# Patient Record
Sex: Female | Born: 1985 | Race: Black or African American | Hispanic: No | Marital: Married | Smoking: Never smoker
Health system: Southern US, Community
[De-identification: ages and names within clinical notes are randomized; demographics above are authoritative.]

## PROBLEM LIST (undated history)

## (undated) ENCOUNTER — Inpatient Hospital Stay (HOSPITAL_COMMUNITY): Payer: Self-pay

## (undated) ENCOUNTER — Inpatient Hospital Stay (HOSPITAL_COMMUNITY): Admission: RE | Payer: 59 | Source: Ambulatory Visit

## (undated) DIAGNOSIS — R569 Unspecified convulsions: Secondary | ICD-10-CM

## (undated) DIAGNOSIS — R87629 Unspecified abnormal cytological findings in specimens from vagina: Secondary | ICD-10-CM

## (undated) DIAGNOSIS — T7840XA Allergy, unspecified, initial encounter: Secondary | ICD-10-CM

## (undated) DIAGNOSIS — G43909 Migraine, unspecified, not intractable, without status migrainosus: Secondary | ICD-10-CM

## (undated) DIAGNOSIS — Q282 Arteriovenous malformation of cerebral vessels: Secondary | ICD-10-CM

## (undated) HISTORY — DX: Allergy, unspecified, initial encounter: T78.40XA

## (undated) HISTORY — DX: Migraine, unspecified, not intractable, without status migrainosus: G43.909

## (undated) HISTORY — DX: Unspecified convulsions: R56.9

## (undated) HISTORY — DX: Unspecified abnormal cytological findings in specimens from vagina: R87.629

---
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
## 2007-11-06 ENCOUNTER — Ambulatory Visit (HOSPITAL_COMMUNITY): Admission: RE | Admit: 2007-11-06 | Discharge: 2007-11-06 | Payer: Self-pay | Admitting: Obstetrics

## 2008-03-14 ENCOUNTER — Inpatient Hospital Stay (HOSPITAL_COMMUNITY): Admission: AD | Admit: 2008-03-14 | Discharge: 2008-03-14 | Payer: Self-pay | Admitting: Obstetrics & Gynecology

## 2008-03-19 ENCOUNTER — Inpatient Hospital Stay (HOSPITAL_COMMUNITY): Admission: AD | Admit: 2008-03-19 | Discharge: 2008-03-19 | Payer: Self-pay | Admitting: Obstetrics

## 2008-04-06 ENCOUNTER — Inpatient Hospital Stay (HOSPITAL_COMMUNITY): Admission: AD | Admit: 2008-04-06 | Discharge: 2008-04-06 | Payer: Self-pay | Admitting: Obstetrics & Gynecology

## 2008-04-06 ENCOUNTER — Inpatient Hospital Stay (HOSPITAL_COMMUNITY): Admission: AD | Admit: 2008-04-06 | Discharge: 2008-04-10 | Payer: Self-pay | Admitting: Obstetrics

## 2008-04-06 ENCOUNTER — Inpatient Hospital Stay (HOSPITAL_COMMUNITY): Admission: AD | Admit: 2008-04-06 | Discharge: 2008-04-06 | Payer: Self-pay | Admitting: Obstetrics

## 2008-04-07 ENCOUNTER — Encounter: Payer: Self-pay | Admitting: Obstetrics

## 2009-05-20 ENCOUNTER — Emergency Department (HOSPITAL_COMMUNITY): Admission: EM | Admit: 2009-05-20 | Discharge: 2009-05-20 | Payer: Self-pay | Admitting: Family Medicine

## 2009-07-10 LAB — HM DIABETES EYE EXAM

## 2010-07-10 HISTORY — PX: TOOTH EXTRACTION: SUR596

## 2010-11-22 NOTE — Discharge Summary (Signed)
NAMEBRIGITTA, Sherry Sheppard            ACCOUNT NO.:  [id number]   MEDICAL RECORD NO.:  [id number]          PATIENT TYPE:  INP   LOCATION:  9126                          FACILITY:  [facility]   PHYSICIAN:  Charles A. Clearance Coots, M.D.DATE OF BIRTH:  [birth year]   DATE OF ADMISSION:  04/06/2008  DATE OF DISCHARGE:  04/10/2008                               DISCHARGE SUMMARY   ADMITTING DIAGNOSIS:  Thirty-nine weeks' gestation, active labor.   DISCHARGE DIAGNOSES:  1. Thirty-nine weeks' gestation, active labor.  2. Status post primary low transverse cesarean section on April 07, 2008, for arrest of descent.  Viable female infant was delivered      at 1401, Apgars of 2 at 1 minute and 9 at 5 minutes, weight of 3210      g, and length of 49.53 cm.  Mother and infant discharged to home in      good condition.   REASON FOR ADMISSION:  A 25 year old G1 black female, estimated date of  confinement of April 08, 2008, presents with uterine contractions.  Prenatal care was uncomplicated.   PAST MEDICAL HISTORY:  Surgery:  None.  Illnesses:  None.   MEDICATIONS:  Prenatal vitamins.   ALLERGIES:  No known drug allergies.   SOCIAL HISTORY:  Single.  Negative tobacco, alcohol, or recreational  drug use.   FAMILY HISTORY:  Positive for diabetes and hypertension.   PHYSICAL EXAMINATION:  GENERAL:  Well-nourished, well-developed female  in no acute distress.  LUNGS:  Clear to auscultation bilaterally.  HEART:  Regular rate and rhythm.  ABDOMEN:  Gravid, nontender.  Cervix 3 cm dilated, 100% effaced, and  vertex at -2 station.   ADMITTING LABORATORY DATA:  Hemoglobin 12.3, hematocrit 37.9, white  blood cell count 14,200, platelets 281,000.  RPR is nonreactive.   HOSPITAL COURSE:  The patient was admitted and progressed to 7-cm  dilatation with an increasingly swollen anterior lip of the cervix.  There was no further descent of the vertex for greater than 3 hours  after being in a zero station.   Decision was made to proceed with  cesarean section delivery for arrest of descent.  Primary low transverse  cesarean section was performed on April 08, 1999.  There were no  intraoperative complications.  Postoperative and postpartum course was  uncomplicated.  The patient was discharged to home on postop day #3 in  good condition.   DISCHARGE LABORATORY DATA:  Hemoglobin 9.3, hematocrit 28.7, white blood  cell count 20,000, platelets 221,000.   DISCHARGE DISPOSITION:  Medications; continue prenatal vitamins.  Ibuprofen and Percocet was prescribed for pain.  Routine written  instructions were given for discharge after cesarean section.  The  patient is to call office for followup appointment in 2 weeks.      Charles A. Clearance Coots, M.D.  Electronically Signed     CAH/MEDQ  D:  04/10/2008  T:  04/10/2008  Job:  [id number]

## 2010-11-22 NOTE — Op Note (Signed)
NAMEANNALIZA, ZIA            ACCOUNT NO.:  [id number]   MEDICAL RECORD NO.:  [id number]          PATIENT TYPE:  INP   LOCATION:                                FACILITY:  [facility]   PHYSICIAN:  Charles A. Clearance Coots, M.D.DATE OF BIRTH:  04-09-1986   DATE OF PROCEDURE:  04/07/2008  DATE OF DISCHARGE:                               OPERATIVE REPORT   PREOPERATIVE DIAGNOSIS:  Arrest of descent.   POSTOPERATIVE DIAGNOSIS:  Arrest of descent.   PROCEDURE:  Primary low transverse cesarean section.   SURGEON:  Charles A. Clearance Coots, MD   ASSISTANT:  Corey Skains, surgical technician.   ANESTHESIA:  Epidural.   ESTIMATED BLOOD LOSS:  800 mL.   IV FLUIDS:  2000 mL.   URINE OUTPUT:  400 mL clear.   COMPLICATIONS:  None.   Foley to gravity.   FINDINGS:  Viable female at 1401, Apgars of 2 at 1 minute and 9 at 5  minutes, weight of 7 pounds and 1 ounce.  Normal uterus, ovaries, and  fallopian tube.  Cord pH of 7.21.   SPECIMEN:  Placenta to pathology.   OPERATION:  The patient was brought to the operating room, and after  satisfactory re-dosing of the epidural, the abdomen was prepped and  draped in the usual sterile fashion.  Pfannenstiel skin incision was  made with a scalpel that was deepened down to the fascia with a scalpel.  The fascia was nicked in the midline, and the fascial incision was  extended to the left and to the right with curved Mayo scissors.  The  superior and inferior fascial edges were taken off the rectus muscle  with blunt and sharp dissection.  The rectus muscle was bluntly divided  in the midline.  Peritoneum was entered digitally and was digitally  extended to the left and to the right.  The bladder blade was positioned  in the incision.  The vesicouterine fold of peritoneum above the  reflection of the urinary bladder was grasped with forceps and was  incised and undermined with Metzenbaum scissors.  The incision was  extended to the left and to the right with  the Metzenbaum scissors.  The  bladder flap was bluntly developed, and the bladder blade was  repositioned in front of the urinary bladder placing it well out of the  operative field.  The uterus was then entered transversely in the lower  uterine segment with the scalpel.  The uterine incision was extended to  the left and to the right with the digital traction.  The vertex was  noted to be left occiput posterior, and with assistance from the nurse  pushing from below, the occiput was rotated to an anterior position, and  the vertex was then delivered with the aid of fundal pressure from the  assistant.  The infant's mouth and nose were suctioned with a suction  bulb, and the delivery was completed with the aid of fundal pressure  from the assistant.  The umbilical cord was doubly clamped and cut, and  the infant was handed off to the nursery staff.  Cord pH was  obtained.  Placenta was then manually removed from the uterus intact.  The  endometrial surface was thoroughly debrided with a dry lap sponge.  The  edges of the uterine incision were grasped with ring forceps, and the  uterus was closed with a continuous interlocking suture of 0 Monocryl  from each corner to the center.  Hemostasis was excellent.  The pelvic  cavity was then thoroughly irrigated with warm saline solution and all  clots were removed.  The abdomen was then closed as follows:  The  parietal peritoneum was closed with continuous suture of 2-0 Monocryl.  The fascia was closed with a continuous suture of 0 Vicryl.  Subcutaneous tissue was thoroughly irrigated with warm saline solution.  All areas of subcutaneous bleeding were coagulated with the Bovie.  The  skin was then approximated with surgical stainless steel staples.  Sterile bandage was applied to the incision closure.  Surgical  technician indicated that all needle, sponge, and instrument counts were  correct x2.  The patient tolerated the procedure well, was  transported  to recovery in satisfactory condition.      Charles A. Clearance Coots, M.D.  Electronically Signed     CAH/MEDQ  D:  04/07/2008  T:  04/08/2008  Job:  [id number]

## 2011-04-10 LAB — CBC
Hemoglobin: 9.3 — ABNORMAL LOW
Platelets: 281
RBC: 3.33 — ABNORMAL LOW
RDW: 13.5
WBC: 14.2 — ABNORMAL HIGH
WBC: 20.1 — ABNORMAL HIGH

## 2011-04-10 LAB — RPR: RPR Ser Ql: NONREACTIVE

## 2011-06-08 LAB — HM PAP SMEAR: HM Pap smear: NORMAL

## 2011-06-13 ENCOUNTER — Ambulatory Visit: Payer: Self-pay | Admitting: Internal Medicine

## 2011-06-29 ENCOUNTER — Other Ambulatory Visit (INDEPENDENT_AMBULATORY_CARE_PROVIDER_SITE_OTHER): Payer: 59

## 2011-06-29 ENCOUNTER — Ambulatory Visit (INDEPENDENT_AMBULATORY_CARE_PROVIDER_SITE_OTHER): Payer: 59 | Admitting: Internal Medicine

## 2011-06-29 ENCOUNTER — Encounter: Payer: Self-pay | Admitting: Internal Medicine

## 2011-06-29 VITALS — BP 98/72 | HR 79 | Temp 98.7°F | Ht 65.0 in | Wt 121.4 lb

## 2011-06-29 DIAGNOSIS — Z0000 Encounter for general adult medical examination without abnormal findings: Secondary | ICD-10-CM

## 2011-06-29 DIAGNOSIS — Z23 Encounter for immunization: Secondary | ICD-10-CM

## 2011-06-29 LAB — HEPATIC FUNCTION PANEL
ALT: 25 U/L (ref 0–35)
AST: 28 U/L (ref 0–37)
Albumin: 4.1 g/dL (ref 3.5–5.2)
Alkaline Phosphatase: 51 U/L (ref 39–117)
Total Bilirubin: 1.1 mg/dL (ref 0.3–1.2)

## 2011-06-29 LAB — URINALYSIS
Bilirubin Urine: NEGATIVE
Hgb urine dipstick: NEGATIVE
Ketones, ur: NEGATIVE
Total Protein, Urine: NEGATIVE
Urine Glucose: NEGATIVE

## 2011-06-29 LAB — CBC WITH DIFFERENTIAL/PLATELET
Basophils Relative: 0.4 % (ref 0.0–3.0)
Eosinophils Relative: 3.1 % (ref 0.0–5.0)
HCT: 38.2 % (ref 36.0–46.0)
Lymphs Abs: 3 10*3/uL (ref 0.7–4.0)
MCV: 85.4 fl (ref 78.0–100.0)
Monocytes Absolute: 0.3 10*3/uL (ref 0.1–1.0)
Platelets: 300 10*3/uL (ref 150.0–400.0)
RBC: 4.47 Mil/uL (ref 3.87–5.11)
WBC: 7.3 10*3/uL (ref 4.5–10.5)

## 2011-06-29 LAB — TSH: TSH: 0.65 u[IU]/mL (ref 0.35–5.50)

## 2011-06-29 LAB — BASIC METABOLIC PANEL
CO2: 28 mEq/L (ref 19–32)
Glucose, Bld: 77 mg/dL (ref 70–99)
Sodium: 140 mEq/L (ref 135–145)

## 2011-06-29 LAB — LIPID PANEL
HDL: 78.3 mg/dL (ref >39.00)
Triglycerides: 54 mg/dL (ref 0.0–149.0)

## 2011-06-29 NOTE — Progress Notes (Signed)
  Subjective:    Patient ID: Sherry Sheppard, female    DOB: 1985/09/16, 25 y.o.   MRN: [id number]  HPI New pt to me, here to establish care; Also patient is here today for annual physical. Patient feels well and has no complaints.  Past Medical History  Diagnosis Date  . Allergy   . UTI (urinary tract infection)    Family History  Problem Relation Age of Onset  . Hyperlipidemia Mother   . Lung cancer Maternal Grandmother   . Hypertension Other   . Diabetes Other    History  Substance Use Topics  . Smoking status: Never Smoker   . Smokeless tobacco: Not on file  . Alcohol Use: No    Review of Systems Constitutional: Negative for fever or weight change.  Respiratory: Negative for cough and shortness of breath.   Cardiovascular: Negative for chest pain or palpitations.  Gastrointestinal: Negative for abdominal pain, no bowel changes.  Musculoskeletal: Negative for gait problem or joint swelling.  Skin: Negative for rash.  Neurological: Negative for dizziness or headache.  No other specific complaints in a complete review of systems (except as listed in HPI above).    Objective:   Physical Exam BP 98/72  Pulse 79  Temp(Src) 98.7 F (37.1 C) (Oral)  Ht 5\' 5"  (1.651 m)  Wt 121 lb 6.4 oz (55.067 kg)  BMI 20.20 kg/m2  SpO2 98% Wt Readings from Last 3 Encounters:  06/29/11 121 lb 6.4 oz (55.067 kg)   Constitutional: She appears well-developed and well-nourished. No distress.  HENT: Head: Normocephalic and atraumatic. Ears: B TMs ok, no erythema or effusion; Nose: Nose normal.  Mouth/Throat: Oropharynx is clear and moist. No oropharyngeal exudate.  Eyes: Conjunctivae and EOM are normal. Pupils are equal, round, and reactive to light. No scleral icterus.  Neck: Normal range of motion. Neck supple. No JVD present. No thyromegaly present.  Cardiovascular: Normal rate, regular rhythm and normal heart sounds.  No murmur heard. No BLE edema. Pulmonary/Chest: Effort normal and  breath sounds normal. No respiratory distress. She has no wheezes.  Abdominal: Soft. Bowel sounds are normal. She exhibits no distension. There is no tenderness. no masses Musculoskeletal: Normal range of motion, no joint effusions. No gross deformities Neurological: She is alert and oriented to person, place, and time. No cranial nerve deficit. Coordination normal.  Skin: Skin is warm and dry. No rash noted. No erythema.  Psychiatric: She has a normal mood and affect. Her behavior is normal. Judgment and thought content normal.   Lab Results  Component Value Date   WBC 20.1* 04/08/2008   HGB 9.3 DELTA CHECK NOTED* 04/08/2008   HCT 28.7* 04/08/2008   PLT 221 04/08/2008       Assessment & Plan:  CPX - v70.0 - Patient has been counseled on age-appropriate routine health concerns for screening and prevention. These are reviewed and up-to-date. Immunizations are up-to-date or declined. Labs ordered and will be reviewed.

## 2011-06-29 NOTE — Patient Instructions (Addendum)
It was good to see you today. We have reviewed your prior medical history today Health Maintenance reviewed - up to date after Gardisil.  Test(s) ordered today. Your results will be called to you after review (48-72hours after test completion). If any changes need to be made, you will be notified at that time. Start Gardisil series today - 1st of 3 shots done Please schedule followup in 1-2 years, call sooner if problems.

## 2011-08-28 ENCOUNTER — Ambulatory Visit: Payer: 59

## 2011-09-13 ENCOUNTER — Other Ambulatory Visit: Payer: Self-pay | Admitting: Obstetrics

## 2011-09-13 ENCOUNTER — Encounter (HOSPITAL_COMMUNITY): Payer: Self-pay

## 2011-09-13 ENCOUNTER — Ambulatory Visit (HOSPITAL_COMMUNITY)
Admission: RE | Admit: 2011-09-13 | Discharge: 2011-09-13 | Disposition: A | Discharge status: Home / Self Care | Payer: 59 | Source: Ambulatory Visit | Attending: Obstetrics | Admitting: Obstetrics

## 2011-09-13 DIAGNOSIS — O3680X0 Pregnancy with inconclusive fetal viability, not applicable or unspecified: Secondary | ICD-10-CM

## 2011-09-13 DIAGNOSIS — O368390 Maternal care for abnormalities of the fetal heart rate or rhythm, unspecified trimester, not applicable or unspecified: Secondary | ICD-10-CM | POA: Insufficient documentation

## 2011-09-14 ENCOUNTER — Ambulatory Visit (HOSPITAL_COMMUNITY): Payer: 59

## 2011-09-19 ENCOUNTER — Encounter (HOSPITAL_COMMUNITY): Payer: Self-pay | Admitting: *Deleted

## 2011-09-19 ENCOUNTER — Other Ambulatory Visit: Payer: Self-pay | Admitting: Obstetrics

## 2011-09-19 MED ORDER — DOXYCYCLINE HYCLATE 100 MG IV SOLR: 200.0000 mg | Freq: Once | INTRAVENOUS | Status: DC

## 2011-09-20 ENCOUNTER — Encounter (HOSPITAL_COMMUNITY)
Admission: AD | Disposition: A | Discharge status: Home / Self Care | Payer: Self-pay | Source: Ambulatory Visit | Attending: Obstetrics

## 2011-09-20 ENCOUNTER — Encounter (HOSPITAL_COMMUNITY): Payer: Self-pay | Admitting: Anesthesiology

## 2011-09-20 ENCOUNTER — Inpatient Hospital Stay (HOSPITAL_COMMUNITY): Payer: 59 | Admitting: Anesthesiology

## 2011-09-20 ENCOUNTER — Encounter (HOSPITAL_COMMUNITY): Payer: Self-pay | Admitting: *Deleted

## 2011-09-20 ENCOUNTER — Ambulatory Visit (HOSPITAL_COMMUNITY)
Admission: AD | Admit: 2011-09-20 | Discharge: 2011-09-20 | DRG: 770 | Disposition: A | Discharge status: Home / Self Care | Payer: 59 | Source: Ambulatory Visit | Attending: Obstetrics | Admitting: Obstetrics

## 2011-09-20 ENCOUNTER — Ambulatory Visit (HOSPITAL_COMMUNITY): Admission: RE | Admit: 2011-09-20 | Payer: 59 | Source: Ambulatory Visit | Admitting: Obstetrics & Gynecology

## 2011-09-20 DIAGNOSIS — O021 Missed abortion: Secondary | ICD-10-CM | POA: Diagnosis present

## 2011-09-20 HISTORY — PX: DILATION AND EVACUATION: SHX1459

## 2011-09-20 LAB — ABO/RH: ABO/RH(D): A POS

## 2011-09-20 LAB — CBC
Hemoglobin: 12 g/dL (ref 12.0–15.0)
MCH: 27.6 pg (ref 26.0–34.0)
RBC: 4.35 MIL/uL (ref 3.87–5.11)
WBC: 14.1 10*3/uL — ABNORMAL HIGH (ref 4.0–10.5)

## 2011-09-20 SURGERY — DILATION AND EVACUATION, UTERUS
Anesthesia: General | Site: Uterus | Wound class: Clean Contaminated

## 2011-09-20 MED ORDER — MIDAZOLAM HCL 2 MG/2ML IJ SOLN
INTRAMUSCULAR | Status: AC
  Filled 2011-09-20: qty 2

## 2011-09-20 MED ORDER — LIDOCAINE HCL 1 % IJ SOLN
INTRAMUSCULAR | Status: DC | PRN
  Administered 2011-09-20: 10 mL

## 2011-09-20 MED ORDER — LACTATED RINGERS IV SOLN
INTRAVENOUS | Status: DC
  Administered 2011-09-20: 125 mL/h via INTRAVENOUS
  Administered 2011-09-20: 12:00:00 via INTRAVENOUS

## 2011-09-20 MED ORDER — SUCCINYLCHOLINE CHLORIDE 20 MG/ML IJ SOLN
INTRAMUSCULAR | Status: DC | PRN
  Administered 2011-09-20: 120 mg via INTRAVENOUS

## 2011-09-20 MED ORDER — DEXAMETHASONE SODIUM PHOSPHATE 10 MG/ML IJ SOLN
INTRAMUSCULAR | Status: AC
  Filled 2011-09-20: qty 1

## 2011-09-20 MED ORDER — DEXAMETHASONE SODIUM PHOSPHATE 10 MG/ML IJ SOLN
INTRAMUSCULAR | Status: DC | PRN
  Administered 2011-09-20: 10 mg via INTRAVENOUS

## 2011-09-20 MED ORDER — FENTANYL CITRATE 0.05 MG/ML IJ SOLN
INTRAMUSCULAR | Status: AC
  Filled 2011-09-20: qty 2

## 2011-09-20 MED ORDER — HYDROMORPHONE HCL PF 1 MG/ML IJ SOLN
2.0000 mg | Freq: Once | INTRAMUSCULAR | Status: AC
  Administered 2011-09-20: 2 mg via INTRAVENOUS
  Filled 2011-09-20: qty 2

## 2011-09-20 MED ORDER — METHYLERGONOVINE MALEATE 0.2 MG PO TABS: 0.2000 mg | ORAL_TABLET | Freq: Four times a day (QID) | ORAL | Status: DC

## 2011-09-20 MED ORDER — MEPERIDINE HCL 25 MG/ML IJ SOLN: 6.2500 mg | INTRAMUSCULAR | Status: DC | PRN

## 2011-09-20 MED ORDER — CEFAZOLIN SODIUM 1-5 GM-% IV SOLN
INTRAVENOUS | Status: DC | PRN
  Administered 2011-09-20: 2 g via INTRAVENOUS

## 2011-09-20 MED ORDER — MIDAZOLAM HCL 5 MG/5ML IJ SOLN
INTRAMUSCULAR | Status: DC | PRN
  Administered 2011-09-20: 2 mg via INTRAVENOUS

## 2011-09-20 MED ORDER — FENTANYL CITRATE 0.05 MG/ML IJ SOLN
INTRAMUSCULAR | Status: AC
Start: 2011-09-20 — End: 2011-09-20
  Filled 2011-09-20: qty 10

## 2011-09-20 MED ORDER — SODIUM BICARBONATE 8.4 % IV SOLN
INTRAVENOUS | Status: DC | PRN
  Administered 2011-09-20: 2 mL via INTRAVENOUS

## 2011-09-20 MED ORDER — PROPOFOL 10 MG/ML IV EMUL
INTRAVENOUS | Status: AC
  Filled 2011-09-20: qty 40

## 2011-09-20 MED ORDER — KETOROLAC TROMETHAMINE 30 MG/ML IJ SOLN: 15.0000 mg | Freq: Once | INTRAMUSCULAR | Status: DC | PRN

## 2011-09-20 MED ORDER — ONDANSETRON HCL 4 MG/2ML IJ SOLN
INTRAMUSCULAR | Status: AC
  Filled 2011-09-20: qty 2

## 2011-09-20 MED ORDER — CITRIC ACID-SODIUM CITRATE 334-500 MG/5ML PO SOLN
30.0000 mL | Freq: Once | ORAL | Status: AC
  Administered 2011-09-20: 30 mL via ORAL
  Filled 2011-09-20: qty 15

## 2011-09-20 MED ORDER — LIDOCAINE HCL (CARDIAC) 20 MG/ML IV SOLN
INTRAVENOUS | Status: DC | PRN
  Administered 2011-09-20: 40 mg via INTRAVENOUS

## 2011-09-20 MED ORDER — HYDROCODONE-ACETAMINOPHEN 7.5-750 MG PO TABS: 1.0000 | ORAL_TABLET | Freq: Four times a day (QID) | ORAL | Status: DC | PRN

## 2011-09-20 MED ORDER — OXYTOCIN 20 UNITS IN LACTATED RINGERS INFUSION - SIMPLE
INTRAVENOUS | Status: DC | PRN
  Administered 2011-09-20: 40 [IU] via INTRAVENOUS

## 2011-09-20 MED ORDER — IBUPROFEN 800 MG PO TABS: 800.0000 mg | ORAL_TABLET | Freq: Three times a day (TID) | ORAL | Status: AC | PRN

## 2011-09-20 MED ORDER — HYDROMORPHONE HCL PF 1 MG/ML IJ SOLN
1.0000 mg | Freq: Once | INTRAMUSCULAR | Status: AC
  Administered 2011-09-20: 1 mg via INTRAVENOUS
  Filled 2011-09-20: qty 1

## 2011-09-20 MED ORDER — KETOROLAC TROMETHAMINE 60 MG/2ML IM SOLN
INTRAMUSCULAR | Status: AC
  Filled 2011-09-20: qty 2

## 2011-09-20 MED ORDER — FAMOTIDINE IN NACL 20-0.9 MG/50ML-% IV SOLN
20.0000 mg | Freq: Once | INTRAVENOUS | Status: AC
  Administered 2011-09-20: 20 mg via INTRAVENOUS
  Filled 2011-09-20: qty 50

## 2011-09-20 MED ORDER — PROMETHAZINE HCL 25 MG/ML IJ SOLN: 6.2500 mg | INTRAMUSCULAR | Status: DC | PRN

## 2011-09-20 MED ORDER — METHYLERGONOVINE MALEATE 0.2 MG/ML IJ SOLN
INTRAMUSCULAR | Status: DC | PRN
  Administered 2011-09-20: 0.2 mg via INTRAMUSCULAR

## 2011-09-20 MED ORDER — FENTANYL CITRATE 0.05 MG/ML IJ SOLN: 25.0000 ug | INTRAMUSCULAR | Status: DC | PRN

## 2011-09-20 MED ORDER — FENTANYL CITRATE 0.05 MG/ML IJ SOLN
INTRAMUSCULAR | Status: DC | PRN
  Administered 2011-09-20: 50 ug via INTRAVENOUS

## 2011-09-20 MED ORDER — PROPOFOL 10 MG/ML IV EMUL
INTRAVENOUS | Status: DC | PRN
  Administered 2011-09-20: 200 mg via INTRAVENOUS

## 2011-09-20 MED ORDER — LIDOCAINE HCL (CARDIAC) 20 MG/ML IV SOLN
INTRAVENOUS | Status: AC
  Filled 2011-09-20: qty 5

## 2011-09-20 MED ORDER — ONDANSETRON HCL 4 MG/2ML IJ SOLN
INTRAMUSCULAR | Status: DC | PRN
  Administered 2011-09-20: 4 mg via INTRAVENOUS

## 2011-09-20 SURGICAL SUPPLY — 22 items
CATH ROBINSON RED A/P 16FR (CATHETERS) ×2 IMPLANT
CLOTH BEACON ORANGE TIMEOUT ST (SAFETY) ×2 IMPLANT
DECANTER SPIKE VIAL GLASS SM (MISCELLANEOUS) ×1 IMPLANT
GLOVE BIO SURGEON STRL SZ8 (GLOVE) ×4 IMPLANT
GOWN PREVENTION PLUS LG XLONG (DISPOSABLE) ×4 IMPLANT
KIT BERKELEY 1ST TRIMESTER 3/8 (MISCELLANEOUS) ×2 IMPLANT
NDL HYPO 25X1 1.5 SAFETY (NEEDLE) ×1 IMPLANT
NDL SPNL 22GX3.5 QUINCKE BK (NEEDLE) ×1 IMPLANT
NEEDLE HYPO 25X1 1.5 SAFETY (NEEDLE) ×4 IMPLANT
NEEDLE SPNL 22GX3.5 QUINCKE BK (NEEDLE) ×2 IMPLANT
NS IRRIG 1000ML POUR BTL (IV SOLUTION) ×2 IMPLANT
PACK VAGINAL MINOR WOMEN LF (CUSTOM PROCEDURE TRAY) ×2 IMPLANT
PAD PREP 24X48 CUFFED NSTRL (MISCELLANEOUS) ×2 IMPLANT
SET BERKELEY SUCTION TUBING (SUCTIONS) ×2 IMPLANT
SYR 5ML LL (SYRINGE) ×1 IMPLANT
SYR CONTROL 10ML LL (SYRINGE) ×2 IMPLANT
TOWEL OR 17X24 6PK STRL BLUE (TOWEL DISPOSABLE) ×4 IMPLANT
VACURETTE 10 RIGID CVD (CANNULA) IMPLANT
VACURETTE 12 RIGID CVD (CANNULA) ×1 IMPLANT
VACURETTE 7MM CVD STRL WRAP (CANNULA) IMPLANT
VACURETTE 8 RIGID CVD (CANNULA) ×1 IMPLANT
VACURETTE 9 RIGID CVD (CANNULA) IMPLANT

## 2011-09-20 NOTE — Anesthesia Preprocedure Evaluation (Signed)
Anesthesia Evaluation  Patient identified by MRN, date of birth, ID band Patient awake    Reviewed: Allergy & Precautions, H&P , NPO status , Patient's Chart, lab work & pertinent test results  Airway Mallampati: I TM Distance: >3 FB Neck ROM: full    Dental No notable dental hx. (+) Teeth Intact   Pulmonary    Pulmonary exam normal       Cardiovascular negative cardio ROS      Neuro/Psych negative neurological ROS  negative psych ROS   GI/Hepatic negative GI ROS, Neg liver ROS,   Endo/Other  negative endocrine ROS  Renal/GU negative Renal ROS  negative genitourinary   Musculoskeletal negative musculoskeletal ROS (+)   Abdominal Normal abdominal exam  (+)   Peds negative pediatric ROS (+)  Hematology negative hematology ROS (+)   Anesthesia Other Findings   Reproductive/Obstetrics (+) Pregnancy                           Anesthesia Physical Anesthesia Plan  ASA: II  Anesthesia Plan: General   Post-op Pain Management:    Induction: Intravenous, Rapid sequence and Cricoid pressure planned  Airway Management Planned: Oral ETT  Additional Equipment:   Intra-op Plan:   Post-operative Plan: Extubation in OR  Informed Consent: I have reviewed the patients History and Physical, chart, labs and discussed the procedure including the risks, benefits and alternatives for the proposed anesthesia with the patient or authorized representative who has indicated his/her understanding and acceptance.   Dental advisory given  Plan Discussed with: CRNA and Surgeon  Anesthesia Plan Comments:         Anesthesia Quick Evaluation

## 2011-09-20 NOTE — Op Note (Signed)
Preoperative diagnosis:Missed Abortion, 1st Trimester  Postoperative diagnosis: same  Procedure: Suction dilatation and curretage Surgeon: Renard Caperton A  Anesthesia:Mac with paracervical block of 1% Xylocaine  Estimated blood loss:  Urine output: 50ml  IV Fluids:  Complications: None  Specimen: POC to Pathology.  Operative Findings: POC in dilated cervical os.  Description of procedure:   The patient was taken to the operating room and placed on the operating table in the semi-lithotomy position in Milroy stirrups.  Examination under anesthesia was performed.  The patient was prepped and draped in the usual manner.  After a time-out had been completed, a speculum was placed in the vagina.  The anterior lip of the cervix was grasped with a single-toothed tenaculum.  A paracervical block was performed using 20 ml of 1% lidocaine.  The block was performed at 4 and 8 o'clock at the cervical vaginal junction. The cervix was dilated with Shawnie Pons dilators.  A 8 -mm suction curet was inserted in the uterine cavity.  The device was activated and the curet rotated to evacuate the products of conception.   All the instruments were removed from the vagina.  Final instrument counts were correct.  The patient was taken to the PACU in stable condition.

## 2011-09-20 NOTE — H&P (Signed)
Sherry Sheppard is an 26 y.o. female. Presents for D&E for missed abortion.  Pertinent Gynecological History: Menses: regular every month without intermenstrual spotting Bleeding: none Contraception: none DES exposure: denies Blood transfusions: none Sexually transmitted diseases: no past history Previous GYN Procedures: none  Last mammogram: n/a Date: n/a Last pap: normal Date: 2012 OB History: G2, P1   Menstrual History: Menarche age: 31 No LMP recorded. Patient is pregnant.    Past Medical History  Diagnosis Date  . Allergic rhinitis   . UTI (urinary tract infection) 04/2011  . Recurrent upper respiratory infection (URI)     getting old cold sx - started 09/14/10  . Anemia     history     Past Surgical History  Procedure Date  . Cesarean section 2009  . Tooth extraction 2012    Family History  Problem Relation Age of Onset  . Hyperlipidemia Mother   . Lung cancer Maternal Grandmother 31  . Hypertension Other   . Diabetes Cousin     Social History:  reports that she has never smoked. She has never used smokeless tobacco. She reports that she does not drink alcohol or use illicit drugs.  Allergies: No Known Allergies  Prescriptions prior to admission  Medication Sig Dispense Refill  . Prenatal Vit-Fe Fumarate-FA (PRENATAL MULTIVITAMIN) TABS Take 1 tablet by mouth daily.        Review of Systems  Gastrointestinal: Positive for abdominal pain.  All other systems reviewed and are negative.    Blood pressure 99/84, pulse 85, temperature 97.1 F (36.2 C), temperature source Oral, resp. rate 18, SpO2 100.00%. Physical Exam  Nursing note and vitals reviewed. Constitutional: She is oriented to person, place, and time. She appears well-developed and well-nourished.  HENT:  Head: Normocephalic and atraumatic.  Eyes: Pupils are equal, round, and reactive to light.  Neck: Normal range of motion. Neck supple.  Cardiovascular: Normal rate and regular rhythm.     Respiratory: Effort normal and breath sounds normal.  GI: Soft.  Genitourinary: Vagina normal and uterus normal.  Musculoskeletal: Normal range of motion.  Neurological: She is alert and oriented to person, place, and time. She has normal reflexes.  Skin: Skin is warm and dry.  Psychiatric: She has a normal mood and affect. Her behavior is normal. Judgment and thought content normal.  Patient does have moderate vaginal bleeding and could not examine because of extreme abdominal pain.  Results for orders placed during the hospital encounter of 09/20/11 (from the past 24 hour(s))  CBC     Status: Abnormal   Collection Time   09/20/11 11:25 AM      Component Value Range   WBC 14.1 (*) 4.0 - 10.5 (K/uL)   RBC 4.35  3.87 - 5.11 (MIL/uL)   Hemoglobin 12.0  12.0 - 15.0 (g/dL)   HCT 40.9  81.1 - 91.4 (%)   MCV 85.3  78.0 - 100.0 (fL)   MCH 27.6  26.0 - 34.0 (pg)   MCHC 32.3  30.0 - 36.0 (g/dL)   RDW 78.2  95.6 - 21.3 (%)   Platelets 276  150 - 400 (K/uL)  ABO/RH     Status: Normal   Collection Time   09/20/11 11:25 AM      Component Value Range   ABO/RH(D) A POS      No results found.  Assessment/Plan: 8 week anembryonic intrauterine gestational sac on ultrasound.  Suction D&E for Missed Abortion. Muhammadali Ries A 09/20/2011, 1:03 PM

## 2011-09-20 NOTE — Discharge Instructions (Signed)
As per routine instructions for D&E.     DISCHARGE INSTRUCTIONS: D&C / D&E The following instructions have been prepared to help you care for yourself upon your return home.   Personal hygiene: Marland Kitchen Use sanitary pads for vaginal drainage, not tampons. . Shower the day after your procedure. . NO tub baths, pools or Jacuzzis for 2-3 weeks. . Wipe front to back after using the bathroom.  Activity and limitations: . Do NOT drive or operate any equipment for 24 hours. The effects of anesthesia are still present and drowsiness may result. . Do NOT rest in bed all day. . Walking is encouraged. . Walk up and down stairs slowly. . You may resume your normal activity in one to two days or as indicated by your physician.  Sexual activity: NO intercourse for at least 2 weeks after the procedure, or as indicated by your physician.  Diet: Eat a light meal as desired this evening. You may resume your usual diet tomorrow.  Return to work: You may resume your work activities in one to two days or as indicated by your doctor.  What to expect after your surgery: Expect to have vaginal bleeding/discharge for 2-3 days and spotting for up to 10 days. It is not unusual to have soreness for up to 1-2 weeks. You may have a slight burning sensation when you urinate for the first day. Mild cramps may continue for a couple of days. You may have a regular period in 2-6 weeks.  Call your doctor for any of the following: . Excessive vaginal bleeding, saturating and changing one pad every hour. . Inability to urinate 6 hours after discharge from hospital. . Pain not relieved by pain medication. . Fever of 100.4 F or greater. . Unusual vaginal discharge or odor.  Return to office ________________ Call for an appointment ___________________  Patient's signature: ______________________  Nurse's signature ________________________  Post Anesthesia Care Unit [phone]

## 2011-09-20 NOTE — Transfer of Care (Signed)
Immediate Anesthesia Transfer of Care Note  Patient: Sherry Sheppard  Procedure(s) Performed: Procedure(s) (LRB): DILATATION AND EVACUATION (N/A)  Patient Location: PACU  Anesthesia Type: General  Level of Consciousness: awake, alert  and oriented  Airway & Oxygen Therapy: Patient Spontanous Breathing and Patient connected to nasal cannula oxygen  Post-op Assessment: Report given to PACU RN and Post -op Vital signs reviewed and stable  Post vital signs: stable  Complications: No apparent anesthesia complications

## 2011-09-20 NOTE — Anesthesia Postprocedure Evaluation (Signed)
Anesthesia Post Note  Patient: Sherry Sheppard  Procedure(s) Performed: Procedure(s) (LRB): DILATATION AND EVACUATION (N/A)  Anesthesia type: GA  Patient location: PACU  Post pain: Pain level controlled  Post assessment: Post-op Vital signs reviewed  Last Vitals:  Filed Vitals:   09/20/11 1500  BP:   Pulse: 65  Temp: 36.6 C  Resp: 13    Post vital signs: Reviewed  Level of consciousness: sedated  Complications: No apparent anesthesia complications

## 2011-09-20 NOTE — MAU Note (Signed)
Pt sent from MD office for D&E for missed AB.

## 2011-09-21 ENCOUNTER — Encounter (HOSPITAL_COMMUNITY): Payer: Self-pay | Admitting: Obstetrics

## 2011-10-30 ENCOUNTER — Other Ambulatory Visit: Payer: Self-pay | Admitting: Obstetrics

## 2011-12-26 ENCOUNTER — Ambulatory Visit: Payer: 59

## 2012-01-08 ENCOUNTER — Inpatient Hospital Stay (HOSPITAL_COMMUNITY)
Admission: AD | Admit: 2012-01-08 | Discharge: 2012-01-08 | Disposition: A | Discharge status: Home / Self Care | Payer: 59 | Source: Ambulatory Visit | Attending: Obstetrics & Gynecology | Admitting: Obstetrics & Gynecology

## 2012-01-08 ENCOUNTER — Encounter (HOSPITAL_COMMUNITY): Payer: Self-pay

## 2012-01-08 DIAGNOSIS — O99891 Other specified diseases and conditions complicating pregnancy: Secondary | ICD-10-CM | POA: Insufficient documentation

## 2012-01-08 DIAGNOSIS — Z3201 Encounter for pregnancy test, result positive: Secondary | ICD-10-CM

## 2012-01-08 LAB — URINALYSIS, ROUTINE W REFLEX MICROSCOPIC
Bilirubin Urine: NEGATIVE
Glucose, UA: NEGATIVE mg/dL
Hgb urine dipstick: NEGATIVE
Ketones, ur: 15 mg/dL — AB
Protein, ur: NEGATIVE mg/dL
Urobilinogen, UA: 0.2 mg/dL (ref 0.0–1.0)

## 2012-01-08 NOTE — MAU Note (Signed)
Patient states she had a miscarriage recently is is anxious about this pregnancy. Patient states she is not having any problems just a "twinge" occasionally. No bleeding or discharge.

## 2012-01-08 NOTE — MAU Provider Note (Signed)
Sherry G Clark26 y.o.G4P1011 @[redacted]w[redacted]d  by LMP Chief Complaint  Patient presents with  . Possible Pregnancy     First Provider Initiated Contact with Patient 01/08/12 1129      SUBJECTIVE  HPI: He presents same she's worried about this pregnancy because she had a miscarriage in March 2013. She denies any vaginal bleeding or abdominal pain or cramps. She reports having  occasional" twinges" in her lower abdomen. She does have fatigue and nausea. She has an appointment for a new OB at [facility] in one week. She denies any irritative vaginal discharge, dysuria, urgency or frequency of urination.  Past Medical History  Diagnosis Date  . Allergic rhinitis   . UTI (urinary tract infection) 04/2011  . Recurrent upper respiratory infection (URI)     getting old cold sx - started 09/14/10  . Anemia     history    Past Surgical History  Procedure Date  . Cesarean section 2009  . Tooth extraction 2012  . Dilation and evacuation 09/20/2011    Procedure: DILATATION AND EVACUATION;  Surgeon: Sherry Bad, MD;  Location: [facility];  Service: Gynecology;  Laterality: N/A;   History   Social History  . Marital Status: Single    Spouse Name: N/A    Number of Children: N/A  . Years of Education: N/A   Occupational History  . Not on file.   Social History Main Topics  . Smoking status: Never Smoker   . Smokeless tobacco: Never Used  . Alcohol Use: No  . Drug Use: No  . Sexually Active: Yes     Pregnant - ? 12 wks per patient   Other Topics Concern  . 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 doxycycline (VIBRAMYCIN) 200 mg in dextrose 5 % 250 mL IVPB  200 mg Intravenous Once Sherry Bad, MD       Current Outpatient Prescriptions on File Prior to Encounter  Medication Sig  Dispense Refill  . Prenatal Vit-Fe Fumarate-FA (PRENATAL MULTIVITAMIN) TABS Take 1 tablet by mouth daily.       Allergies  Allergen Reactions  . Banana Shortness Of Breath    ROS: Pertinent items in HPI  OBJECTIVE Blood pressure 117/61, pulse 91, temperature 99.6 F (37.6 C), temperature source Oral, resp. rate 16, height 5' 2.5" (1.588 m), weight 60.419 kg (133 lb 3.2 oz), last menstrual period 11/29/2011, SpO2 100.00%, unknown if currently breastfeeding.  GENERAL: Well-developed, well-nourished female in no acute distress.  HEENT: Normocephalic, good dentition HEART: normal rate RESP: normal effort ABDOMEN: Soft, nontender EXTREMITIES: Nontender, no edema NEURO: Alert and oriented SPECULUM EXAM: NEFG, physiologic discharge, no blood noted, cervix clean BIMANUAL: cervix Long close high; uterus retroverted, maybe slightly enlarged; no adnexal tenderness or masses   LAB RESULTS  Results for orders placed during the hospital encounter of 01/08/12 (from the past 24 hour(s))  URINALYSIS, ROUTINE W REFLEX MICROSCOPIC     Status: Abnormal   Collection Time   01/08/12 10:47 AM      Component Value Range   Color, Urine YELLOW  YELLOW   APPearance CLEAR  CLEAR   Specific Gravity, Urine 1.020  1.005 - 1.030   pH 7.0  5.0 - 8.0   Glucose, UA NEGATIVE  NEGATIVE mg/dL   Hgb urine  dipstick NEGATIVE  NEGATIVE   Bilirubin Urine NEGATIVE  NEGATIVE   Ketones, ur 15 (*) NEGATIVE mg/dL   Protein, ur NEGATIVE  NEGATIVE mg/dL   Urobilinogen, UA 0.2  0.0 - 1.0 mg/dL   Nitrite NEGATIVE  NEGATIVE   Leukocytes, UA NEGATIVE  NEGATIVE  POCT PREGNANCY, URINE     Status: Abnormal   Collection Time   01/08/12 10:47 AM      Component Value Range   Preg Test, Ur POSITIVE (*) NEGATIVE       ASSESSMENT G3P1011 at [redacted]w[redacted]d 1. Pregnancy, confirmed, not first     PLAN  Medication List  As of 01/08/2012 11:29 AM   ASK your doctor about these medications         prenatal multivitamin Tabs   Take 1  tablet by mouth daily.           follow up: [facility] 01/16/12 Pregnancy verification letter given Pregnancy precautions reviewed and reassured pt.     Sherry Sheppard 01/08/2012 11:29 AM

## 2012-01-16 LAB — OB RESULTS CONSOLE RPR: RPR: NONREACTIVE

## 2012-01-16 LAB — OB RESULTS CONSOLE ANTIBODY SCREEN: Antibody Screen: NEGATIVE

## 2012-01-16 LAB — OB RESULTS CONSOLE HEPATITIS B SURFACE ANTIGEN: Hepatitis B Surface Ag: NEGATIVE

## 2012-06-15 ENCOUNTER — Inpatient Hospital Stay (HOSPITAL_COMMUNITY)
Admission: AD | Admit: 2012-06-15 | Discharge: 2012-06-15 | Disposition: A | Discharge status: Home / Self Care | Payer: 59 | Source: Ambulatory Visit | Attending: Obstetrics & Gynecology | Admitting: Obstetrics & Gynecology

## 2012-06-15 ENCOUNTER — Inpatient Hospital Stay (HOSPITAL_COMMUNITY): Payer: 59

## 2012-06-15 ENCOUNTER — Encounter (HOSPITAL_COMMUNITY): Payer: Self-pay | Admitting: *Deleted

## 2012-06-15 DIAGNOSIS — O4700 False labor before 37 completed weeks of gestation, unspecified trimester: Secondary | ICD-10-CM | POA: Insufficient documentation

## 2012-06-15 DIAGNOSIS — O99891 Other specified diseases and conditions complicating pregnancy: Secondary | ICD-10-CM | POA: Insufficient documentation

## 2012-06-15 DIAGNOSIS — R109 Unspecified abdominal pain: Secondary | ICD-10-CM | POA: Insufficient documentation

## 2012-06-15 LAB — URINALYSIS, ROUTINE W REFLEX MICROSCOPIC
Bilirubin Urine: NEGATIVE
Glucose, UA: NEGATIVE mg/dL
Ketones, ur: NEGATIVE mg/dL
Nitrite: NEGATIVE
Specific Gravity, Urine: 1.02 (ref 1.005–1.030)
pH: 7 (ref 5.0–8.0)

## 2012-06-15 MED ORDER — NIFEDIPINE 10 MG PO CAPS: 10.0000 mg | ORAL_CAPSULE | ORAL | Status: DC | PRN

## 2012-06-15 MED ORDER — GI COCKTAIL ~~LOC~~
30.0000 mL | Freq: Once | ORAL | Status: AC
  Administered 2012-06-15: 30 mL via ORAL
  Filled 2012-06-15: qty 30

## 2012-06-15 MED ORDER — NIFEDIPINE 10 MG PO CAPS
ORAL_CAPSULE | ORAL | Status: AC
  Administered 2012-06-15: 10 mg via ORAL
  Filled 2012-06-15: qty 1

## 2012-06-15 MED ORDER — NIFEDIPINE 10 MG PO CAPS
10.0000 mg | ORAL_CAPSULE | Freq: Once | ORAL | Status: AC
  Administered 2012-06-15 (×2): 10 mg via ORAL
  Filled 2012-06-15: qty 1

## 2012-06-15 NOTE — MAU Provider Note (Signed)
History     CSN: [id number]  Arrival date and time: 06/15/12 4098   First Provider Initiated Contact with Patient 06/15/12 1053      Chief Complaint  Patient presents with  . Abdominal Pain   HPI  Pt is [redacted]w[redacted]d pregnant who presents with upper abdominal pain that started after she ate this morning.  Pt also complains of diffused abdominal tightening for 1 1/2 weeks.  Pt denies spotting or bleeding or leakage of fluid.  Pt also has some lower back pain.  Pt denies UTI symptoms, constipation or diarrhea  Past Medical History  Diagnosis Date  . Allergic rhinitis   . UTI (urinary tract infection) 04/2011  . Recurrent upper respiratory infection (URI)     getting old cold sx - started 09/14/10  . Anemia     history     Past Surgical History  Procedure Date  . Cesarean section 2009  . Tooth extraction 2012  . Dilation and evacuation 09/20/2011    Procedure: DILATATION AND EVACUATION;  Surgeon: Brock Bad, MD;  Location: [facility];  Service: Gynecology;  Laterality: N/A;    Family History  Problem Relation Age of Onset  . Hyperlipidemia Mother   . Lung cancer Maternal Grandmother 69  . Hypertension Other   . Diabetes Cousin     History  Substance Use Topics  . Smoking status: Never Smoker   . Smokeless tobacco: Never Used  . Alcohol Use: No    Allergies:  Allergies  Allergen Reactions  . Banana Shortness Of Breath    Prescriptions prior to admission  Medication Sig Dispense Refill  . cholecalciferol (VITAMIN D) 1000 UNITS tablet Take 1,000 Units by mouth daily.      . Iron-FA-B Cmp-C-Biot-Probiotic (FUSION PLUS) CAPS Take 1 capsule by mouth every morning.      Marland Kitchen omeprazole (PRILOSEC) 20 MG capsule Take 20 mg by mouth 2 (two) times daily.      . Ondansetron (ZOFRAN ODT PO) Take 1 tablet by mouth every 6 (six) hours as needed. Nausea; pt does not know strength      . Prenatal Vit-Fe Fumarate-FA (PRENATAL MULTIVITAMIN) TABS Take 1 tablet by mouth daily.         ROS Physical Exam   Blood pressure 116/65, pulse 100, temperature 98.3 F (36.8 C), temperature source Oral, resp. rate 18, height 5\' 6"  (1.676 m), weight 133 lb 9.6 oz (60.601 kg), last menstrual period 11/29/2011.  Physical Exam  Constitutional: She appears well-developed and well-nourished.  HENT:  Head: Normocephalic.  Eyes: Pupils are equal, round, and reactive to light.  Neck: Normal range of motion.  GI:       Ctx Q 5-6 minutes   Genitourinary:       Cervix closed 30-40% effaced, soft  Neurological: She is alert.  Skin: Skin is warm and dry.  Psychiatric: She has a normal mood and affect.    MAU Course  Procedures Ctx ~6-7 minutes and uncomfortable Discussed with Dr. Tamela Oddi- will give procardia 10mg  every 30 minutes Pt given 2 doses of Procardia and ctx subsided- some irritability; FHR reassuring for gestational age Clinical Data: 25 year old pregnant female with contractions -  assigned gestational age of [redacted] weeks 3 days. Evaluate cervix.  TRANSVAGINAL OB ULTRASOUND  Technique: Transvaginal ultrasound was performed for evaluation of  the gestation as well as the maternal uterus and adnexal regions.  Comparison: None  Findings: A single living intrauterine gestation is identified in  cephalic presentation. Fetal heart  rate measures 153 beats per  minute and the fetus is in cephalic presentation.  The cervix is closed. Cervical length measures 3.2 cm  transvaginally.  IMPRESSION:  Single living intrauterine gestation with assigned gestational age  of [redacted] weeks 3 days.  Closed cervix with length of 3.2 cm transvaginally.  Original Report Authenticated By: Harmon Pier, M.D.       Results for orders placed during the hospital encounter of 06/15/12 (from the past 24 hour(s))  URINALYSIS, ROUTINE W REFLEX MICROSCOPIC     Status: Abnormal   Collection Time   06/15/12 10:35 AM      Component Value Range   Color, Urine YELLOW  YELLOW   APPearance HAZY (*)  CLEAR   Specific Gravity, Urine 1.020  1.005 - 1.030   pH 7.0  5.0 - 8.0   Glucose, UA NEGATIVE  NEGATIVE mg/dL   Hgb urine dipstick NEGATIVE  NEGATIVE   Bilirubin Urine NEGATIVE  NEGATIVE   Ketones, ur NEGATIVE  NEGATIVE mg/dL   Protein, ur NEGATIVE  NEGATIVE mg/dL   Urobilinogen, UA 0.2  0.0 - 1.0 mg/dL   Nitrite NEGATIVE  NEGATIVE   Leukocytes, UA NEGATIVE  NEGATIVE    Assessment and Plan  Threatened pre term labor Report increase in pain, any spotting/bleeding or leakage of fluid D/c home f/u with Dr, Tamela Oddi Prevention of preterm labor instructions Sherry Sheppard 06/15/2012, 10:54 AM

## 2012-06-15 NOTE — MAU Note (Signed)
Pt reports having abd tightening for the past 2 week. Reports now she having upper abe pain as well that comes and goes with tightening.

## 2012-07-28 ENCOUNTER — Inpatient Hospital Stay (HOSPITAL_COMMUNITY)
Admission: AD | Admit: 2012-07-28 | Discharge: 2012-07-28 | Disposition: A | Discharge status: Home / Self Care | Payer: 59 | Source: Ambulatory Visit | Attending: Obstetrics & Gynecology | Admitting: Obstetrics & Gynecology

## 2012-07-28 ENCOUNTER — Encounter (HOSPITAL_COMMUNITY): Payer: Self-pay | Admitting: Obstetrics and Gynecology

## 2012-07-28 DIAGNOSIS — R1013 Epigastric pain: Secondary | ICD-10-CM | POA: Insufficient documentation

## 2012-07-28 DIAGNOSIS — R12 Heartburn: Secondary | ICD-10-CM

## 2012-07-28 DIAGNOSIS — O4700 False labor before 37 completed weeks of gestation, unspecified trimester: Secondary | ICD-10-CM | POA: Insufficient documentation

## 2012-07-28 DIAGNOSIS — O26899 Other specified pregnancy related conditions, unspecified trimester: Secondary | ICD-10-CM

## 2012-07-28 LAB — WET PREP, GENITAL
Clue Cells Wet Prep HPF POC: NONE SEEN
Trich, Wet Prep: NONE SEEN
Yeast Wet Prep HPF POC: NONE SEEN

## 2012-07-28 LAB — URINALYSIS, ROUTINE W REFLEX MICROSCOPIC
Bilirubin Urine: NEGATIVE
Hgb urine dipstick: NEGATIVE
Nitrite: NEGATIVE
Specific Gravity, Urine: 1.015 (ref 1.005–1.030)
Urobilinogen, UA: 0.2 mg/dL (ref 0.0–1.0)
pH: 6 (ref 5.0–8.0)

## 2012-07-28 MED ORDER — GI COCKTAIL ~~LOC~~
30.0000 mL | Freq: Once | ORAL | Status: AC
  Administered 2012-07-28: 30 mL via ORAL
  Filled 2012-07-28: qty 30

## 2012-07-28 NOTE — MAU Provider Note (Signed)
History     CSN: [id number]  Arrival date & time 07/28/12  1638   None     Chief Complaint  Patient presents with  . Abdominal Pain  . Contraception    (Consider location/radiation/quality/duration/timing/severity/associated sxs/prior treatment) HPI Sherry Sheppard is a 27 y.o. G3P1011 at [redacted]w[redacted]d. She presents with c/o increasing contractions and epigastric pain. She has had irreg contractions x 1 wk, have increased past few days. No change in discharge, bleeding, UTI S&S or GI changes.  She ate at [facility] and had epigastric pain after, no nausea or vomiting. Sh had been taking Prilosic before meals, she took it after eating today, no help. She had this before and came MAU  Got a GI cocktail that worked very well.  Past Medical History  Diagnosis Date  . Allergic rhinitis   . UTI (urinary tract infection) 04/2011  . Recurrent upper respiratory infection (URI)     getting old cold sx - started 09/14/10  . Anemia     history     Past Surgical History  Procedure Date  . Cesarean section 2009  . Tooth extraction 2012  . Dilation and evacuation 09/20/2011    Procedure: DILATATION AND EVACUATION;  Surgeon: Brock Bad, MD;  Location: [facility];  Service: Gynecology;  Laterality: N/A;    Family History  Problem Relation Age of Onset  . Hyperlipidemia Mother   . Lung cancer Maternal Grandmother 26  . Hypertension Other   . Diabetes Cousin     History  Substance Use Topics  . Smoking status: Never Smoker   . Smokeless tobacco: Never Used  . Alcohol Use: No    OB History    Grav Para Term Preterm Abortions TAB SAB Ect Mult Living   3 1 1  1  1   1       Review of Systems  Constitutional: Negative for fever and chills.  Gastrointestinal: Positive for abdominal pain. Negative for nausea, vomiting, diarrhea and constipation.       Epigastric pain  Genitourinary: Positive for pelvic pain. Negative for dysuria, urgency, frequency, vaginal bleeding and vaginal  discharge.    Allergies  Banana  Home Medications  No current outpatient prescriptions on file.  BP 109/58  Pulse 87  Temp 98.3 F (36.8 C) (Oral)  Resp 16  Ht 5' 3.5" (1.613 m)  Wt 162 lb 6 oz (73.653 kg)  BMI 28.31 kg/m2  LMP 11/29/2011  Physical Exam  Constitutional: She is oriented to person, place, and time. She appears well-developed and well-nourished.  Abdominal: Soft. She exhibits mass. There is no tenderness.  Genitourinary:       Pelvic exam: Ext gen- nl anatomy, skin intact Vagina-small amt thick white discharge Cx- long, external os FT,internal os closed Uterus- gravid Adn- non tender  Musculoskeletal: Normal range of motion.  Neurological: She is alert and oriented to person, place, and time.  Skin: Skin is warm and dry.  Psychiatric: She has a normal mood and affect. Her behavior is normal.    ED Course  Procedures (including critical care time)   Labs Reviewed  URINALYSIS, ROUTINE W REFLEX MICROSCOPIC  WET PREP, GENITAL   No results found. Results for orders placed during the hospital encounter of 07/28/12 (from the past 24 hour(s))  URINALYSIS, ROUTINE W REFLEX MICROSCOPIC     Status: Normal   Collection Time   07/28/12  4:56 PM      Component Value Range   Color, Urine YELLOW  YELLOW  APPearance CLEAR  CLEAR   Specific Gravity, Urine 1.015  1.005 - 1.030   pH 6.0  5.0 - 8.0   Glucose, UA NEGATIVE  NEGATIVE mg/dL   Hgb urine dipstick NEGATIVE  NEGATIVE   Bilirubin Urine NEGATIVE  NEGATIVE   Ketones, ur NEGATIVE  NEGATIVE mg/dL   Protein, ur NEGATIVE  NEGATIVE mg/dL   Urobilinogen, UA 0.2  0.0 - 1.0 mg/dL   Nitrite NEGATIVE  NEGATIVE   Leukocytes, UA NEGATIVE  NEGATIVE  WET PREP, GENITAL     Status: Abnormal   Collection Time   07/28/12  5:31 PM      Component Value Range   Yeast Wet Prep HPF POC NONE SEEN  NONE SEEN   Trich, Wet Prep NONE SEEN  NONE SEEN   Clue Cells Wet Prep HPF POC NONE SEEN  NONE SEEN   WBC, Wet Prep HPF POC FEW  (*) NONE SEEN    ASSESSMENT:  Epigastric pain resolving after GI cocktail UA and wet prep neg, cervix unchanged, irritability on monitor   PLAN:  Stay well hydrated, if contractions increase again to call office or return here Kick counts Keep PNV 1/23 with Dr Clearance Coots   MDM   Pt was contracting every 5 min when went back to do discharge talk. Cx was unchanged, emptied bladder, then irritability. Consulted with Dr Tina Griffiths, pt may be discharged, precautions reviewed again.

## 2012-07-28 NOTE — MAU Note (Signed)
Pt states upper abd pain began today, intermittent ctx's for 1 week. Was here before and cervix was closed, was seen for same issue as today. Denies bleeding or vag d/c changes, no uti s/s.

## 2012-07-28 NOTE — MAU Note (Signed)
"  I have been contracting for a month.  I started having upper abd pain that I was seen here for and it caused my belly to start tightening up.  I had more than 6 in one hour.  (+) FM.  No VB or LOF.  I noticed the upper abd pain started after I ate."

## 2012-08-15 ENCOUNTER — Inpatient Hospital Stay (HOSPITAL_COMMUNITY)
Admission: AD | Admit: 2012-08-15 | Discharge: 2012-08-15 | Disposition: A | Discharge status: Home / Self Care | Payer: 59 | Source: Ambulatory Visit | Attending: Obstetrics | Admitting: Obstetrics

## 2012-08-15 ENCOUNTER — Encounter (HOSPITAL_COMMUNITY): Payer: Self-pay | Admitting: *Deleted

## 2012-08-15 DIAGNOSIS — O479 False labor, unspecified: Secondary | ICD-10-CM | POA: Insufficient documentation

## 2012-08-15 MED ORDER — OXYCODONE-ACETAMINOPHEN 5-325 MG PO TABS
2.0000 | ORAL_TABLET | Freq: Once | ORAL | Status: AC
  Administered 2012-08-15: 2 via ORAL
  Filled 2012-08-15: qty 2

## 2012-08-15 MED ORDER — LACTATED RINGERS IV BOLUS (SEPSIS)
2000.0000 mL | Freq: Once | INTRAVENOUS | Status: AC
  Administered 2012-08-15 (×2): 1000 mL via INTRAVENOUS

## 2012-08-15 MED ORDER — NIFEDIPINE 10 MG PO CAPS
20.0000 mg | ORAL_CAPSULE | Freq: Once | ORAL | Status: AC
  Administered 2012-08-15: 20 mg via ORAL
  Filled 2012-08-15: qty 2

## 2012-08-15 NOTE — MAU Note (Signed)
Patient states she has been having contractions between 5-7 minutes. Denies leaking or bleeding and reports good fetal movement.

## 2012-08-20 ENCOUNTER — Encounter (HOSPITAL_COMMUNITY): Payer: Self-pay

## 2012-08-24 ENCOUNTER — Encounter (HOSPITAL_COMMUNITY): Payer: Self-pay

## 2012-08-24 ENCOUNTER — Ambulatory Visit (HOSPITAL_COMMUNITY)
Admit: 2012-08-24 | Discharge: 2012-08-24 | Disposition: A | Discharge status: Home / Self Care | Payer: 59 | Attending: Neurology | Admitting: Neurology

## 2012-08-24 ENCOUNTER — Inpatient Hospital Stay (HOSPITAL_COMMUNITY): Payer: 59

## 2012-08-24 ENCOUNTER — Other Ambulatory Visit: Payer: Self-pay

## 2012-08-24 ENCOUNTER — Encounter (HOSPITAL_COMMUNITY)
Admission: AD | Disposition: A | Discharge status: Home / Self Care | Payer: Self-pay | Source: Ambulatory Visit | Attending: Obstetrics

## 2012-08-24 ENCOUNTER — Encounter (HOSPITAL_COMMUNITY): Payer: Self-pay | Admitting: *Deleted

## 2012-08-24 ENCOUNTER — Inpatient Hospital Stay (HOSPITAL_COMMUNITY)
Admission: AD | Admit: 2012-08-24 | Discharge: 2012-08-27 | DRG: 765 | Disposition: A | Discharge status: Home / Self Care | Payer: 59 | Source: Ambulatory Visit | Attending: Obstetrics | Admitting: Obstetrics

## 2012-08-24 DIAGNOSIS — Z98891 History of uterine scar from previous surgery: Secondary | ICD-10-CM

## 2012-08-24 DIAGNOSIS — R569 Unspecified convulsions: Secondary | ICD-10-CM

## 2012-08-24 DIAGNOSIS — O99354 Diseases of the nervous system complicating childbirth: Secondary | ICD-10-CM | POA: Diagnosis present

## 2012-08-24 DIAGNOSIS — G40309 Generalized idiopathic epilepsy and epileptic syndromes, not intractable, without status epilepticus: Secondary | ICD-10-CM | POA: Diagnosis present

## 2012-08-24 DIAGNOSIS — O34219 Maternal care for unspecified type scar from previous cesarean delivery: Principal | ICD-10-CM | POA: Diagnosis present

## 2012-08-24 DIAGNOSIS — G40909 Epilepsy, unspecified, not intractable, without status epilepticus: Secondary | ICD-10-CM | POA: Diagnosis present

## 2012-08-24 LAB — CBC
HCT: 37.4 % (ref 36.0–46.0)
Platelets: 254 10*3/uL (ref 150–400)
RDW: 13.9 % (ref 11.5–15.5)
WBC: 12.6 10*3/uL — ABNORMAL HIGH (ref 4.0–10.5)

## 2012-08-24 SURGERY — Surgical Case
Anesthesia: Spinal | Site: Abdomen | Wound class: Clean Contaminated

## 2012-08-24 MED ORDER — MORPHINE SULFATE (PF) 0.5 MG/ML IJ SOLN
INTRAMUSCULAR | Status: DC | PRN
  Administered 2012-08-24: .1 mg via INTRATHECAL

## 2012-08-24 MED ORDER — SCOPOLAMINE 1 MG/3DAYS TD PT72
MEDICATED_PATCH | TRANSDERMAL | Status: AC
  Administered 2012-08-24: 1.5 mg via TRANSDERMAL
  Filled 2012-08-24: qty 1

## 2012-08-24 MED ORDER — NALBUPHINE HCL 10 MG/ML IJ SOLN
5.0000 mg | INTRAMUSCULAR | Status: DC | PRN
  Filled 2012-08-24: qty 1

## 2012-08-24 MED ORDER — LACTATED RINGERS IV SOLN
INTRAVENOUS | Status: DC | PRN
  Administered 2012-08-24 (×3): via INTRAVENOUS

## 2012-08-24 MED ORDER — CITRIC ACID-SODIUM CITRATE 334-500 MG/5ML PO SOLN
ORAL | Status: AC
  Filled 2012-08-24: qty 15

## 2012-08-24 MED ORDER — DIBUCAINE 1 % RE OINT: 1.0000 "application " | TOPICAL_OINTMENT | RECTAL | Status: DC | PRN

## 2012-08-24 MED ORDER — NALOXONE HCL 0.4 MG/ML IJ SOLN: 0.4000 mg | INTRAMUSCULAR | Status: DC | PRN

## 2012-08-24 MED ORDER — DIPHENHYDRAMINE HCL 50 MG/ML IJ SOLN: 25.0000 mg | INTRAMUSCULAR | Status: DC | PRN

## 2012-08-24 MED ORDER — DIPHENHYDRAMINE HCL 50 MG/ML IJ SOLN: 12.5000 mg | INTRAMUSCULAR | Status: DC | PRN

## 2012-08-24 MED ORDER — FAMOTIDINE IN NACL 20-0.9 MG/50ML-% IV SOLN
INTRAVENOUS | Status: AC
  Filled 2012-08-24: qty 50

## 2012-08-24 MED ORDER — SIMETHICONE 80 MG PO CHEW: 80.0000 mg | CHEWABLE_TABLET | ORAL | Status: DC | PRN

## 2012-08-24 MED ORDER — GADOBENATE DIMEGLUMINE 529 MG/ML IV SOLN
15.0000 mL | Freq: Once | INTRAVENOUS | Status: AC | PRN
  Administered 2012-08-24: 15 mL via INTRAVENOUS

## 2012-08-24 MED ORDER — FENTANYL CITRATE 0.05 MG/ML IJ SOLN
INTRAMUSCULAR | Status: AC
  Administered 2012-08-24: 50 ug via INTRAVENOUS
  Filled 2012-08-24: qty 2

## 2012-08-24 MED ORDER — KETOROLAC TROMETHAMINE 30 MG/ML IJ SOLN: 30.0000 mg | Freq: Four times a day (QID) | INTRAMUSCULAR | Status: AC | PRN

## 2012-08-24 MED ORDER — DIPHENHYDRAMINE HCL 25 MG PO CAPS: 25.0000 mg | ORAL_CAPSULE | Freq: Four times a day (QID) | ORAL | Status: DC | PRN

## 2012-08-24 MED ORDER — BUPIVACAINE IN DEXTROSE 0.75-8.25 % IT SOLN
INTRATHECAL | Status: DC | PRN
  Administered 2012-08-24: 1.6 mL via INTRATHECAL

## 2012-08-24 MED ORDER — CITRIC ACID-SODIUM CITRATE 334-500 MG/5ML PO SOLN
30.0000 mL | Freq: Once | ORAL | Status: AC
  Administered 2012-08-24: 30 mL via ORAL

## 2012-08-24 MED ORDER — PHENYLEPHRINE HCL 10 MG/ML IJ SOLN
INTRAMUSCULAR | Status: DC | PRN
  Administered 2012-08-24: 40 ug via INTRAVENOUS
  Administered 2012-08-24: 80 ug via INTRAVENOUS
  Administered 2012-08-24 (×2): 40 ug via INTRAVENOUS

## 2012-08-24 MED ORDER — FENTANYL CITRATE 0.05 MG/ML IJ SOLN
25.0000 ug | INTRAMUSCULAR | Status: DC | PRN
  Administered 2012-08-24 (×2): 50 ug via INTRAVENOUS

## 2012-08-24 MED ORDER — IBUPROFEN 600 MG PO TABS
600.0000 mg | ORAL_TABLET | Freq: Four times a day (QID) | ORAL | Status: DC
  Administered 2012-08-25 – 2012-08-27 (×10): 600 mg via ORAL
  Filled 2012-08-24 (×10): qty 1

## 2012-08-24 MED ORDER — OXYTOCIN 40 UNITS IN LACTATED RINGERS INFUSION - SIMPLE MED: 62.5000 mL/h | INTRAVENOUS | Status: AC

## 2012-08-24 MED ORDER — METOCLOPRAMIDE HCL 5 MG/ML IJ SOLN: 10.0000 mg | Freq: Three times a day (TID) | INTRAMUSCULAR | Status: DC | PRN

## 2012-08-24 MED ORDER — CEFAZOLIN SODIUM-DEXTROSE 2-3 GM-% IV SOLR
INTRAVENOUS | Status: AC
  Filled 2012-08-24: qty 50

## 2012-08-24 MED ORDER — SODIUM CHLORIDE 0.9 % IJ SOLN: 3.0000 mL | INTRAMUSCULAR | Status: DC | PRN

## 2012-08-24 MED ORDER — OXYTOCIN 10 UNIT/ML IJ SOLN
INTRAMUSCULAR | Status: AC
  Filled 2012-08-24: qty 4

## 2012-08-24 MED ORDER — MIDAZOLAM HCL 2 MG/2ML IJ SOLN: 0.5000 mg | Freq: Once | INTRAMUSCULAR | Status: DC | PRN

## 2012-08-24 MED ORDER — ACETAMINOPHEN 10 MG/ML IV SOLN
1000.0000 mg | Freq: Four times a day (QID) | INTRAVENOUS | Status: AC | PRN
  Filled 2012-08-24: qty 100

## 2012-08-24 MED ORDER — LANOLIN HYDROUS EX OINT: 1.0000 "application " | TOPICAL_OINTMENT | CUTANEOUS | Status: DC | PRN

## 2012-08-24 MED ORDER — ONDANSETRON HCL 4 MG/2ML IJ SOLN
INTRAMUSCULAR | Status: AC
  Filled 2012-08-24: qty 2

## 2012-08-24 MED ORDER — DIPHENHYDRAMINE HCL 25 MG PO CAPS: 25.0000 mg | ORAL_CAPSULE | ORAL | Status: DC | PRN

## 2012-08-24 MED ORDER — ALPRAZOLAM 0.5 MG PO TABS
0.5000 mg | ORAL_TABLET | Freq: Once | ORAL | Status: AC
  Administered 2012-08-24: 0.5 mg via ORAL
  Filled 2012-08-24: qty 1

## 2012-08-24 MED ORDER — ATROPINE SULFATE 0.1 MG/ML IJ SOLN
INTRAMUSCULAR | Status: AC
  Filled 2012-08-24: qty 10

## 2012-08-24 MED ORDER — NALOXONE HCL 1 MG/ML IJ SOLN
1.0000 ug/kg/h | INTRAMUSCULAR | Status: DC | PRN
  Filled 2012-08-24: qty 2

## 2012-08-24 MED ORDER — SIMETHICONE 80 MG PO CHEW
80.0000 mg | CHEWABLE_TABLET | Freq: Three times a day (TID) | ORAL | Status: DC
  Administered 2012-08-24 – 2012-08-27 (×11): 80 mg via ORAL

## 2012-08-24 MED ORDER — OXYTOCIN 10 UNIT/ML IJ SOLN
40.0000 [IU] | INTRAVENOUS | Status: DC | PRN
  Administered 2012-08-24: 40 [IU] via INTRAVENOUS

## 2012-08-24 MED ORDER — LACTATED RINGERS IV SOLN
INTRAVENOUS | Status: DC
  Administered 2012-08-24 (×2): via INTRAVENOUS

## 2012-08-24 MED ORDER — WITCH HAZEL-GLYCERIN EX PADS: 1.0000 "application " | MEDICATED_PAD | CUTANEOUS | Status: DC | PRN

## 2012-08-24 MED ORDER — PROMETHAZINE HCL 25 MG/ML IJ SOLN: 6.2500 mg | INTRAMUSCULAR | Status: DC | PRN

## 2012-08-24 MED ORDER — FAMOTIDINE IN NACL 20-0.9 MG/50ML-% IV SOLN
20.0000 mg | Freq: Once | INTRAVENOUS | Status: AC
  Administered 2012-08-24: 20 mg via INTRAVENOUS

## 2012-08-24 MED ORDER — OXYCODONE-ACETAMINOPHEN 5-325 MG PO TABS
1.0000 | ORAL_TABLET | ORAL | Status: DC | PRN
  Administered 2012-08-24 – 2012-08-25 (×4): 1 via ORAL
  Administered 2012-08-25: 2 via ORAL
  Administered 2012-08-25 – 2012-08-26 (×4): 1 via ORAL
  Administered 2012-08-26: 2 via ORAL
  Administered 2012-08-27 (×2): 1 via ORAL
  Administered 2012-08-27: 2 via ORAL
  Filled 2012-08-24: qty 2
  Filled 2012-08-24 (×4): qty 1
  Filled 2012-08-24: qty 2
  Filled 2012-08-24 (×6): qty 1
  Filled 2012-08-24: qty 2

## 2012-08-24 MED ORDER — MIDAZOLAM HCL 5 MG/5ML IJ SOLN
INTRAMUSCULAR | Status: DC | PRN
  Administered 2012-08-24: 2 mg via INTRAVENOUS

## 2012-08-24 MED ORDER — LACTATED RINGERS IV SOLN
INTRAVENOUS | Status: DC
  Administered 2012-08-25: via INTRAVENOUS

## 2012-08-24 MED ORDER — PRENATAL MULTIVITAMIN CH
1.0000 | ORAL_TABLET | Freq: Every day | ORAL | Status: DC
  Administered 2012-08-25 – 2012-08-27 (×3): 1 via ORAL
  Filled 2012-08-24 (×3): qty 1

## 2012-08-24 MED ORDER — ONDANSETRON HCL 4 MG/2ML IJ SOLN
INTRAMUSCULAR | Status: DC | PRN
  Administered 2012-08-24: 4 mg via INTRAVENOUS

## 2012-08-24 MED ORDER — ATROPINE SULFATE 0.4 MG/ML IJ SOLN
INTRAMUSCULAR | Status: DC | PRN
  Administered 2012-08-24: 0.2 mg via INTRAVENOUS

## 2012-08-24 MED ORDER — ONDANSETRON HCL 4 MG/2ML IJ SOLN
4.0000 mg | Freq: Three times a day (TID) | INTRAMUSCULAR | Status: DC | PRN
  Filled 2012-08-24: qty 2

## 2012-08-24 MED ORDER — KETOROLAC TROMETHAMINE 30 MG/ML IJ SOLN
INTRAMUSCULAR | Status: AC
  Administered 2012-08-24: 30 mg via INTRAVENOUS
  Filled 2012-08-24: qty 1

## 2012-08-24 MED ORDER — MIDAZOLAM HCL 2 MG/2ML IJ SOLN
INTRAMUSCULAR | Status: AC
  Filled 2012-08-24: qty 2

## 2012-08-24 MED ORDER — CEFAZOLIN SODIUM-DEXTROSE 2-3 GM-% IV SOLR: 2.0000 g | INTRAVENOUS | Status: DC

## 2012-08-24 MED ORDER — SENNOSIDES-DOCUSATE SODIUM 8.6-50 MG PO TABS
2.0000 | ORAL_TABLET | Freq: Every day | ORAL | Status: DC
  Administered 2012-08-24 – 2012-08-26 (×3): 2 via ORAL

## 2012-08-24 MED ORDER — TETANUS-DIPHTH-ACELL PERTUSSIS 5-2.5-18.5 LF-MCG/0.5 IM SUSP
0.5000 mL | Freq: Once | INTRAMUSCULAR | Status: DC
  Filled 2012-08-24: qty 0.5

## 2012-08-24 MED ORDER — FENTANYL CITRATE 0.05 MG/ML IJ SOLN
INTRAMUSCULAR | Status: DC | PRN
  Administered 2012-08-24: 25 ug via INTRATHECAL
  Administered 2012-08-24: 75 ug via INTRAVENOUS

## 2012-08-24 MED ORDER — KETOROLAC TROMETHAMINE 30 MG/ML IJ SOLN
30.0000 mg | Freq: Four times a day (QID) | INTRAMUSCULAR | Status: AC | PRN
  Administered 2012-08-24: 30 mg via INTRAVENOUS
  Filled 2012-08-24: qty 1

## 2012-08-24 MED ORDER — EPHEDRINE 5 MG/ML INJ
INTRAVENOUS | Status: AC
  Filled 2012-08-24: qty 10

## 2012-08-24 MED ORDER — CEFAZOLIN SODIUM-DEXTROSE 2-3 GM-% IV SOLR
2.0000 g | Freq: Once | INTRAVENOUS | Status: AC
  Administered 2012-08-24: 2 g via INTRAVENOUS

## 2012-08-24 MED ORDER — SCOPOLAMINE 1 MG/3DAYS TD PT72
1.0000 | MEDICATED_PATCH | Freq: Once | TRANSDERMAL | Status: DC
  Administered 2012-08-24: 1.5 mg via TRANSDERMAL

## 2012-08-24 MED ORDER — FENTANYL CITRATE 0.05 MG/ML IJ SOLN
INTRAMUSCULAR | Status: AC
  Filled 2012-08-24: qty 2

## 2012-08-24 MED ORDER — MEPERIDINE HCL 25 MG/ML IJ SOLN: 6.2500 mg | INTRAMUSCULAR | Status: DC | PRN

## 2012-08-24 MED ORDER — ONDANSETRON HCL 4 MG PO TABS: 4.0000 mg | ORAL_TABLET | ORAL | Status: DC | PRN

## 2012-08-24 MED ORDER — MENTHOL 3 MG MT LOZG: 1.0000 | LOZENGE | OROMUCOSAL | Status: DC | PRN

## 2012-08-24 MED ORDER — EPHEDRINE SULFATE 50 MG/ML IJ SOLN
INTRAMUSCULAR | Status: DC | PRN
  Administered 2012-08-24 (×5): 10 mg via INTRAVENOUS

## 2012-08-24 MED ORDER — ONDANSETRON HCL 4 MG/2ML IJ SOLN
4.0000 mg | INTRAMUSCULAR | Status: DC | PRN
  Administered 2012-08-24: 4 mg via INTRAVENOUS

## 2012-08-24 MED ORDER — MORPHINE SULFATE 0.5 MG/ML IJ SOLN
INTRAMUSCULAR | Status: AC
  Filled 2012-08-24: qty 10

## 2012-08-24 MED ORDER — ZOLPIDEM TARTRATE 5 MG PO TABS: 5.0000 mg | ORAL_TABLET | Freq: Every evening | ORAL | Status: DC | PRN

## 2012-08-24 SURGICAL SUPPLY — 36 items
ADH SKN CLS APL DERMABOND .7 (GAUZE/BANDAGES/DRESSINGS) ×1
ADH SKN CLS LQ APL DERMABOND (GAUZE/BANDAGES/DRESSINGS) ×1
CLOTH BEACON ORANGE TIMEOUT ST (SAFETY) ×2 IMPLANT
DERMABOND ADHESIVE PROPEN (GAUZE/BANDAGES/DRESSINGS) ×1
DERMABOND ADVANCED (GAUZE/BANDAGES/DRESSINGS) ×1
DERMABOND ADVANCED .7 DNX12 (GAUZE/BANDAGES/DRESSINGS) ×1 IMPLANT
DERMABOND ADVANCED .7 DNX6 (GAUZE/BANDAGES/DRESSINGS) ×1 IMPLANT
DRAPE LG THREE QUARTER DISP (DRAPES) ×2 IMPLANT
DRSG OPSITE POSTOP 4X10 (GAUZE/BANDAGES/DRESSINGS) ×2 IMPLANT
DRSG OPSITE POSTOP 4X12 (GAUZE/BANDAGES/DRESSINGS) ×2 IMPLANT
DURAPREP 26ML APPLICATOR (WOUND CARE) ×2 IMPLANT
ELECT REM PT RETURN 9FT ADLT (ELECTROSURGICAL) ×2
ELECTRODE REM PT RTRN 9FT ADLT (ELECTROSURGICAL) ×1 IMPLANT
EXTRACTOR VACUUM M CUP 4 TUBE (SUCTIONS) ×2 IMPLANT
GLOVE BIO SURGEON STRL SZ8.5 (GLOVE) ×2 IMPLANT
GLOVE SKINSENSE N 8.5 STRL (GLOVE) ×2 IMPLANT
GOWN PREVENTION PLUS LG XLONG (DISPOSABLE) ×4 IMPLANT
GOWN PREVENTION PLUS XXLARGE (GOWN DISPOSABLE) ×2 IMPLANT
KIT ABG SYR 3ML LUER SLIP (SYRINGE) IMPLANT
NEEDLE HYPO 25X5/8 SAFETYGLIDE (NEEDLE) ×2 IMPLANT
NS IRRIG 1000ML POUR BTL (IV SOLUTION) ×2 IMPLANT
PACK C SECTION WH (CUSTOM PROCEDURE TRAY) ×2 IMPLANT
PAD OB MATERNITY 4.3X12.25 (PERSONAL CARE ITEMS) ×2 IMPLANT
SLEEVE SCD COMPRESS KNEE MED (MISCELLANEOUS) ×2 IMPLANT
SUT CHROMIC 0 CT 802H (SUTURE) ×2 IMPLANT
SUT CHROMIC 1 CTX 36 (SUTURE) ×4 IMPLANT
SUT CHROMIC 2 0 SH (SUTURE) ×2 IMPLANT
SUT GUT PLAIN 0 CT-3 TAN 27 (SUTURE) IMPLANT
SUT MON AB 4-0 PS1 27 (SUTURE) ×2 IMPLANT
SUT VIC AB 0 CT1 18XCR BRD8 (SUTURE) IMPLANT
SUT VIC AB 0 CT1 8-18 (SUTURE)
SUT VIC AB 0 CTX 36 (SUTURE) ×4
SUT VIC AB 0 CTX36XBRD ANBCTRL (SUTURE) ×2 IMPLANT
TOWEL OR 17X24 6PK STRL BLUE (TOWEL DISPOSABLE) ×6 IMPLANT
TRAY FOLEY CATH 14FR (SET/KITS/TRAYS/PACK) ×2 IMPLANT
WATER STERILE IRR 1000ML POUR (IV SOLUTION) ×2 IMPLANT

## 2012-08-24 NOTE — Op Note (Signed)
preop diagnosis previous cesarean section at 38 weeks and 3 days in labor for repeat C-section grand mal seizure  During  C-section    postop diagnosis is same Anesthesia spinal Surgeon Dr. Francoise Ceo Procedure patient placed on the operating table in the supine position abdomen prepped and draped bladder emptied with a Foley catheter the patient was very apprehensive and was crying a transverse suprapubic incision made carried him to the rectus fascia fascia cleaned and incised the length of the incision at that time the patient developed a grand mal seizure on admission prior to the spinal her blood pressure was 07/31/2006 3 and at that time of the seizure blood pressure was 136/75 she got 2 mg of Versed and 75 mics of fentanyl at that time stopped patient was post ictal throat surgery the recti muscles were retracted laterally and the peritoneum incised longitudinally transverse incision made on the visceroperitoneum above the bladder and the bladder mobilized inferiorly transverse low uterine incision made fluid clear patient delivered of a female Apgar 9 and 9 placenta was posterior removed manually and sent to labor and delivery uterine cavity clean and dry laps uterine incision closed in one layer with continuous suture of #1 chromic hemostasis was satisfactory bladder flap reattached to a chromic lap and sponge counts correct abdomen closed in layers peritoneum continuous with of 0 chromic fascia continuous within of 0 Dexon and the skin closed with subcuticular stitch of 4-0 Monocryl blood glass was 600 cc

## 2012-08-24 NOTE — Anesthesia Procedure Notes (Signed)
Spinal  Patient location during procedure: OR Start time: 08/24/2012 9:19 AM Staffing Anesthesiologist: Angus Seller., Harrell Gave. Performed by: anesthesiologist  Preanesthetic Checklist Completed: patient identified, site marked, surgical consent, pre-op evaluation, timeout performed, IV checked, risks and benefits discussed and monitors and equipment checked Spinal Block Patient position: sitting Prep: DuraPrep Patient monitoring: heart rate, cardiac monitor, continuous pulse ox and blood pressure Approach: midline Location: L3-4 Injection technique: single-shot Needle Needle type: Sprotte  Needle gauge: 24 G Needle length: 9 cm Assessment Sensory level: T4 Additional Notes Patient identified.  Risk benefits discussed including failed block, incomplete pain control, headache, nerve damage, paralysis, blood pressure changes, nausea, vomiting, reactions to medication both toxic or allergic, and postpartum back pain.  Patient expressed understanding and wished to proceed.  All questions were answered.  Sterile technique used throughout procedure.  CSF was clear.  No parasthesia or other complications.  Please see nursing notes for vital signs.

## 2012-08-24 NOTE — Progress Notes (Signed)
Patient's mother called into PACU wanting update on patient.  Florentina Addison, PBT who answered the phone said her nurse was not available (I was checking patient's fundus) and Gaylyn Lambert, RN instructed FOB Reggie Schlicker to go and update patient's mother.  Reggie update patient's mother.  Patient is doing very well in PACU.

## 2012-08-24 NOTE — Progress Notes (Signed)
Dr Gaynell Face called.  Informed him that MAU RN did not draw a T & S today and did he want me to have it drawn in PACU.  T & S in computer from 2013 - A positive.  Patient is a medium PPH risk but Dr Gaynell Face said it was not necessary to have the blood draw because patient is not going to need blood.  Verified verbal order - No T & S needed to be drawn in PACU.

## 2012-08-24 NOTE — MAU Note (Signed)
PT SAYS HER MUCUS PLUG CAME  OUT ON Tuesday.   THEN THIS AM - HAD ON PAD- WET.    PT IS PLANNED C/S  FOR 2-21.   NO VE IN OFFICE.  DENIES HSV  AND MRSA.

## 2012-08-24 NOTE — Anesthesia Preprocedure Evaluation (Signed)
Anesthesia Evaluation  Patient identified by MRN, date of birth, ID band Patient awake    Reviewed: Allergy & Precautions, H&P , NPO status , Patient's Chart, lab work & pertinent test results  Airway Mallampati: II      Dental no notable dental hx.    Pulmonary neg pulmonary ROS, Recent URI ,  breath sounds clear to auscultation  Pulmonary exam normal       Cardiovascular Exercise Tolerance: Good negative cardio ROS  Rhythm:regular Rate:Normal     Neuro/Psych negative neurological ROS  negative psych ROS   GI/Hepatic negative GI ROS, Neg liver ROS,   Endo/Other  negative endocrine ROS  Renal/GU negative Renal ROS  negative genitourinary   Musculoskeletal   Abdominal Normal abdominal exam  (+)   Peds  Hematology negative hematology ROS (+) anemia ,   Anesthesia Other Findings Allergic rhinitis     UTI (urinary tract infection) 04/2011      Recurrent upper respiratory infection (URI)   getting old cold sx - started 09/14/10 Anemia   history     Reproductive/Obstetrics (+) Pregnancy                           Anesthesia Physical Anesthesia Plan  ASA: II and emergent  Anesthesia Plan: Spinal   Post-op Pain Management:    Induction:   Airway Management Planned:   Additional Equipment:   Intra-op Plan:   Post-operative Plan:   Informed Consent: I have reviewed the patients History and Physical, chart, labs and discussed the procedure including the risks, benefits and alternatives for the proposed anesthesia with the patient or authorized representative who has indicated his/her understanding and acceptance.     Plan Discussed with: Anesthesiologist, CRNA and Surgeon  Anesthesia Plan Comments:         Anesthesia Quick Evaluation

## 2012-08-24 NOTE — MAU Note (Signed)
Contractions since 0430. Leaked some fld last night so put on pad and pad this am was wet but no leaking since.. Lost mucous plug

## 2012-08-24 NOTE — Transfer of Care (Signed)
Immediate Anesthesia Transfer of Care Note  Patient: Sherry Sheppard  Procedure(s) Performed: Procedure(s): CESAREAN SECTION (N/A)  Patient Location: PACU  Anesthesia Type:Spinal  Level of Consciousness: awake, alert  and oriented  Airway & Oxygen Therapy: Patient Spontanous Breathing and Patient connected to nasal cannula oxygen  Post-op Assessment: Report given to PACU RN and Post -op Vital signs reviewed and stable  Post vital signs: Reviewed and stable  Complications: No apparent anesthesia complications

## 2012-08-24 NOTE — Consult Note (Addendum)
Reason for Consult:New onset seizure Referring Physician: Gaynell Face  CC: Seizure  HPI: Sherry Sheppard is an 27 y.o. female who underwent a C-section earlier today.  During surgery patient developed what was described as a grand mal seizure.  She was given Versed and Fentanyl and no further seizures were noted.  Patient was hypertensive at that time but currently has a normal BP.  The patient reports that she remembers having a panic attack.  She became anxious, felt as if something was on her chest and had SOB.  She then noted that her right arm was shaking and she could not control it.  She then reports straightening out.  That is the last thing she recalls.   Patient reports no history of seizures.  Also reports no history of significant head injury or CNS infection.    Past Medical History  Diagnosis Date  . Allergic rhinitis   . UTI (urinary tract infection) 04/2011  . Recurrent upper respiratory infection (URI)     getting old cold sx - started 09/14/10  . Anemia     history     Past Surgical History  Procedure Laterality Date  . Cesarean section  2009  . Tooth extraction  2012  . Dilation and evacuation  09/20/2011    Procedure: DILATATION AND EVACUATION;  Surgeon: Brock Bad, MD;  Location: [facility];  Service: Gynecology;  Laterality: N/A;    Family History  Problem Relation Age of Onset  . Hyperlipidemia Mother   . Lung cancer Maternal Grandmother 60  . Hypertension Other   . Diabetes Cousin     Social History:  reports that she has never smoked. She has never used smokeless tobacco. She reports that she does not drink alcohol or use illicit drugs.  Allergies  Allergen Reactions  . Banana Shortness Of Breath    Medications:  I have reviewed the patient's current medications. Prior to Admission:  Prescriptions prior to admission  Medication Sig Dispense Refill  . [DISCONTINUED] cholecalciferol (VITAMIN D) 1000 UNITS tablet Take 1,000 Units by mouth daily.       . [DISCONTINUED] Iron-FA-B Cmp-C-Biot-Probiotic (FUSION PLUS) CAPS Take 1 capsule by mouth every morning.      . [DISCONTINUED] omeprazole (PRILOSEC) 20 MG capsule Take 20 mg by mouth 2 (two) times daily.      . [DISCONTINUED] Ondansetron (ZOFRAN ODT PO) Take 8 mg by mouth every 6 (six) hours as needed. Nausea      . [DISCONTINUED] Prenatal Vit-Fe Fumarate-FA (PRENATAL MULTIVITAMIN) TABS Take 1 tablet by mouth daily.       Scheduled: . ibuprofen  600 mg Oral Q6H  . prenatal multivitamin  1 tablet Oral Daily  . senna-docusate  2 tablet Oral QHS  . simethicone  80 mg Oral TID PC & HS  . [START ON 08/25/2012] TDaP  0.5 mL Intramuscular Once    ROS: History obtained from the patient  General ROS: negative for - chills, fatigue, fever, night sweats, weight gain or weight loss Psychological ROS: as noted in HPI Ophthalmic ROS: negative for - blurry vision, double vision, eye pain or loss of vision ENT ROS: negative for - epistaxis, nasal discharge, oral lesions, sore throat, tinnitus or vertigo Allergy and Immunology ROS: negative for - hives or itchy/watery eyes Hematological and Lymphatic ROS: negative for - bleeding problems, bruising or swollen lymph nodes Endocrine ROS: negative for - galactorrhea, hair pattern changes, polydipsia/polyuria or temperature intolerance Respiratory ROS: negative for - cough, hemoptysis, shortness of  breath or wheezing Cardiovascular ROS: negative for - chest pain, dyspnea on exertion, edema or irregular heartbeat Gastrointestinal ROS: negative for - abdominal pain, diarrhea, hematemesis, nausea/vomiting or stool incontinence Genito-Urinary ROS: negative for - dysuria, hematuria, incontinence or urinary frequency/urgency Musculoskeletal ROS: negative for - joint swelling or muscular weakness Neurological ROS: as noted in HPI Dermatological ROS: negative for rash and skin lesion changes  Physical Examination: Blood pressure 109/57, pulse 80, temperature  97.9 F (36.6 C), temperature source Oral, resp. rate 20, height 5\' 5"  (1.651 m), weight 75.388 kg (166 lb 3.2 oz), last menstrual period 11/29/2011, SpO2 100.00%, unknown if currently breastfeeding.  Neurologic Examination Mental Status: Alert, oriented, thought content appropriate.  Speech fluent without evidence of aphasia.  Able to follow 3 step commands without difficulty. Cranial Nerves: II: Discs flat bilaterally; Visual fields grossly normal, pupils equal, round, reactive to light and accommodation III,IV, VI: ptosis not present, extra-ocular motions intact bilaterally V,VII: smile symmetric, facial light touch sensation normal bilaterally VIII: hearing normal bilaterally IX,X: gag reflex present XI: bilateral shoulder shrug XII: midline tongue extension Motor: Right : Upper extremity   5/5    Left:     Upper extremity   5/5  Lower extremity   5/5     Lower extremity   5/5 Tone and bulk:normal tone throughout; no atrophy noted Sensory: Pinprick and light touch intact throughout, bilaterally Deep Tendon Reflexes: 2+ and symmetric throughout Plantars: Right: mute   Left: mute Cerebellar: normal finger-to-nose, normal rapid alternating movements  Gait: patient post-op and gait not tested CV: pulses palpable throughout   Laboratory Studies:   Basic Metabolic Panel: No results found for this basename: NA, K, CL, CO2, GLUCOSE, BUN, CREATININE, CALCIUM, MG, PHOS,  in the last 168 hours  Liver Function Tests: No results found for this basename: AST, ALT, ALKPHOS, BILITOT, PROT, ALBUMIN,  in the last 168 hours No results found for this basename: LIPASE, AMYLASE,  in the last 168 hours No results found for this basename: AMMONIA,  in the last 168 hours  CBC:  Recent Labs Lab 08/24/12 0807  WBC 12.6*  HGB 12.2  HCT 37.4  MCV 83.7  PLT 254    Cardiac Enzymes: No results found for this basename: CKTOTAL, CKMB, CKMBINDEX, TROPONINI,  in the last 168 hours  BNP: No  components found with this basename: POCBNP,   CBG: No results found for this basename: GLUCAP,  in the last 168 hours  Microbiology: Results for orders placed during the hospital encounter of 07/28/12  WET PREP, GENITAL     Status: Abnormal   Collection Time    07/28/12  5:31 PM      Result Value Range Status   Yeast Wet Prep HPF POC NONE SEEN  NONE SEEN Final   Trich, Wet Prep NONE SEEN  NONE SEEN Final   Clue Cells Wet Prep HPF POC NONE SEEN  NONE SEEN Final   WBC, Wet Prep HPF POC FEW (*) NONE SEEN Final   Comment: MANY BACTERIA SEEN    Coagulation Studies: No results found for this basename: LABPROT, INR,  in the last 72 hours  Urinalysis: No results found for this basename: COLORURINE, APPERANCEUR, LABSPEC, PHURINE, GLUCOSEU, HGBUR, BILIRUBINUR, KETONESUR, PROTEINUR, UROBILINOGEN, NITRITE, LEUKOCYTESUR,  in the last 168 hours  Lipid Panel:     Component Value Date/Time   CHOL 170 06/29/2011 1033   TRIG 54.0 06/29/2011 1033   HDL 78.30 06/29/2011 1033   CHOLHDL 2 06/29/2011 1033  VLDL 10.8 06/29/2011 1033   LDLCALC 81 06/29/2011 1033    HgbA1C:  No results found for this basename: HGBA1C    Urine Drug Screen:   No results found for this basename: labopia, cocainscrnur, labbenz, amphetmu, thcu, labbarb    Alcohol Level: No results found for this basename: ETH,  in the last 168 hours  Imaging: No results found.   Assessment/Plan: 27 year old female, previously healthy, who experienced a seizure today with surgery.  No previous history of seizure.  Currently back to baseline.  Per Dr. Gaynell Face no evidence of eclampsia.  Concerned about the patient's recount of events that suggests a focal onset.  Would like to rule out a possible intracerebral process.    Recommendations: 1.  MRI of the brain without contrast.   2.  If MRI unremarkable, no further inpatient work up recommended.  Patient may have an EEG on an outpatient basis.  If unremarkable and no further  seizures noted also would not initiate anticonvulsant therapy.   3.  Seizure precautions while hospitalized 4.  Patient unable to drive, operate heavy machinery, perform activities at heights and participate in water activities until release by outpatient physician.   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  There is no evidence of current hemorrhage or past hemorrhage.  Seizure likely provoked.  Will not start anticonvulsants at this time.  Patient to follow up with neurology as an outpatient.  Will schedule appointment once office open on Monday.    Continue seizure precautions  Discussed with Dr. Cherylann Ratel, MD [facility] [phone]

## 2012-08-24 NOTE — Anesthesia Postprocedure Evaluation (Signed)
  Anesthesia Post-op Note  Patient: Sherry Sheppard  Procedure(s) Performed: Procedure(s): CESAREAN SECTION (N/A)  Patient Location: PACU and ICU  Anesthesia Type:Spinal  Level of Consciousness: awake, alert , oriented and patient cooperative  Airway and Oxygen Therapy: Patient Spontanous Breathing  Post-op Pain: none  Post-op Assessment: Post-op Vital signs reviewed and No signs of Nausea or vomiting  Post-op Vital Signs: Reviewed and stable  Complications: No apparent anesthesia complications

## 2012-08-24 NOTE — Addendum Note (Signed)
Addendum created 08/24/12 1146 by Lincoln Brigham, CRNA   Modules edited: Anesthesia Flowsheet, Anesthesia Responsible Staff

## 2012-08-24 NOTE — Progress Notes (Signed)
I updated Patient's mother.  All questions and concerns was addressed.

## 2012-08-24 NOTE — H&P (Signed)
This is Dr. Francoise Ceo dictating the history and physical on blank blank she's a 27 year old gravida 3 para 1011 at 23 weeks and 3 days [place] 09/04/2012 previous cesarean section no in an in labor and desires repeat C-section cervix 1 cm long Past medical history negative Past surgical history C-section x1 Social history negative Physical exam revealed a well-developed female in in in labor HEENT negative Lungs clear to P&A Heart regular rhythm no murmurs no gallops Abdomen term Pelvic as described above Extremities negative and

## 2012-08-24 NOTE — Anesthesia Postprocedure Evaluation (Signed)
Anesthesia Post Note  Patient: Sherry Sheppard  Procedure(s) Performed: Procedure(s) (LRB): CESAREAN SECTION (N/A)  Anesthesia type: Spinal  Patient location: PACU  Post pain: Pain level controlled  Post assessment: Post-op Vital signs reviewed  Last Vitals:  Filed Vitals:   08/24/12 1100  BP: 113/70  Pulse: 73  Temp: 36.4 C  Resp: 18    Post vital signs: Reviewed  Level of consciousness: awake, but lethargic.  Responds appropriately  Complications: No apparent anesthesia complications.  Unlikely that seizure was related to anesthetic. Neurology to be called by Ob and discharge to AICU for further observation.

## 2012-08-25 LAB — CBC
Hemoglobin: 9.4 g/dL — ABNORMAL LOW (ref 12.0–15.0)
MCH: 27 pg (ref 26.0–34.0)
Platelets: 192 10*3/uL (ref 150–400)
RBC: 3.48 MIL/uL — ABNORMAL LOW (ref 3.87–5.11)

## 2012-08-25 NOTE — Progress Notes (Signed)
Pt breastfeeding at present-monitors removed

## 2012-08-25 NOTE — Progress Notes (Signed)
Patient ID: Sherry Sheppard, female   DOB: [birth year], 27 y.o.   MRN: [id number] Vital signs normal Patient was seen by Dr. 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

## 2012-08-26 ENCOUNTER — Encounter (HOSPITAL_COMMUNITY): Payer: Self-pay | Admitting: Obstetrics

## 2012-08-26 MED ORDER — SODIUM CHLORIDE 0.9 % IV SOLN: 250.0000 mL | INTRAVENOUS | Status: DC | PRN

## 2012-08-26 MED ORDER — MAGNESIUM HYDROXIDE 400 MG/5ML PO SUSP
30.0000 mL | Freq: Every day | ORAL | Status: DC | PRN
  Administered 2012-08-26: 30 mL via ORAL
  Filled 2012-08-26: qty 30

## 2012-08-26 MED ORDER — SODIUM CHLORIDE 0.9 % IJ SOLN: 3.0000 mL | Freq: Two times a day (BID) | INTRAMUSCULAR | Status: DC

## 2012-08-26 MED ORDER — SODIUM CHLORIDE 0.9 % IJ SOLN: 3.0000 mL | INTRAMUSCULAR | Status: DC | PRN

## 2012-08-26 NOTE — Progress Notes (Signed)
Subjective: Postpartum Day 2: Cesarean Delivery Patient reports tolerating PO, + flatus, + BM and no problems voiding.    Objective: Vital signs in last 24 hours: Temp:  [98.2 F (36.8 C)-98.8 F (37.1 C)] 98.7 F (37.1 C) (02/17 0532) Pulse Rate:  [88-120] 99 (02/17 0532) Resp:  [16-18] 18 (02/17 0815) BP: (87-116)/(52-87) 116/87 mmHg (02/17 0532) SpO2:  [98 %-99 %] 98 % (02/17 0532)  Physical Exam:  General: No complaints. Lochia: appropriate Uterine Fundus: firm Incision: healing well DVT Evaluation: No evidence of DVT seen on physical exam.   Recent Labs  08/24/12 0807 08/25/12 0525  HGB 12.2 9.4*  HCT 37.4 28.9*    Assessment/Plan: Status post Cesarean section. Doing well postoperatively.  Continue current care.  Ximenna Fonseca A 08/26/2012, 9:20 AM

## 2012-08-26 NOTE — Progress Notes (Signed)
Pt transferred to [facility] rm #[id number] ambulatory  - report updated to Wyona Almas, RN

## 2012-08-26 NOTE — Progress Notes (Signed)
Pt given information from Felicie Morn, neurology PA about date, time, & MD name for f/u as OP.

## 2012-08-26 NOTE — Progress Notes (Signed)
UR chart review completed.  

## 2012-08-26 NOTE — Progress Notes (Signed)
Subjective: Postpartum Day 2: Cesarean Delivery Patient reports tolerating PO, + flatus, + BM and no problems voiding.    Objective: Vital signs in last 24 hours: Temp:  [98.2 F (36.8 C)-98.8 F (37.1 C)] 98.7 F (37.1 C) (02/17 0532) Pulse Rate:  [88-120] 99 (02/17 0532) Resp:  [16-18] 18 (02/17 0815) BP: (87-116)/(52-87) 116/87 mmHg (02/17 0532) SpO2:  [98 %-99 %] 98 % (02/17 0532)  Physical Exam:  General: alert and no distress Lochia: appropriate Uterine Fundus: firm Incision: healing well DVT Evaluation: No evidence of DVT seen on physical exam.   Recent Labs  08/24/12 0807 08/25/12 0525  HGB 12.2 9.4*  HCT 37.4 28.9*    Assessment/Plan: Status post Cesarean section. Doing well postoperatively.  Continue current care.  HARPER,CHARLES A 08/26/2012, 9:08 AM

## 2012-08-26 NOTE — Progress Notes (Signed)
Patient appointment has been made with Andrey Campanile RN at [facility] for Monday March 3 with Dr. Terrace Arabia at 10:30. Patient should arrive at 10:00 AM to do paper work.  If she has any questions please call [facility] [phone].    Sherry Morn PA-C [facility] [phone]  08/26/2012, 9:09 AM

## 2012-08-27 MED ORDER — IBUPROFEN 600 MG PO TABS: 600.0000 mg | ORAL_TABLET | Freq: Four times a day (QID) | ORAL | Status: DC | PRN

## 2012-08-27 MED ORDER — OXYCODONE-ACETAMINOPHEN 5-325 MG PO TABS: 1.0000 | ORAL_TABLET | ORAL | Status: DC | PRN

## 2012-08-27 NOTE — Discharge Summary (Signed)
Obstetric Discharge Summary Reason for Admission: cesarean section Prenatal Procedures: ultrasound Intrapartum Procedures: cesarean: low cervical, transverse Postpartum Procedures: none  MRI Complications-Operative and Postpartum: Intrapartum seizure Hemoglobin  Date Value Range Status  08/25/2012 9.4* 12.0 - 15.0 g/dL Final     REPEATED TO VERIFY     DELTA CHECK NOTED     HCT  Date Value Range Status  08/25/2012 28.9* 36.0 - 46.0 % Final    Physical Exam:  General: alert and no distress Lochia: appropriate Uterine Fundus: firm Incision: healing well DVT Evaluation: No evidence of DVT seen on physical exam.  Discharge Diagnoses: Term Pregnancy-delivered and seizure intrapartum and intracranial Sheppard/V Malformation.  Discharge Information: Date: 08/27/2012 Activity: pelvic rest Diet: routine Medications: PNV, Ibuprofen, Colace, Iron and Percocet Condition: stable Instructions: refer to practice specific booklet Discharge to: home Follow-up Information   Follow up with Sherry Malachowski A, MD. Schedule an appointment as soon as possible for Sheppard visit in 2 weeks.   Contact information:   [address] [place] [address] [phone]       Newborn Data: Live born female  Birth Weight: 8 lb 0.8 oz (3650 g) APGAR: 9, 9  Home with mother.  Sherry Sheppard Sheppard 08/27/2012, 9:01 AM

## 2012-08-27 NOTE — Progress Notes (Signed)
Subjective: Postpartum Day 3: Cesarean Delivery Patient reports tolerating PO, + flatus, + BM and no problems voiding.    Objective: Vital signs in last 24 hours: Temp:  [97.9 F (36.6 C)-99.3 F (37.4 C)] 98.3 F (36.8 C) (02/18 0511) Pulse Rate:  [94-114] 114 (02/18 0511) Resp:  [16-20] 20 (02/18 0511) BP: (103-111)/(66-77) 111/71 mmHg (02/18 0511) SpO2:  [97 %-99 %] 97 % (02/18 0158)  Physical Exam:  General: alert and no distress Lochia: appropriate Uterine Fundus: firm Incision: healing well DVT Evaluation: No evidence of DVT seen on physical exam.   Recent Labs  08/25/12 0525  HGB 9.4*  HCT 28.9*    Assessment/Plan: Status post Cesarean section. Doing well postoperatively.  Discharge home with standard precautions and return to clinic in 2 weeks.  HARPER,CHARLES A 08/27/2012, 8:53 AM

## 2012-08-28 ENCOUNTER — Inpatient Hospital Stay (HOSPITAL_COMMUNITY): Admission: RE | Admit: 2012-08-28 | Payer: 59 | Source: Ambulatory Visit

## 2012-08-30 ENCOUNTER — Inpatient Hospital Stay (HOSPITAL_COMMUNITY): Admission: RE | Admit: 2012-08-30 | Payer: 59 | Source: Ambulatory Visit | Admitting: Obstetrics

## 2012-08-30 SURGERY — Surgical Case
Anesthesia: Regional

## 2012-10-08 ENCOUNTER — Encounter: Payer: Self-pay | Admitting: Obstetrics

## 2012-10-08 ENCOUNTER — Ambulatory Visit (INDEPENDENT_AMBULATORY_CARE_PROVIDER_SITE_OTHER): Payer: 59 | Admitting: Obstetrics

## 2012-10-08 DIAGNOSIS — R569 Unspecified convulsions: Secondary | ICD-10-CM

## 2012-10-08 DIAGNOSIS — Z3009 Encounter for other general counseling and advice on contraception: Secondary | ICD-10-CM

## 2012-10-08 DIAGNOSIS — Q283 Other malformations of cerebral vessels: Secondary | ICD-10-CM

## 2012-10-08 DIAGNOSIS — Q282 Arteriovenous malformation of cerebral vessels: Secondary | ICD-10-CM

## 2012-10-08 DIAGNOSIS — Z3202 Encounter for pregnancy test, result negative: Secondary | ICD-10-CM

## 2012-10-08 DIAGNOSIS — Z309 Encounter for contraceptive management, unspecified: Secondary | ICD-10-CM

## 2012-10-08 DIAGNOSIS — Z87898 Personal history of other specified conditions: Secondary | ICD-10-CM | POA: Insufficient documentation

## 2012-10-08 LAB — POCT URINE PREGNANCY: Preg Test, Ur: NEGATIVE

## 2012-10-08 MED ORDER — NORETHINDRONE 0.35 MG PO TABS: 1.0000 | ORAL_TABLET | Freq: Every day | ORAL | Status: DC

## 2012-10-08 NOTE — Progress Notes (Signed)
Subjective:     Sherry Sheppard is a 27 y.o. female who presents for a postpartum visit. She is 6 week postpartum following a low cervical transverse Cesarean section. I have fully reviewed the prenatal and intrapartum course. The delivery was at 38 gestational weeks. Outcome: repeat cesarean section, low transverse incision. Anesthesia: spinal. Postpartum course has been normal. Baby's course has been normal. Baby is feeding by both breast and bottle - Carnation Good Start. Bleeding red. Bowel function is normal. Bladder function is normal. Patient is sexually active. Contraception method is condoms. Postpartum depression screening: negative.  The following portions of the patient's history were reviewed and updated as appropriate: allergies, current medications, past family history, past medical history, past social history, past surgical history and problem list.  Review of Systems A comprehensive review of systems was negative except for: Genitourinary: positive for None Neurological: positive for history of seizuresn followed by Neurology.  Has A-V Malformation.   Objective:    There were no vitals taken for this visit.  General:  alert and no distress   Breasts:  inspection negative, no nipple discharge or bleeding, no masses or nodularity palpable  Lungs: Not examined  Heart:  Not examined.  Abdomen: normal findings: soft, non-tender and incision clean.   Vulva:  normal  Vagina: normal vagina  Cervix:  no lesions  Corpus: normal size, contour, position, consistency, mobility, non-tender  Adnexa:  no mass, fullness, tenderness  Rectal Exam: Not performed.        Assessment:     Normal postpartum exam. Pap smear not done at today's visit.   Plan:    1. Contraception: condoms and considering Micronor.  2.F/U per Neurology for seizures. 3. Follow up in: 6 weeks or as needed.

## 2012-10-31 ENCOUNTER — Encounter: Payer: Self-pay | Admitting: Obstetrics

## 2012-11-08 ENCOUNTER — Encounter: Payer: Self-pay | Admitting: Obstetrics

## 2012-12-08 HISTORY — PX: BRAIN AVM REPAIR: SHX202

## 2013-01-09 ENCOUNTER — Ambulatory Visit: Payer: 59 | Admitting: Obstetrics

## 2013-01-14 ENCOUNTER — Encounter: Payer: Self-pay | Admitting: Obstetrics

## 2013-01-14 ENCOUNTER — Ambulatory Visit (INDEPENDENT_AMBULATORY_CARE_PROVIDER_SITE_OTHER): Payer: 59 | Admitting: Obstetrics

## 2013-01-14 VITALS — BP 113/74 | HR 76 | Temp 98.3°F | Ht 65.0 in | Wt 161.8 lb

## 2013-01-14 DIAGNOSIS — Z3202 Encounter for pregnancy test, result negative: Secondary | ICD-10-CM

## 2013-01-14 DIAGNOSIS — Z3009 Encounter for other general counseling and advice on contraception: Secondary | ICD-10-CM

## 2013-01-14 NOTE — Progress Notes (Addendum)
.   Subjective:     Sherry Sheppard is a 27 y.o. female here for a birth control consult.  No current complaints.  Personal health questionnaire reviewed: yes.   Gynecologic History No LMP recorded. Contraception: oral progesterone-only contraceptive    Obstetric History OB History   Grav Para Term Preterm Abortions TAB SAB Ect Mult Living   3 2 2  1  1   2      # Outc Date GA Lbr Len/2nd Wgt Sex Del Anes PTL Lv   1 TRM 9/09    M LTCS   Yes   Comments: c/S because FTP and fetal distress   2 SAB 2013           Comments: System Generated. Please review and update pregnancy details.   3 TRM 2/14 [redacted]w[redacted]d 00:00 8lb0.8oz(3.65kg) M CS-Vac Spinal  Yes       The following portions of the patient's history were reviewed and updated as appropriate: allergies, current medications, past family history, past medical history, past social history, past surgical history and problem list.  Review of Systems Pertinent items are noted in HPI.    Objective:    No exam performed today, Birth control consult only..    Assessment:    Postpartum contraception management.  Taking Micronor.  Has stopped breastfeeding.  Wants continued contraception, long term.   Plan:      Education reviewed: Contraceptive options with h/o seizures.. Contraception: oral progesterone-only contraceptive. Follow up in: several months. Wants ParaGuard IUD.    Referred for insertion.

## 2013-01-15 ENCOUNTER — Telehealth: Payer: Self-pay | Admitting: *Deleted

## 2013-01-15 DIAGNOSIS — Z0000 Encounter for general adult medical examination without abnormal findings: Secondary | ICD-10-CM

## 2013-01-15 NOTE — Telephone Encounter (Signed)
Message copied by Deatra James on Wed Jan 15, 2013  8:37 AM ------      Message from: Etheleen Sia      Created: Tue Jan 14, 2013  5:01 PM      Regarding: LAB       PHYSICAL LABS FOR AUG 13 APPT ------

## 2013-01-29 ENCOUNTER — Encounter: Payer: Self-pay | Admitting: Obstetrics

## 2013-02-19 ENCOUNTER — Encounter: Payer: Medicaid Other | Admitting: Internal Medicine

## 2013-02-21 ENCOUNTER — Encounter: Payer: Self-pay | Admitting: Internal Medicine

## 2013-02-21 ENCOUNTER — Other Ambulatory Visit (INDEPENDENT_AMBULATORY_CARE_PROVIDER_SITE_OTHER): Payer: 59

## 2013-02-21 ENCOUNTER — Ambulatory Visit (INDEPENDENT_AMBULATORY_CARE_PROVIDER_SITE_OTHER): Payer: 59 | Admitting: Internal Medicine

## 2013-02-21 VITALS — BP 98/68 | HR 91 | Temp 98.6°F | Wt 161.0 lb

## 2013-02-21 DIAGNOSIS — Z0000 Encounter for general adult medical examination without abnormal findings: Secondary | ICD-10-CM

## 2013-02-21 DIAGNOSIS — Z23 Encounter for immunization: Secondary | ICD-10-CM

## 2013-02-21 LAB — CBC WITH DIFFERENTIAL/PLATELET
Basophils Relative: 0.2 % (ref 0.0–3.0)
Eosinophils Absolute: 0.3 10*3/uL (ref 0.0–0.7)
Hemoglobin: 12.6 g/dL (ref 12.0–15.0)
MCHC: 32.9 g/dL (ref 30.0–36.0)
MCV: 81.5 fl (ref 78.0–100.0)
Monocytes Absolute: 0.7 10*3/uL (ref 0.1–1.0)
Neutro Abs: 6.6 10*3/uL (ref 1.4–7.7)
Neutrophils Relative %: 61.1 % (ref 43.0–77.0)
RBC: 4.72 Mil/uL (ref 3.87–5.11)

## 2013-02-21 LAB — URINALYSIS, ROUTINE W REFLEX MICROSCOPIC
Bilirubin Urine: NEGATIVE
Hgb urine dipstick: NEGATIVE
Ketones, ur: NEGATIVE
Urine Glucose: NEGATIVE
Urobilinogen, UA: 0.2 (ref 0.0–1.0)

## 2013-02-21 LAB — BASIC METABOLIC PANEL
CO2: 28 mEq/L (ref 19–32)
Chloride: 102 mEq/L (ref 96–112)
Creatinine, Ser: 0.7 mg/dL (ref 0.4–1.2)

## 2013-02-21 LAB — HEPATIC FUNCTION PANEL
ALT: 15 U/L (ref 0–35)
AST: 19 U/L (ref 0–37)
Bilirubin, Direct: 0.1 mg/dL (ref 0.0–0.3)
Total Bilirubin: 0.5 mg/dL (ref 0.3–1.2)

## 2013-02-21 LAB — LIPID PANEL
Total CHOL/HDL Ratio: 2
Triglycerides: 47 mg/dL (ref 0.0–149.0)

## 2013-02-21 MED ORDER — LEVONORGESTREL 20 MCG/24HR IU IUD: 1.0000 | INTRAUTERINE_SYSTEM | Freq: Once | INTRAUTERINE | Status: DC

## 2013-02-21 NOTE — Progress Notes (Signed)
  Subjective:    Patient ID: Sherry Sheppard, female    DOB: 1985-08-25, 27 y.o.   MRN: [id number]  HPI patient is here today for annual physical. Patient feels well and has no complaints.  Past Medical History  Diagnosis Date  . Allergic rhinitis   . Seizures     last event 08/24/12   Family History  Problem Relation Age of Onset  . Hyperlipidemia Mother   . Lung cancer Maternal Grandmother 67  . Hypertension Maternal Aunt   . Diabetes Cousin    History  Substance Use Topics  . Smoking status: Never Smoker   . Smokeless tobacco: Never Used  . Alcohol Use: No     Review of Systems Constitutional: Negative for fever or weight change.  Respiratory: Negative for cough and shortness of breath.   Cardiovascular: Negative for chest pain or palpitations.  Gastrointestinal: Negative for abdominal pain, no bowel changes.  Musculoskeletal: Negative for gait problem or joint swelling.  Skin: Negative for rash.  Neurological: Negative for dizziness or headache.  No other specific complaints in a complete review of systems (except as listed in HPI above).     Objective:   Physical Exam BP 98/68  Pulse 91  Temp(Src) 98.6 F (37 C) (Oral)  Wt 161 lb (73.029 kg)  BMI 26.79 kg/m2  SpO2 98% Wt Readings from Last 3 Encounters:  02/21/13 161 lb (73.029 kg)  01/14/13 161 lb 12.8 oz (73.392 kg)  10/08/12 142 lb (64.411 kg)   Constitutional: She appears well-developed and well-nourished. No distress.  HENT: Head: Normocephalic and atraumatic. Ears: B TMs ok, no erythema or effusion; Nose: Nose normal. Mouth/Throat: Oropharynx is clear and moist. No oropharyngeal exudate.  Eyes: Conjunctivae and EOM are normal. Pupils are equal, round, and reactive to light. No scleral icterus.  Neck: Normal range of motion. Neck supple. No JVD present. No thyromegaly present.  Cardiovascular: Normal rate, regular rhythm and normal heart sounds.  No murmur heard. No BLE edema. Pulmonary/Chest: Effort  normal and breath sounds normal. No respiratory distress. She has no wheezes.  Abdominal: Soft. Bowel sounds are normal. She exhibits no distension. There is no tenderness. no masses Musculoskeletal: Normal range of motion, no joint effusions. No gross deformities Neurological: She is alert and oriented to person, place, and time. No cranial nerve deficit. Coordination, balance, strength, speech and gait are normal.  Skin: Skin is warm and dry. No rash noted. No erythema.  Psychiatric: She has a normal mood and affect. Her behavior is normal. Judgment and thought content normal.   Lab Results  Component Value Date   WBC 12.6* 08/25/2012   HGB 9.4* 08/25/2012   HCT 28.9* 08/25/2012   PLT 192 08/25/2012   GLUCOSE 77 06/29/2011   CHOL 170 06/29/2011   TRIG 54.0 06/29/2011   HDL 78.30 06/29/2011   LDLCALC 81 06/29/2011   ALT 25 06/29/2011   AST 28 06/29/2011   NA 140 06/29/2011   K 3.8 06/29/2011   CL 104 06/29/2011   CREATININE 0.7 06/29/2011   BUN 8 06/29/2011   CO2 28 06/29/2011   TSH 0.65 06/29/2011        Assessment & Plan:   CPX/v70.0 - Patient has been counseled on age-appropriate routine health concerns for screening and prevention. These are reviewed and up-to-date. Immunizations are up-to-date or declined. Labs ordered and reviewed.

## 2013-02-21 NOTE — Patient Instructions (Signed)
It was good to see you today. We have reviewed your prior records including labs and tests today Health Maintenance reviewed - Tdap updated today - all recommended immunizations and age-appropriate screenings are up-to-date. Test(s) ordered today. Your results will be released to MyChart (or called to you) after review, usually within 72hours after test completion. If any changes need to be made, you will be notified at that same time. Medications reviewed and updated, no changes recommended at this time. Please schedule followup in 1-2 years for annual and labs, call sooner if problems.   Health Maintenance, Females A healthy lifestyle and preventative care can promote health and wellness.  Maintain regular health, dental, and eye exams.  Eat a healthy diet. Foods like vegetables, fruits, whole grains, low-fat dairy products, and lean protein foods contain the nutrients you need without too many calories. Decrease your intake of foods high in solid fats, added sugars, and salt. Get information about a proper diet from your caregiver, if necessary.  Regular physical exercise is one of the most important things you can do for your health. Most adults should get at least 150 minutes of moderate-intensity exercise (any activity that increases your heart rate and causes you to sweat) each week. In addition, most adults need muscle-strengthening exercises on 2 or more days a week.   Maintain a healthy weight. The body mass index (BMI) is a screening tool to identify possible weight problems. It provides an estimate of body fat based on height and weight. Your caregiver can help determine your BMI, and can help you achieve or maintain a healthy weight. For adults 20 years and older:  A BMI below 18.5 is considered underweight.  A BMI of 18.5 to 24.9 is normal.  A BMI of 25 to 29.9 is considered overweight.  A BMI of 30 and above is considered obese.  Maintain normal blood lipids and cholesterol  by exercising and minimizing your intake of saturated fat. Eat a balanced diet with plenty of fruits and vegetables. Blood tests for lipids and cholesterol should begin at age 20 and be repeated every 5 years. If your lipid or cholesterol levels are high, you are over 50, or you are a high risk for heart disease, you may need your cholesterol levels checked more frequently.Ongoing high lipid and cholesterol levels should be treated with medicines if diet and exercise are not effective.  If you smoke, find out from your caregiver how to quit. If you do not use tobacco, do not start.  If you are pregnant, do not drink alcohol. If you are breastfeeding, be very cautious about drinking alcohol. If you are not pregnant and choose to drink alcohol, do not exceed 1 drink per day. One drink is considered to be 12 ounces (355 mL) of beer, 5 ounces (148 mL) of wine, or 1.5 ounces (44 mL) of liquor.  Avoid use of street drugs. Do not share needles with anyone. Ask for help if you need support or instructions about stopping the use of drugs.  High blood pressure causes heart disease and increases the risk of stroke. Blood pressure should be checked at least every 1 to 2 years. Ongoing high blood pressure should be treated with medicines, if weight loss and exercise are not effective.  If you are 78 to 26 years old, ask your caregiver if you should take aspirin to prevent strokes.  Diabetes screening involves taking a blood sample to check your fasting blood sugar level. This should be done once  every 3 years, after age 42, if you are within normal weight and without risk factors for diabetes. Testing should be considered at a younger age or be carried out more frequently if you are overweight and have at least 1 risk factor for diabetes.  Breast cancer screening is essential preventative care for women. You should practice "breast self-awareness." This means understanding the normal appearance and feel of your  breasts and may include breast self-examination. Any changes detected, no matter how small, should be reported to a caregiver. Women in their 72s and 30s should have a clinical breast exam (CBE) by a caregiver as part of a regular health exam every 1 to 3 years. After age 51, women should have a CBE every year. Starting at age 48, women should consider having a mammogram (breast X-ray) every year. Women who have a family history of breast cancer should talk to their caregiver about genetic screening. Women at a high risk of breast cancer should talk to their caregiver about having an MRI and a mammogram every year.  The Pap test is a screening test for cervical cancer. Women should have a Pap test starting at age 83. Between ages 38 and 10, Pap tests should be repeated every 2 years. Beginning at age 53, you should have a Pap test every 3 years as long as the past 3 Pap tests have been normal. If you had a hysterectomy for a problem that was not cancer or a condition that could lead to cancer, then you no longer need Pap tests. If you are between ages 86 and 56, and you have had normal Pap tests going back 10 years, you no longer need Pap tests. If you have had past treatment for cervical cancer or a condition that could lead to cancer, you need Pap tests and screening for cancer for at least 20 years after your treatment. If Pap tests have been discontinued, risk factors (such as a new sexual partner) need to be reassessed to determine if screening should be resumed. Some women have medical problems that increase the chance of getting cervical cancer. In these cases, your caregiver may recommend more frequent screening and Pap tests.  The human papillomavirus (HPV) test is an additional test that may be used for cervical cancer screening. The HPV test looks for the virus that can cause the cell changes on the cervix. The cells collected during the Pap test can be tested for HPV. The HPV test could be used to  screen women aged 64 years and older, and should be used in women of any age who have unclear Pap test results. After the age of 7, women should have HPV testing at the same frequency as a Pap test.  Colorectal cancer can be detected and often prevented. Most routine colorectal cancer screening begins at the age of 4 and continues through age 30. However, your caregiver may recommend screening at an earlier age if you have risk factors for colon cancer. On a yearly basis, your caregiver may provide home test kits to check for hidden blood in the stool. Use of a small camera at the end of a tube, to directly examine the colon (sigmoidoscopy or colonoscopy), can detect the earliest forms of colorectal cancer. Talk to your caregiver about this at age 64, when routine screening begins. Direct examination of the colon should be repeated every 5 to 10 years through age 57, unless early forms of pre-cancerous polyps or small growths are found.  Hepatitis  C blood testing is recommended for all people born from [birth year] through 1964 and any individual with known risks for hepatitis C.  Practice safe sex. Use condoms and avoid high-risk sexual practices to reduce the spread of sexually transmitted infections (STIs). Sexually active women aged 22 and younger should be checked for Chlamydia, which is a common sexually transmitted infection. Older women with new or multiple partners should also be tested for Chlamydia. Testing for other STIs is recommended if you are sexually active and at increased risk.  Osteoporosis is a disease in which the bones lose minerals and strength with aging. This can result in serious bone fractures. The risk of osteoporosis can be identified using a bone density scan. Women ages 37 and over and women at risk for fractures or osteoporosis should discuss screening with their caregivers. Ask your caregiver whether you should be taking a calcium supplement or vitamin D to reduce the rate of  osteoporosis.  Menopause can be associated with physical symptoms and risks. Hormone replacement therapy is available to decrease symptoms and risks. You should talk to your caregiver about whether hormone replacement therapy is right for you.  Use sunscreen with a sun protection factor (SPF) of 30 or greater. Apply sunscreen liberally and repeatedly throughout the day. You should seek shade when your shadow is shorter than you. Protect yourself by wearing long sleeves, pants, a wide-brimmed hat, and sunglasses year round, whenever you are outdoors.  Notify your caregiver of new moles or changes in moles, especially if there is a change in shape or color. Also notify your caregiver if a mole is larger than the size of a pencil eraser.  Stay current with your immunizations. Document Released: 01/09/2011 Document Revised: 09/18/2011 Document Reviewed: 01/09/2011 [facility] Patient Information 2014 [place], [place].

## 2013-05-15 ENCOUNTER — Other Ambulatory Visit: Payer: Self-pay

## 2013-07-11 ENCOUNTER — Encounter: Payer: Self-pay | Admitting: Family Medicine

## 2013-07-11 ENCOUNTER — Ambulatory Visit (INDEPENDENT_AMBULATORY_CARE_PROVIDER_SITE_OTHER): Payer: 59 | Admitting: Family Medicine

## 2013-07-11 VITALS — BP 94/70 | HR 70 | Temp 99.4°F | Wt 157.0 lb

## 2013-07-11 DIAGNOSIS — J309 Allergic rhinitis, unspecified: Secondary | ICD-10-CM

## 2013-07-11 DIAGNOSIS — E049 Nontoxic goiter, unspecified: Secondary | ICD-10-CM

## 2013-07-11 MED ORDER — AMOXICILLIN 875 MG PO TABS: 875.0000 mg | ORAL_TABLET | Freq: Two times a day (BID) | ORAL | Status: DC

## 2013-07-11 MED ORDER — BENZONATATE 200 MG PO CAPS: 200.0000 mg | ORAL_CAPSULE | Freq: Three times a day (TID) | ORAL | Status: DC | PRN

## 2013-07-11 MED ORDER — FLUTICASONE PROPIONATE 50 MCG/ACT NA SUSP: 2.0000 | Freq: Every day | NASAL | Status: DC

## 2013-07-11 NOTE — Progress Notes (Signed)
SUBJECTIVE:  Sherry Sheppard is a 28 y.o. female who complains of congestion, sore throat, nasal blockage, post nasal drip and dry cough for 28 days. She denies a history of chest pain, dizziness, fatigue, fevers, shortness of breath and weight loss and denies a history of asthma. Patient denies smoke cigarettes. + sick contacts in house.   Denies fever, chills, nausea vomiting abdominal pain, dysuria, chest pain, shortness of breath dyspnea on exertion or numbness in extremities  Past medical history, social, surgical and family history all reviewed.    OBJECTIVE: Blood pressure 94/70, pulse 70, temperature 99.4 F (37.4 C), temperature source Oral, weight 157 lb (71.215 kg), SpO2 98.00%.  She appears well, vital signs are as noted. Ears normal mild air-fluid levels in each tympanic membrane with no signs of infection  Throat and pharynx mild erythema with positive postnasal drip.  Neck supple the patient does have mild enlargement of the thyroid. No adenopathy in the neck. Nose is congested. Sinuses non tender. The chest is clear, without wheezes or rales. Cardiovascular regular and rhythm no murmur  ASSESSMENT:  allergic rhinitis  PLAN: Symptomatic therapy suggested: push fluids, rest and return office visit prn if symptoms persist or worsen. Lack of antibiotic effectiveness discussed with her. Patient was given a wait and see prescription of amoxicillin. Medications per orders including Flonase and Tessalon Perles. Call or return to clinic prn if these symptoms worsen or fail to improve as anticipated.  Patient also had some mild enlargement of the thyroid but no nodules present. Patient followup with primary care provider.

## 2013-07-11 NOTE — Progress Notes (Signed)
Pre-visit discussion using our clinic review tool. No additional management support is needed unless otherwise documented below in the visit note.  

## 2013-07-11 NOTE — Patient Instructions (Addendum)
Very nice to meet you Try amoxicillin twice daily for next 10 days.  Lets try a nose spray to see if this is occuring continuously due to allergies.  Tessalon perles for you cough.  Teaspoon of room temperature honey could help as well before bed.  Meds are sent to your pharmacy. If not better in 2 weeks come back.

## 2013-09-08 ENCOUNTER — Ambulatory Visit (INDEPENDENT_AMBULATORY_CARE_PROVIDER_SITE_OTHER): Payer: 59 | Admitting: Family Medicine

## 2013-09-08 ENCOUNTER — Encounter: Payer: Self-pay | Admitting: Family Medicine

## 2013-09-08 ENCOUNTER — Other Ambulatory Visit (INDEPENDENT_AMBULATORY_CARE_PROVIDER_SITE_OTHER): Payer: 59

## 2013-09-08 VITALS — BP 116/60 | HR 79 | Temp 98.1°F | Resp 16 | Wt 150.6 lb

## 2013-09-08 DIAGNOSIS — E010 Iodine-deficiency related diffuse (endemic) goiter: Secondary | ICD-10-CM

## 2013-09-08 DIAGNOSIS — R5381 Other malaise: Secondary | ICD-10-CM

## 2013-09-08 DIAGNOSIS — R5383 Other fatigue: Secondary | ICD-10-CM

## 2013-09-08 DIAGNOSIS — E049 Nontoxic goiter, unspecified: Secondary | ICD-10-CM

## 2013-09-08 LAB — IBC PANEL
Iron: 29 ug/dL — ABNORMAL LOW (ref 42–145)
SATURATION RATIOS: 7.8 % — AB (ref 20.0–50.0)
TRANSFERRIN: 264.4 mg/dL (ref 212.0–360.0)

## 2013-09-08 LAB — T3, FREE: T3 FREE: 2.8 pg/mL (ref 2.3–4.2)

## 2013-09-08 LAB — CBC WITH DIFFERENTIAL/PLATELET
BASOS ABS: 0 10*3/uL (ref 0.0–0.1)
BASOS PCT: 0.3 % (ref 0.0–3.0)
EOS ABS: 0.2 10*3/uL (ref 0.0–0.7)
Eosinophils Relative: 1.7 % (ref 0.0–5.0)
HCT: 38.6 % (ref 36.0–46.0)
Hemoglobin: 12.3 g/dL (ref 12.0–15.0)
Lymphocytes Relative: 35 % (ref 12.0–46.0)
Lymphs Abs: 3.8 10*3/uL (ref 0.7–4.0)
MCHC: 31.9 g/dL (ref 30.0–36.0)
MCV: 83.4 fl (ref 78.0–100.0)
MONO ABS: 0.6 10*3/uL (ref 0.1–1.0)
Monocytes Relative: 5.6 % (ref 3.0–12.0)
NEUTROS ABS: 6.2 10*3/uL (ref 1.4–7.7)
NEUTROS PCT: 57.4 % (ref 43.0–77.0)
Platelets: 331 10*3/uL (ref 150.0–400.0)
RBC: 4.62 Mil/uL (ref 3.87–5.11)
RDW: 14.9 % — AB (ref 11.5–14.6)
WBC: 10.7 10*3/uL — ABNORMAL HIGH (ref 4.5–10.5)

## 2013-09-08 LAB — T4, FREE: Free T4: 0.83 ng/dL (ref 0.60–1.60)

## 2013-09-08 LAB — FERRITIN: Ferritin: 44 ng/mL (ref 10.0–291.0)

## 2013-09-08 LAB — TSH: TSH: 1.43 u[IU]/mL (ref 0.35–5.50)

## 2013-09-08 NOTE — Progress Notes (Signed)
SUBJECTIVE:  Sherry Sheppard is a [age] y.o. female who complains of congestion, sore throat, nasal blockage, post nasal drip and dry cough for going on 2 months now. . She denies a history of chest pain, dizziness, , fevers, shortness of breath and weight loss and denies a history of asthma. Patient denies smoke cigarettes. + sick contacts in house. Patient states though that she has not made any significant improvement Flonase, antihistamines, as well as antibiotics that she's been given. Has noticed more fatigue recently as well.  Denies fever, chills, nausea vomiting abdominal pain, dysuria, chest pain, shortness of breath dyspnea on exertion or numbness in extremities  Past medical history, social, surgical and family history all reviewed.    OBJECTIVE: Blood pressure 116/60, pulse 79, temperature 98.1 F (36.7 C), temperature source Oral, resp. rate 16, weight 150 lb 9.6 oz (68.312 kg), SpO2 99.00%.  She appears well, vital signs are as noted. Ears normal mild air-fluid levels in each tympanic membrane with no signs of infection  Throat and pharynx mild erythema with positive postnasal drip.  Neck supple the patient does have mild enlargement of the thyroid. No adenopathy in the neck. Nose is congested. Sinuses non tender. The chest is clear, without wheezes or rales. Cardiovascular regular and rhythm no murmur  ASSESSMENT:  allergic rhinitis, chronic fatigue, thyromegaly  PLAN: I would like the last at this point. We will get a thyroid panel, iron panel as well as a CBC. This will tell us if there is any other type of infectious etiology or thyroid pathology that could be contributing. Discuss the patient is well with her as well as her son being the only sick people living together I would be concerned for an environmental problem. Patient will look into this as well. Patient will followup with primary care provider in 2-3 weeks.

## 2013-09-08 NOTE — Patient Instructions (Signed)
Good to see you We will check some labs today to make sure everything is ok Start iron 325 mg daily Vitamin D 2000 IU daily.  Continue the flonase. Try a mold kit at your home.  Come back and see Dr. Carlean Jews. In 2-3 weeks.

## 2013-09-08 NOTE — Progress Notes (Signed)
Pre visit review using our clinic review tool, if applicable. No additional management support is needed unless otherwise documented below in the visit note. 

## 2013-10-03 ENCOUNTER — Ambulatory Visit: Payer: 59 | Admitting: Internal Medicine

## 2013-11-17 ENCOUNTER — Encounter: Payer: Self-pay | Admitting: Physician Assistant

## 2013-11-17 ENCOUNTER — Ambulatory Visit (INDEPENDENT_AMBULATORY_CARE_PROVIDER_SITE_OTHER): Payer: 59 | Admitting: Physician Assistant

## 2013-11-17 VITALS — BP 110/80 | HR 107 | Temp 99.0°F | Resp 16 | Wt 149.0 lb

## 2013-11-17 DIAGNOSIS — J0100 Acute maxillary sinusitis, unspecified: Secondary | ICD-10-CM

## 2013-11-17 DIAGNOSIS — R51 Headache: Secondary | ICD-10-CM

## 2013-11-17 MED ORDER — DOXYCYCLINE HYCLATE 100 MG PO TABS: 100.0000 mg | ORAL_TABLET | Freq: Two times a day (BID) | ORAL | Status: DC

## 2013-11-17 NOTE — Progress Notes (Signed)
Pre visit review using our clinic review tool, if applicable. No additional management support is needed unless otherwise documented below in the visit note. 

## 2013-11-17 NOTE — Patient Instructions (Signed)
Doxycycline twice a day for 10 days for sinus infection.  Continue taking Flonase and Zyrtec as directed.  Followup in one week to assure symptom improvement.  Sinusitis Sinusitis is redness, soreness, and puffiness (inflammation) of the air pockets in the bones of your face (sinuses). The redness, soreness, and puffiness can cause air and mucus to get trapped in your sinuses. This can allow germs to grow and cause an infection.  HOME CARE   Drink enough fluids to keep your pee (urine) clear or pale yellow.  Use a humidifier in your home.  Run a hot shower to create steam in the bathroom. Sit in the bathroom with the door closed. Breathe in the steam 3 4 times a day.  Put a warm, moist washcloth on your face 3 4 times a day, or as told by your doctor.  Use salt water sprays (saline sprays) to wet the thick fluid in your nose. This can help the sinuses drain.  Only take medicine as told by your doctor. GET HELP RIGHT AWAY IF:   Your pain gets worse.  You have very bad headaches.  You are sick to your stomach (nauseous).  You throw up (vomit).  You are very sleepy (drowsy) all the time.  Your face is puffy (swollen).  Your vision changes.  You have a stiff neck.  You have trouble breathing. MAKE SURE YOU:   Understand these instructions.  Will watch your condition.  Will get help right away if you are not doing well or get worse. Document Released: 12/13/2007 Document Revised: 03/20/2012 Document Reviewed: 01/30/2012 [facility] Patient Information 2014 [place], [place].

## 2013-11-17 NOTE — Progress Notes (Signed)
Subjective:    Patient ID: Sherry Sheppard, female    DOB: 05/10/1986, 28 y.o.   MRN: [id number]  Sinusitis This is a new problem. The current episode started in the past 7 days. The problem has been gradually worsening since onset. There has been no fever. The pain is mild. Associated symptoms include congestion, coughing (dry), headaches (started this morning, migraine like.), sinus pressure, sneezing and a sore throat. Pertinent negatives include no chills, diaphoresis, ear pain, hoarse voice, neck pain, shortness of breath or swollen glands. Treatments tried: Flonase and Zyrtec. The treatment provided no relief.  Pt has history of AVM and states she called her neurosurgeon this morning to ask if the new onset headache was related. Her neurosurgeon told her to come into the clinic and thought that her headache was more sinus related. States she has tried Aleve Cold and Sinus and has had mild relief.   Review of Systems  Constitutional: Negative for fever, chills and diaphoresis.  HENT: Positive for congestion, sinus pressure, sneezing and sore throat. Negative for ear pain and hoarse voice.   Eyes: Positive for photophobia (states some lights are hurting eyes.).  Respiratory: Positive for cough (dry). Negative for shortness of breath.   Gastrointestinal: Negative for nausea, vomiting and diarrhea.  Musculoskeletal: Negative for neck pain.  Neurological: Positive for headaches (started this morning, migraine like.).  All other systems reviewed and are negative.  Past Medical History  Diagnosis Date  . Allergic rhinitis   . Seizures     last event 08/24/12   Past Surgical History  Procedure Laterality Date  . Cesarean section  2009  . Tooth extraction  2012  . Dilation and evacuation  09/20/2011    Procedure: DILATATION AND EVACUATION;  Surgeon: Brock Badharles A Harper, MD;  Location: [facility];  Service: Gynecology;  Laterality: N/A;  . Cesarean section N/A 08/24/2012    Procedure: CESAREAN  SECTION;  Surgeon: Kathreen CosierBernard A Marshall, MD;  Location: [facility];  Service: Obstetrics;  Laterality: N/A;  . Brain avm repair  12/2012    [facility]    reports that she has never smoked. She has never used smokeless tobacco. She reports that she does not drink alcohol or use illicit drugs. family history includes Diabetes in her cousin; Hyperlipidemia in her mother; Hypertension in her maternal aunt; Lung cancer (age of onset: 7758) in her maternal grandmother. Allergies  Allergen Reactions  . Banana Shortness Of Breath       Objective:   Physical Exam  Nursing note and vitals reviewed. Constitutional: She is oriented to person, place, and time. She appears well-developed and well-nourished. No distress.  HENT:  Head: Normocephalic and atraumatic.  Right Ear: External ear normal.  Left Ear: External ear normal.  Nose: Nose normal.  Mouth/Throat: No oropharyngeal exudate.  Oropharynx is slightly erythematous, no exudate.  Bilateral tympanic membranes appear normal.  Bilateral maxillary sinuses are exquisitely tender to palpation. Bilateral frontal sinuses nontender to palpation.    Eyes: Conjunctivae and EOM are normal. Pupils are equal, round, and reactive to light.  Neck: Normal range of motion. Neck supple. No JVD present. No tracheal deviation present.  Cardiovascular: Normal rate, regular rhythm, normal heart sounds and intact distal pulses.  Exam reveals no gallop and no friction rub.   No murmur heard. Pulmonary/Chest: Effort normal and breath sounds normal. No stridor. No respiratory distress. She has no wheezes. She has no rales. She exhibits no tenderness.  Musculoskeletal: Normal range of  motion.  Lymphadenopathy:    She has no cervical adenopathy.  Neurological: She is alert and oriented to person, place, and time.  Skin: Skin is warm and dry. No rash noted. She is not diaphoretic. No erythema. No pallor.  Psychiatric: She has a normal mood and affect. Her  behavior is normal. Judgment and thought content normal.    Filed Vitals:   11/17/13 1157  BP: 110/80  Pulse: 107  Temp: 99 F (37.2 C)  Resp: 16   Lab Results  Component Value Date   WBC 10.7* 09/08/2013   HGB 12.3 09/08/2013   HCT 38.6 09/08/2013   PLT 331.0 09/08/2013   GLUCOSE 66* 02/21/2013   CHOL 153 02/21/2013   TRIG 47.0 02/21/2013   HDL 63.80 02/21/2013   LDLCALC 80 02/21/2013   ALT 15 02/21/2013   AST 19 02/21/2013   NA 136 02/21/2013   K 4.0 02/21/2013   CL 102 02/21/2013   CREATININE 0.7 02/21/2013   BUN 13 02/21/2013   CO2 28 02/21/2013   TSH 1.43 09/08/2013       Assessment & Plan:  Sherry Sheppard was seen today for sinusitis.  Diagnoses and associated orders for this visit:  Sinusitis, acute maxillary - doxycycline (VIBRA-TABS) 100 MG tablet; Take 1 tablet (100 mg total) by mouth 2 (two) times daily.  Headache(784.0) - Most likely due to sinus pressure and pain from Sinusitis. Treatment for sinusitis will most likely improve symptoms. Follow Up in 1 week to reassess.   Plan to reassess in 1 week.   Patient Instructions  Doxycycline twice a day for 10 days for sinus infection.  Continue taking Flonase and Zyrtec as directed.  Followup in one week to assure symptom improvement.

## 2014-01-15 ENCOUNTER — Encounter: Payer: Self-pay | Admitting: Obstetrics

## 2014-01-15 ENCOUNTER — Ambulatory Visit (INDEPENDENT_AMBULATORY_CARE_PROVIDER_SITE_OTHER): Payer: 59 | Admitting: Obstetrics

## 2014-01-15 VITALS — BP 114/79 | HR 99 | Temp 98.5°F | Ht 65.0 in | Wt 151.0 lb

## 2014-01-15 DIAGNOSIS — Z0000 Encounter for general adult medical examination without abnormal findings: Secondary | ICD-10-CM

## 2014-01-15 DIAGNOSIS — Z01419 Encounter for gynecological examination (general) (routine) without abnormal findings: Secondary | ICD-10-CM

## 2014-01-15 MED ORDER — PNV PRENATAL PLUS MULTIVITAMIN 27-1 MG PO TABS: 1.0000 | ORAL_TABLET | Freq: Every day | ORAL | Status: DC

## 2014-01-16 ENCOUNTER — Encounter: Payer: Self-pay | Admitting: Obstetrics

## 2014-01-16 LAB — GC/CHLAMYDIA PROBE AMP
CT Probe RNA: NEGATIVE
GC Probe RNA: NEGATIVE

## 2014-01-16 LAB — WET PREP BY MOLECULAR PROBE
Candida species: NEGATIVE
GARDNERELLA VAGINALIS: NEGATIVE
Trichomonas vaginosis: NEGATIVE

## 2014-01-16 NOTE — Progress Notes (Signed)
Subjective:     Sherry Sheppard is a 28 y.o. female here for a routine exam.  Current complaints: None.    Personal health questionnaire:  Is patient Sherry Sheppard, have a family history of breast and/or ovarian cancer: no Is there a family history of uterine cancer diagnosed at age < 5150, gastrointestinal cancer, urinary tract cancer, family member who is a Personnel officerLynch syndrome-associated carrier: no Is the patient overweight and hypertensive, family history of diabetes, personal history of gestational diabetes or PCOS: no Is patient over 4855, have PCOS,  family history of premature CHD under age 27, diabetes, smoke, have hypertension or peripheral artery disease:  no  The HPI was reviewed and explored in further detail by the provider. Gynecologic History No LMP recorded. Patient is not currently having periods (Reason: IUD). Contraception: IUD Last Pap: 2014. Results were: normal Last mammogram: n/a. Results were: n/a  Obstetric History OB History  Gravida Para Term Preterm AB SAB TAB Ectopic Multiple Living  3 2 2  1 1    2     # Outcome Date GA Lbr Len/2nd Weight Sex Delivery Anes PTL Lv  3 TRM 08/24/12 [id number]  8 lb 0.8 oz (3.65 kg) M CS-Vac Spinal  Y  2 SAB 2013             Comments: System Generated. Please review and update pregnancy details.  1 TRM 04/07/08    M LTCS   Y     Comments: c/S because FTP and fetal distress      Past Medical History  Diagnosis Date  . Allergic rhinitis   . Seizures     last event 08/24/12    Past Surgical History  Procedure Laterality Date  . Cesarean section  2009  . Tooth extraction  2012  . Dilation and evacuation  09/20/2011    Procedure: DILATATION AND EVACUATION;  Surgeon: Brock Badharles A Judiann Celia, MD;  Location: [facility];  Service: Gynecology;  Laterality: N/A;  . Cesarean section N/A 08/24/2012    Procedure: CESAREAN SECTION;  Surgeon: Kathreen CosierBernard A Marshall, MD;  Location: [facility];  Service: Obstetrics;  Laterality: N/A;  . 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    [facility]    Current outpatient prescriptions:fluticasone (FLONASE) 50 MCG/ACT nasal spray, Place 2 sprays into both nostrils daily., Disp: 16 g, Rfl: 6;  ibuprofen (ADVIL,MOTRIN) 600 MG tablet, Take 1 tablet (600 mg total) by mouth every 6 (six) hours as needed for pain., Disp: 30 tablet, Rfl: 5;  Iron-FA-B Cmp-C-Biot-Probiotic (FUSION PLUS) CAPS, Take 1 capsule by mouth daily., Disp: , Rfl:  levonorgestrel (MIRENA) 20 MCG/24HR IUD, 1 Intra Uterine Device (1 each total) by Intrauterine route once., Disp: 1 each, Rfl: 0;  benzonatate (TESSALON) 200 MG capsule, Take 1 capsule (200 mg total) by mouth 3 (three) times daily as needed for cough., Disp: 20 capsule, Rfl: 0;  Prenat-FeFum-FePo-FA-Omega 3 (TARON-C DHA PO), Take 1 tablet by mouth daily., Disp: , Rfl:  Prenatal Vit-Fe Fumarate-FA (PNV PRENATAL PLUS MULTIVITAMIN) 27-1 MG TABS, Take 1 tablet by mouth daily before breakfast., Disp: 30 tablet, Rfl: 11 Allergies  Allergen Reactions  . Banana Shortness Of Breath    History  Substance Use Topics  . Smoking status: Never Smoker   . Smokeless tobacco: Never Used  . Alcohol Use: No    Family History  Problem Relation Age of Onset  . Hyperlipidemia Mother   . Lung cancer Maternal Grandmother [id number]  . Hypertension Maternal Aunt   .  Diabetes Cousin       Review of Systems  Constitutional: negative for fatigue and weight loss Respiratory: negative for cough and wheezing Cardiovascular: negative for chest pain, fatigue and palpitations Gastrointestinal: negative for abdominal pain and change in bowel habits Musculoskeletal:negative for myalgias Neurological: negative for gait problems and tremors Behavioral/Psych: negative for abusive relationship, depression Endocrine: negative for temperature intolerance   Genitourinary:negative for abnormal menstrual periods, genital lesions, hot flashes, sexual problems and vaginal discharge Integument/breast: negative for  breast lump, breast tenderness, nipple discharge and skin lesion(s)    Objective:       BP 114/79  Pulse 99  Temp(Src) 98.5 F (36.9 C)  Ht 5\' 5"  (1.651 m)  Wt 151 lb (68.493 kg)  BMI 25.13 kg/m2 General:   alert  Skin:   no rash or abnormalities  Lungs:   clear to auscultation bilaterally  Heart:   regular rate and rhythm, S1, S2 normal, no murmur, click, rub or gallop  Breasts:   normal without suspicious masses, skin or nipple changes or axillary nodes  Abdomen:  normal findings: no organomegaly, soft, non-tender and no hernia  Pelvis:  External genitalia: normal general appearance Urinary system: urethral meatus normal and bladder without fullness, nontender Vaginal: normal without tenderness, induration or masses Cervix: normal appearance Adnexa: normal bimanual exam Uterus: anteverted and non-tender, normal size   Lab Review Urine pregnancy test Labs reviewed yes Radiologic studies reviewed no   Assessment:    Healthy female exam.    Plan:    Education reviewed: low fat, low cholesterol diet, safe sex/STD prevention, self breast exams and weight bearing exercise. Contraception: IUD. Follow up in: 1 year.   Meds ordered this encounter  Medications  . Prenatal Vit-Fe Fumarate-FA (PNV PRENATAL PLUS MULTIVITAMIN) 27-1 MG TABS    Sig: Take 1 tablet by mouth daily before breakfast.    Dispense:  30 tablet    Refill:  11   Orders Placed This Encounter  Procedures  . WET PREP BY MOLECULAR PROBE  . GC/Chlamydia Probe Amp

## 2014-01-19 LAB — PAP IG W/ RFLX HPV ASCU

## 2014-02-23 ENCOUNTER — Encounter: Payer: Self-pay | Admitting: Internal Medicine

## 2014-03-19 ENCOUNTER — Telehealth: Payer: Self-pay | Admitting: *Deleted

## 2014-03-19 ENCOUNTER — Encounter: Payer: Self-pay | Admitting: Internal Medicine

## 2014-03-19 NOTE — Telephone Encounter (Signed)
Left msg on triage stating she has a sinus infection. Requesting md to rx antibiotic. Called pt back no answer LMOM md out of office & before md can rx something will need to make appt to be evaluated...Raechel Chute

## 2014-04-02 ENCOUNTER — Ambulatory Visit (INDEPENDENT_AMBULATORY_CARE_PROVIDER_SITE_OTHER): Payer: 59 | Admitting: *Deleted

## 2014-04-02 DIAGNOSIS — Z23 Encounter for immunization: Secondary | ICD-10-CM

## 2014-05-11 ENCOUNTER — Encounter: Payer: Self-pay | Admitting: Obstetrics

## 2014-06-09 ENCOUNTER — Encounter: Payer: 59 | Admitting: Internal Medicine

## 2014-08-03 ENCOUNTER — Encounter: Payer: 59 | Admitting: Internal Medicine

## 2014-08-03 ENCOUNTER — Encounter: Payer: Self-pay | Admitting: Internal Medicine

## 2014-08-03 ENCOUNTER — Ambulatory Visit (INDEPENDENT_AMBULATORY_CARE_PROVIDER_SITE_OTHER): Payer: 59 | Admitting: Internal Medicine

## 2014-08-03 VITALS — BP 108/62 | HR 78 | Temp 98.4°F | Resp 16 | Ht 64.0 in | Wt 151.4 lb

## 2014-08-03 DIAGNOSIS — J309 Allergic rhinitis, unspecified: Secondary | ICD-10-CM | POA: Insufficient documentation

## 2014-08-03 DIAGNOSIS — R519 Headache, unspecified: Secondary | ICD-10-CM

## 2014-08-03 DIAGNOSIS — Q283 Other malformations of cerebral vessels: Secondary | ICD-10-CM

## 2014-08-03 DIAGNOSIS — R51 Headache: Secondary | ICD-10-CM

## 2014-08-03 DIAGNOSIS — J3089 Other allergic rhinitis: Secondary | ICD-10-CM

## 2014-08-03 DIAGNOSIS — R569 Unspecified convulsions: Secondary | ICD-10-CM

## 2014-08-03 DIAGNOSIS — Q282 Arteriovenous malformation of cerebral vessels: Secondary | ICD-10-CM

## 2014-08-03 MED ORDER — IBUPROFEN 600 MG PO TABS: 600.0000 mg | ORAL_TABLET | Freq: Four times a day (QID) | ORAL | Status: DC | PRN

## 2014-08-03 NOTE — Patient Instructions (Addendum)
We have sent in the referral to [facility] and should be able to get you in there pretty quickly. In the meantime we have sent in a refill of the ibuprofen.   Things like caffeine can affect headaches and taking over the counter headache medicine can cause headaches to come back. It may be worthwhile to keep a headache journal before you go to [facility] so you can track patterns of when you get headaches and if anything seems to set them off.   Analgesic Rebound Headaches An analgesic rebound headache is a headache that returns after pain medicine (analgesic) that was taken to treat the initial headache wears off. People who suffer from tension, migraine, or cluster headaches are at risk for developing rebound headaches. Any type of primary headache can return as a rebound headache if you regularly take analgesics more than three times a week. If the cycle of rebound headaches continues, they become chronic daily headaches.  CAUSES Analgesics frequently associated with this problem include common over-the-counter medicines like aspirin, ibuprofen, acetaminophen, sinus relief medicines, and other medicines that contain caffeine. Narcotic pain medicines are also a common cause of rebound headaches.  SIGNS AND SYMPTOMS The symptoms of rebound headaches are the same as the symptoms of your initial headache. Symptoms of specific types of headaches include: Tension headache  Pressure around the head.  Dull, aching head pain.  Pain felt over the front and sides of the head.  Tenderness in the muscles of the head, neck and shoulders. Migraine Headache  Pulsing or throbbing pain on one or both sides of the head.  Severe pain that interferes with daily activities.  Pain that is worsened by physical activity.  Nausea, vomiting, or both.  Pain with exposure to bright light, loud noises, or strong smells.  General sensitivity to bright light, loud noises, or strong smells.  Visual  changes.  Numbness of one or both arms. Cluster Headaches  Severe pain that begins in or around one eye or temple.  Redness in the eye on the same side as the pain.  Droopy or swollen eyelid.  One-sided head pain.  Nausea.  Runny nose.  Sweaty, pale facial skin.  Restlessness. DIAGNOSIS  Analgesic rebound headaches are diagnosed by reviewing your medical history. This includes the nature of your initial headaches, as well as the type of pain medicines you have been using to treat your headaches and how often you take them. TREATMENT Discontinuing frequent use of the analgesic medicine will typically reduce the frequency of the rebound episodes. This may initially worsen your headaches but eventually the pain should become more manageable, less frequent, and less severe.  Seeing a headache specialists may helpful. He or she may be able to help you manage your headaches and to make sure there is not another cause of the headaches. Alternative methods of stress relief such as acupuncture, counseling, biofeedback, and massage may also be helpful. Talk with your health care provider about which alternative treatments might be good for you. HOME CARE INSTRUCTIONS Stopping the regular use of pain medicine can be difficult. Follow your health care provider's instructions carefully. Keep all of your appointments. Avoid triggers that are known to cause your primary headaches. SEEK MEDICAL CARE IF: You continue to experience headaches after following your health care provider's recommended treatments. SEEK IMMEDIATE MEDICAL CARE IF:  You develop new headache pain.  You develop headache pain that is different than what you have experienced in the past.  You develop numbness or  tingling in your arms or legs.  You develop changes in your speech or vision. MAKE SURE YOU:  Understand these instructions.  Will watch your child's condition.  Will get help right away if your child is not  doing well or gets worse. Document Released: 09/16/2003 Document Revised: 11/10/2013 Document Reviewed: 01/09/2013 [facility] Patient Information 2015 [place], [place]. This information is not intended to replace advice given to you by your health care provider. Make sure you discuss any questions you have with your health care provider.

## 2014-08-03 NOTE — Progress Notes (Signed)
Pre visit review using our clinic review tool, if applicable. No additional management support is needed unless otherwise documented below in the visit note. 

## 2014-08-03 NOTE — Assessment & Plan Note (Signed)
S/P gamma ray in 2014 and follows with [facility] and with surveillance MRI yearly for size and stability.

## 2014-08-03 NOTE — Assessment & Plan Note (Signed)
Continues to use flonase as needed and do not feel sinuses are cause of her headaches.

## 2014-08-03 NOTE — Progress Notes (Signed)
   Subjective:    Patient ID: Sherry Sheppard, female    DOB: 08/15/1985, 29 y.o.   MRN: [id number]  HPI The patient is a 29 Yo female who is coming in today for an annual exam. She has PMH of AVM treated with gamma ray back in 2014. This has done well and she had surveillance exam in December. She has started having more frequent headaches and her neurosurgeon wants her to see a neurologist. [facility] since [facility] has had a good experience there. She is able to take tylenol or ibuprofen over the counter to get rid of the headaches. She is trying to lose some weight right now and is dieting and exercising 3 times per week. She denies any recurrent seizures. No other new problems.   Review of Systems  Constitutional: Negative for fever, chills, activity change, appetite change, fatigue and unexpected weight change.  Respiratory: Negative for cough, chest tightness, shortness of breath and wheezing.   Cardiovascular: Negative for chest pain, palpitations and leg swelling.  Gastrointestinal: Negative for nausea, abdominal pain, diarrhea, constipation and abdominal distention.  Musculoskeletal: Negative for myalgias, arthralgias and gait problem.  Skin: Negative.   Neurological: Positive for headaches. Negative for dizziness, seizures, weakness, light-headedness and numbness.  Psychiatric/Behavioral: Negative.       Objective:   Physical Exam  Constitutional: She is oriented to person, place, and time. She appears well-developed and well-nourished.  HENT:  Head: Normocephalic and atraumatic.  Eyes: EOM are normal.  Neck: Normal range of motion.  Cardiovascular: Normal rate and regular rhythm.   No murmur heard. Pulmonary/Chest: Effort normal and breath sounds normal. No respiratory distress. She has no wheezes. She has no rales.  Abdominal: Soft. Bowel sounds are normal. She exhibits no distension. There is no tenderness. There is no rebound.  Musculoskeletal: She  exhibits no edema.  Neurological: She is alert and oriented to person, place, and time. Coordination normal.  Skin: Skin is warm and dry.   Filed Vitals:   08/03/14 0919  BP: 108/62  Pulse: 78  Temp: 98.4 F (36.9 C)  TempSrc: Oral  Resp: 16  Height: 5\' 4"  (1.626 m)  Weight: 151 lb 6.4 oz (68.675 kg)  SpO2: 98%      Assessment & Plan:

## 2014-08-03 NOTE — Assessment & Plan Note (Signed)
One episode while in a c-section, not recurrent yet. Not on medications.

## 2014-08-03 NOTE — Assessment & Plan Note (Signed)
Could be related to analgesic rebound and will have her keep headache journal. Refilled her ibuprofen. Referral placed to [facility] per patient request. Recent MRI December without changes and not likely related to her AVM.

## 2014-09-10 ENCOUNTER — Telehealth: Payer: 59 | Admitting: Physician Assistant

## 2014-09-10 DIAGNOSIS — B9789 Other viral agents as the cause of diseases classified elsewhere: Secondary | ICD-10-CM

## 2014-09-10 DIAGNOSIS — J329 Chronic sinusitis, unspecified: Secondary | ICD-10-CM

## 2014-09-10 MED ORDER — BENZONATATE 100 MG PO CAPS: 100.0000 mg | ORAL_CAPSULE | Freq: Three times a day (TID) | ORAL | Status: DC | PRN

## 2014-09-10 MED ORDER — FLUTICASONE PROPIONATE 50 MCG/ACT NA SUSP: 2.0000 | Freq: Every day | NASAL | Status: DC

## 2014-09-10 NOTE — Progress Notes (Signed)
We are sorry that you are not feeling well.  Here is how we plan to help!  Based on what you have shared with me it looks like you have sinusitis.  Sinusitis is inflammation and infection in the sinus cavities of the head.  Based on your presentation I believe you most likely have Acute Viral Sinusitis. This is an infection most likely caused by a virus.  There is not specific treatment for viral sinusitis other than to help you with the symptoms until the infection runs it's course.  You may use an oral decongestant such as Mucinex D or if you have glaucoma or high blood pressure use plain Mucinex.  Saline nasal spray help and can safely be used as often as needed for congestion, I have prescribed fluticason nasal spray. Spray two sprays in each nostril twice a day to help reduce your symptoms.  Some authorities believe that zinc sprays or the use of Echinacea may shorten the course of your symptoms. I have also prescribed Tessalon Perles for the cough  Sinus infections are not as easily transmitted as other respiratory infection, however we still recommend that you avoid close contact with loved ones, especially the very young and elderly.  Remember to wash your hands thoroughly throughout the day as this is the number one way to prevent the spread of infection!  Home Care:  Only take medications as instructed by your medical team.  Complete the entire course of an antibiotic.  Do not take these medications with alcohol.  A steam or ultrasonic humidifier can help congestion.  You can place a towel over your head and breathe in the steam from hot water coming from a faucet.  Avoid close contacts especially the very young and the elderly.  Cover your mouth when you cough or sneeze.  Always remember to wash your hands.  Get Help Right Away If:  You develop worsening fever or sinus pain.  You develop a severe head ache or visual changes.  Your symptoms persist after you have completed your  treatment plan.  Make sure you  Understand these instructions.  Will watch your condition.  Will get help right away if you are not doing well or get worse.  Your e-visit answers were reviewed by a board certified advanced clinical practitioner to complete your personal care plan.  Depending on the condition, your plan could have included both over the counter or prescription medications.  If there is a problem please reply  once you have received a response from your provider.  Your safety is important to us.  If you have drug allergies check your prescription carefully.    You can use MyChart to ask questions about today's visit, request a non-urgent call back, or ask for a work or school excuse.  You will get an e-mail in the next two days asking about your experience.  I hope that your e-visit has been valuable and will speed your recovery. Thank you for using e-visits.

## 2014-11-19 ENCOUNTER — Encounter: Payer: Self-pay | Admitting: Internal Medicine

## 2014-12-08 DIAGNOSIS — Z0279 Encounter for issue of other medical certificate: Secondary | ICD-10-CM

## 2014-12-30 ENCOUNTER — Encounter: Payer: Self-pay | Admitting: Family

## 2014-12-30 ENCOUNTER — Ambulatory Visit (INDEPENDENT_AMBULATORY_CARE_PROVIDER_SITE_OTHER): Payer: 59 | Admitting: Family

## 2014-12-30 VITALS — BP 110/80 | HR 67 | Temp 98.0°F | Resp 18 | Ht 64.0 in | Wt 147.0 lb

## 2014-12-30 DIAGNOSIS — R05 Cough: Secondary | ICD-10-CM

## 2014-12-30 DIAGNOSIS — R059 Cough, unspecified: Secondary | ICD-10-CM | POA: Insufficient documentation

## 2014-12-30 NOTE — Progress Notes (Signed)
Subjective:    Patient ID: Sherry Sheppard, female    DOB: 15-Sep-1985, 30 y.o.   MRN: [id number]  Chief Complaint  Patient presents with  . Cough    x2 days, sore throat, ears hurt, chest tightness, cough, body aches, and sinus pressure    HPI:  Sherry Sheppard is a 29 y.o. female with a PMH of seizures, atrial venous malformation, and seasonal allergies who presents today for an acute office visit.  This is a new problem. Associated symptoms of sore throat, ears hurting, chest tightness, cough, body aches, and sinus pressure have been going on for approximately 2 days. Modifying factors include OTC alkaseltzer cold/flu which did not provide any relief. Notes that her symptoms have progressively worsened over the course of the past 2 days. Her son has been sick as well. Timing of symptoms has been worse at night. Denies any recent antibiotic use.    Allergies  Allergen Reactions  . Banana Shortness Of Breath    Current Outpatient Prescriptions on File Prior to Visit  Medication Sig Dispense Refill  . benzonatate (TESSALON) 100 MG capsule Take 1 capsule (100 mg total) by mouth 3 (three) times daily as needed for cough. 30 capsule 0  . fluticasone (FLONASE) 50 MCG/ACT nasal spray Place 2 sprays into both nostrils daily. 16 g 6  . ibuprofen (ADVIL,MOTRIN) 600 MG tablet Take 1 tablet (600 mg total) by mouth every 6 (six) hours as needed. 60 tablet 2  . Iron-FA-B Cmp-C-Biot-Probiotic (FUSION PLUS) CAPS Take 1 capsule by mouth daily.    Marland Kitchen levonorgestrel (MIRENA) 20 MCG/24HR IUD 1 Intra Uterine Device (1 each total) by Intrauterine route once. 1 each 0  . Prenat-FeFum-FePo-FA-Omega 3 (TARON-C DHA PO) Take 1 tablet by mouth daily.     No current facility-administered medications on file prior to visit.     Review of Systems  Constitutional: Negative for fever and chills.  HENT: Positive for congestion, ear pain, sneezing and sore throat.   Respiratory: Positive for cough.      Objective:    BP 110/80 mmHg  Pulse 67  Temp(Src) 98 F (36.7 C) (Oral)  Resp 18  Ht 5\' 4"  (1.626 m)  Wt 147 lb (66.679 kg)  BMI 25.22 kg/m2  SpO2 97% Nursing note and vital signs reviewed.  Physical Exam  Constitutional: She is oriented to person, place, and time. She appears well-developed and well-nourished. No distress.  HENT:  Right Ear: Hearing, tympanic membrane, external ear and ear canal normal.  Left Ear: Hearing, tympanic membrane, external ear and ear canal normal.  Nose: Right sinus exhibits maxillary sinus tenderness. Right sinus exhibits no frontal sinus tenderness. Left sinus exhibits maxillary sinus tenderness. Left sinus exhibits no frontal sinus tenderness.  Mouth/Throat: Uvula is midline, oropharynx is clear and moist and mucous membranes are normal.  Cardiovascular: Normal rate, regular rhythm, normal heart sounds and intact distal pulses.   Pulmonary/Chest: Effort normal and breath sounds normal.  Neurological: She is alert and oriented to person, place, and time.  Skin: Skin is warm and dry.  Psychiatric: She has a normal mood and affect. Her behavior is normal. Judgment and thought content normal.       Assessment & Plan:   Problem List Items Addressed This Visit      Other   Cough - Primary    Symptoms and exam consistent with viral sinusitis, however cannot rule out bacterial sinusitis. Treat conservatively at this time with over-the-counter medications as needed for  symptom relief and supportive care. If symptoms continue to worsen in the next 2-3 days, patient will call office and will start antibiotic.

## 2014-12-30 NOTE — Patient Instructions (Signed)
Thank you for choosing [facility].  Summary/Instructions:  If your symptoms worsen or fail to improve, please contact our office for further instruction, or in case of emergency go directly to the emergency room at the closest medical facility.   General Recommendations:    Please drink plenty of fluids.  Get plenty of rest   Sleep in humidified air  Use saline nasal sprays  Netti pot   OTC Medications:  Decongestants - helps relieve congestion   Flonase (generic fluticasone) or Nasacort (generic triamcinolone) - please make sure to use the "cross-over" technique at a 45 degree angle towards the opposite eye as opposed to straight up the nasal passageway.   Sudafed (generic pseudoephedrine - Note this is the one that is available behind the pharmacy counter); Products with phenylephrine (-PE) may also be used but is often not as effective as pseudoephedrine.   If you have HIGH BLOOD PRESSURE - Coricidin HBP; AVOID any product that is -D as this contains pseudoephedrine which may increase your blood pressure.  Afrin (oxymetazoline) every 6-8 hours for up to 3 days.   Allergies - helps relieve runny nose, itchy eyes and sneezing   Claritin (generic loratidine), Allegra (fexofenidine), or Zyrtec (generic cyrterizine) for runny nose. These medications should not cause drowsiness.  Note - Benadryl (generic diphenhydramine) may be used however may cause drowsiness  Cough -   Delsym or Robitussin (generic dextromethorphan)  Expectorants - helps loosen mucus to ease removal   Mucinex (generic guaifenesin) as directed on the package.  Headaches / General Aches   Tylenol (generic acetaminophen) - DO NOT EXCEED 3 grams (3,000 mg) in a 24 hour time period  Advil/Motrin (generic ibuprofen)   Sore Throat -   Salt water gargle   Chloraseptic (generic benzocaine) spray or lozenges / Sucrets (generic dyclonine)       

## 2014-12-30 NOTE — Progress Notes (Signed)
Pre visit review using our clinic review tool, if applicable. No additional management support is needed unless otherwise documented below in the visit note. 

## 2014-12-30 NOTE — Assessment & Plan Note (Signed)
Symptoms and exam consistent with viral sinusitis, however cannot rule out bacterial sinusitis. Treat conservatively at this time with over-the-counter medications as needed for symptom relief and supportive care. If symptoms continue to worsen in the next 2-3 days, patient will call office and will start antibiotic.

## 2015-01-21 ENCOUNTER — Ambulatory Visit (INDEPENDENT_AMBULATORY_CARE_PROVIDER_SITE_OTHER): Payer: 59 | Admitting: Obstetrics

## 2015-01-21 ENCOUNTER — Encounter: Payer: Self-pay | Admitting: Obstetrics

## 2015-01-21 VITALS — BP 112/80 | HR 85 | Temp 99.2°F | Wt 148.8 lb

## 2015-01-21 DIAGNOSIS — Z01419 Encounter for gynecological examination (general) (routine) without abnormal findings: Secondary | ICD-10-CM

## 2015-01-21 DIAGNOSIS — Q282 Arteriovenous malformation of cerebral vessels: Secondary | ICD-10-CM

## 2015-01-21 DIAGNOSIS — Z124 Encounter for screening for malignant neoplasm of cervix: Secondary | ICD-10-CM

## 2015-01-21 DIAGNOSIS — Q283 Other malformations of cerebral vessels: Secondary | ICD-10-CM

## 2015-01-21 DIAGNOSIS — G43101 Migraine with aura, not intractable, with status migrainosus: Secondary | ICD-10-CM | POA: Diagnosis not present

## 2015-01-21 DIAGNOSIS — Z30430 Encounter for insertion of intrauterine contraceptive device: Secondary | ICD-10-CM

## 2015-01-21 NOTE — Addendum Note (Signed)
Addended by: Marya LandryFOSTER, Dezyre Hoefer D on: 01/21/2015 04:46 PM   Modules accepted: Orders

## 2015-01-21 NOTE — Progress Notes (Signed)
Subjective:        Sherry Sheppard is a 29 y.o. female here for a routine exam.  Current complaints: None.    Personal health questionnaire:  Is patient Sherry Sheppard, have a family history of breast and/or ovarian cancer: no Is there a family history of uterine cancer diagnosed at age < 57, gastrointestinal cancer, urinary tract cancer, family member who is a Personnel officer syndrome-associated carrier: no Is the patient overweight and hypertensive, family history of diabetes, personal history of gestational diabetes, preeclampsia or PCOS: no Is patient over 77, have PCOS,  family history of premature CHD under age 37, diabetes, smoke, have hypertension or peripheral artery disease:  no At any time, has a partner hit, kicked or otherwise hurt or frightened you?: no Over the past 2 weeks, have you felt down, depressed or hopeless?: no Over the past 2 weeks, have you felt little interest or pleasure in doing things?:no   Gynecologic History No LMP recorded. Patient is not currently having periods (Reason: IUD). Contraception: IUD Last Pap: 2015. Results were: normal Last mammogram: n/a. Results were: n/a  Obstetric History OB History  Gravida Para Term Preterm AB SAB TAB Ectopic Multiple Living  # Outcome Date GA Lbr Len/2nd Weight Sex Delivery Anes PTL Lv  3 Term 08/24/12 [redacted]w[redacted]d  8 lb 0.8 oz (3.65 kg) M CS-Vac Spinal  Y  2 SAB 2013             Comments: System Generated. Please review and update pregnancy details.  1 Term 04/07/08    [name]   Y     Comments: c/S because FTP and fetal distress      Past Medical History  Diagnosis Date  . Allergic rhinitis   . Seizures     last event 08/24/12  . Migraines     Past Surgical History  Procedure Laterality Date  . Cesarean section  2009  . Tooth extraction  2012  . Dilation and evacuation  09/20/2011    Procedure: DILATATION AND EVACUATION;  Surgeon: Brock Bad, MD;  Location: [facility];  Service:  Gynecology;  Laterality: N/A;  . Cesarean section N/A 08/24/2012    Procedure: CESAREAN SECTION;  Surgeon: Kathreen Cosier, MD;  Location: [facility];  Service: Obstetrics;  Laterality: N/A;  . 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  benzonatate (TESSALON) 100 MG capsule, Take 1 capsule (100 mg total) by mouth 3 (three) times daily as needed for cough., Disp: 30 capsule, Rfl: 0 .  fluticasone (FLONASE) 50 MCG/ACT nasal spray, Place 2 sprays into both nostrils daily., Disp: 16 g, Rfl: 6 .  ibuprofen (ADVIL,MOTRIN) 600 MG tablet, Take 1 tablet (600 mg total) by mouth every 6 (six) hours as needed., Disp: 60 tablet, Rfl: 2 .  Iron-FA-B Cmp-C-Biot-Probiotic (FUSION PLUS) CAPS, Take 1 capsule by mouth daily., Disp: , Rfl:  .  levonorgestrel (MIRENA) 20 MCG/24HR IUD, 1 Intra Uterine Device (1 each total) by Intrauterine route once., Disp: 1 each, Rfl: 0 .  naproxen (NAPROSYN) 250 MG tablet, Take by mouth 2 (two) times daily with a meal., Disp: , Rfl:  .  ondansetron (ZOFRAN) 4 MG tablet, Take 4 mg by mouth every 8 (eight) hours as needed for nausea or vomiting., Disp: , Rfl:  .  Prenat-FeFum-FePo-FA-Omega 3 (TARON-C DHA PO), Take  1 tablet by mouth daily., Disp: , Rfl:  .  topiramate (TOPAMAX) 25 MG tablet, Take 50 mg by mouth daily. , Disp: , Rfl:  Allergies  Allergen Reactions  . Banana Shortness Of Breath    History  Substance Use Topics  . Smoking status: Never Smoker   . Smokeless tobacco: Never Used  . Alcohol Use: No    Family History  Problem Relation Age of Onset  . Hyperlipidemia Mother   . Lung cancer Maternal Grandmother [id number]  . Hypertension Maternal Aunt   . Diabetes Cousin       Review of Systems  Constitutional: negative for fatigue and weight loss Respiratory: negative for cough and wheezing Cardiovascular: negative for chest pain, fatigue and palpitations Gastrointestinal: negative for abdominal pain and change  in bowel habits Musculoskeletal:negative for myalgias Neurological: negative for gait problems and tremors Behavioral/Psych: negative for abusive relationship, depression Endocrine: negative for temperature intolerance   Genitourinary:negative for abnormal menstrual periods, genital lesions, hot flashes, sexual problems and vaginal discharge Integument/breast: negative for breast lump, breast tenderness, nipple discharge and skin lesion(s)    Objective:       BP 112/80 mmHg  Pulse 85  Temp(Src) 99.2 F (37.3 C)  Wt 148 lb 12.8 oz (67.495 kg) General:   alert  Skin:   no rash or abnormalities  Lungs:   clear to auscultation bilaterally  Heart:   regular rate and rhythm, S1, S2 normal, no murmur, click, rub or gallop  Breasts:   normal without suspicious masses, skin or nipple changes or axillary nodes  Abdomen:  normal findings: no organomegaly, soft, non-tender and no hernia  Pelvis:  External genitalia: normal general appearance Urinary system: urethral meatus normal and bladder without fullness, nontender Vaginal: normal without tenderness, induration or masses Cervix: normal appearance.  IUD string visible and normal length. Adnexa: normal bimanual exam Uterus: anteverted and non-tender, normal size   Lab Review Urine pregnancy test Labs reviewed yes Radiologic studies reviewed yes    Assessment:    Healthy female exam.    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  Stable   Plan:   Continue IUD.  Education reviewed: calcium supplements, low fat, low cholesterol diet, self breast exams and weight bearing exercise. Contraception: IUD. Follow up in: 1 year.   Meds ordered this encounter  Medications  . ondansetron (ZOFRAN) 4 MG tablet    Sig: Take 4 mg by mouth every 8 (eight) hours as needed for nausea or vomiting.  . naproxen (NAPROSYN) 250 MG tablet    Sig: Take by mouth 2 (two) times daily with a meal.

## 2015-01-22 LAB — CBC WITH DIFFERENTIAL/PLATELET
Basophils Absolute: 0 10*3/uL (ref 0.0–0.1)
Basophils Relative: 0 % (ref 0–1)
EOS ABS: 0.2 10*3/uL (ref 0.0–0.7)
Eosinophils Relative: 2 % (ref 0–5)
HCT: 37.5 % (ref 36.0–46.0)
Hemoglobin: 11.9 g/dL — ABNORMAL LOW (ref 12.0–15.0)
LYMPHS ABS: 3.1 10*3/uL (ref 0.7–4.0)
Lymphocytes Relative: 37 % (ref 12–46)
MCH: 27 pg (ref 26.0–34.0)
MCHC: 31.7 g/dL (ref 30.0–36.0)
MCV: 85.2 fL (ref 78.0–100.0)
MONO ABS: 0.2 10*3/uL (ref 0.1–1.0)
MPV: 9.4 fL (ref 8.6–12.4)
Monocytes Relative: 3 % (ref 3–12)
NEUTROS ABS: 4.8 10*3/uL (ref 1.7–7.7)
Neutrophils Relative %: 58 % (ref 43–77)
Platelets: 346 10*3/uL (ref 150–400)
RBC: 4.4 MIL/uL (ref 3.87–5.11)
RDW: 13.4 % (ref 11.5–15.5)
WBC: 8.3 10*3/uL (ref 4.0–10.5)

## 2015-01-22 LAB — COMPREHENSIVE METABOLIC PANEL
ALBUMIN: 4.1 g/dL (ref 3.5–5.2)
ALT: <8 U/L (ref 0–35)
AST: 13 U/L (ref 0–37)
Alkaline Phosphatase: 76 U/L (ref 39–117)
BUN: 9 mg/dL (ref 6–23)
CO2: 22 meq/L (ref 19–32)
CREATININE: 0.71 mg/dL (ref 0.50–1.10)
Calcium: 8.9 mg/dL (ref 8.4–10.5)
Chloride: 105 mEq/L (ref 96–112)
Glucose, Bld: 70 mg/dL (ref 70–99)
POTASSIUM: 4.1 meq/L (ref 3.5–5.3)
Sodium: 139 mEq/L (ref 135–145)
TOTAL PROTEIN: 7.4 g/dL (ref 6.0–8.3)
Total Bilirubin: 0.4 mg/dL (ref 0.2–1.2)

## 2015-01-22 LAB — TRIGLYCERIDES: Triglycerides: 40 mg/dL (ref <150)

## 2015-01-22 LAB — PAP IG W/ RFLX HPV ASCU

## 2015-01-22 LAB — HIV ANTIBODY (ROUTINE TESTING W REFLEX): HIV: NONREACTIVE

## 2015-01-22 LAB — RPR

## 2015-01-22 LAB — HEPATITIS B SURFACE ANTIGEN: HEP B S AG: NEGATIVE

## 2015-01-22 LAB — CHOLESTEROL, TOTAL: CHOLESTEROL: 137 mg/dL (ref 0–200)

## 2015-01-22 LAB — HDL CHOLESTEROL: HDL: 56 mg/dL (ref >=46)

## 2015-01-22 LAB — HEPATITIS C ANTIBODY: HCV Ab: NEGATIVE

## 2015-01-22 LAB — TSH: TSH: 0.562 u[IU]/mL (ref 0.350–4.500)

## 2015-01-25 LAB — SURESWAB, VAGINOSIS/VAGINITIS PLUS
ATOPOBIUM VAGINAE: NOT DETECTED Log (cells/mL)
C. PARAPSILOSIS, DNA: NOT DETECTED
C. TRACHOMATIS RNA, TMA: NOT DETECTED
C. albicans, DNA: NOT DETECTED
C. glabrata, DNA: NOT DETECTED
C. tropicalis, DNA: NOT DETECTED
GARDNERELLA VAGINALIS: NOT DETECTED Log (cells/mL)
LACTOBACILLUS SPECIES: 7.6 Log (cells/mL)
MEGASPHAERA SPECIES: NOT DETECTED Log (cells/mL)
N. gonorrhoeae RNA, TMA: NOT DETECTED
T. VAGINALIS RNA, QL TMA: NOT DETECTED

## 2015-04-02 ENCOUNTER — Ambulatory Visit: Payer: 59

## 2015-07-23 ENCOUNTER — Encounter: Payer: Self-pay | Admitting: Skilled Nursing Facility1

## 2015-07-23 ENCOUNTER — Encounter: Payer: 59 | Attending: Internal Medicine | Admitting: Skilled Nursing Facility1

## 2015-07-23 VITALS — Ht 65.0 in | Wt 145.0 lb

## 2015-07-23 DIAGNOSIS — Z713 Dietary counseling and surveillance: Secondary | ICD-10-CM | POA: Insufficient documentation

## 2015-07-23 DIAGNOSIS — E639 Nutritional deficiency, unspecified: Secondary | ICD-10-CM | POA: Insufficient documentation

## 2015-07-23 NOTE — Progress Notes (Signed)
  Medical Nutrition Therapy:  Appt start time: 0900 end time:  1000.   Assessment:  Primary concerns today: [place] emplyee. Pt states she would like knowledge about nutrition. Pt states she has dieted: 180 during pregnancy then lost 40 pounds: cut calories, exercised, cut out soda, cut out fried foods, cut out sweets, worked out; done all at once over [employer]-got discouraged could not lose anymore so then stated to add those foods back in but did not gain wt. Pt states she wants to get down to 130 pounds. Pt states she spends her days Back and forth to work/playing with kids. Pt states she does not sleep well due to 31 year old kid disturbances.  Preferred Learning Style:   No preference indicated   Learning Readiness:   Ready  MEDICATIONS: Topamax   DIETARY INTAKE:  Usual eating pattern includes 3 meals and 0 snacks per day.  Everyday foods include fast food.  Avoided foods include none stated.    24-hr recall:  B ( AM): fast food----grits----scrambled egg Snk ( AM): none L ( PM): fast food----sandwhich Snk ( PM): none D ( PM): fish, broccoli----homemade pizza Snk ( PM): none Beverages: juice, soda, water, sweet tea, herbal tea  *Meals outside the home: several week  Usual physical activity: ADL's  Estimated energy needs: 1600 calories 180 g carbohydrates 120 g protein  44 g fat  Progress Towards Goal(s):  In progress.   Nutritional Diagnosis:  NB-1.1 Food and nutrition-related knowledge deficit As related to no prior nutrition education from a nutrition professional.  As evidenced by overconsumption of fast food and pt report.    Intervention:  Nutrition counseling for healthy habits. Dietitian educated the pt on balanced meals, water consumption, eating throughout the day,and the importance of physical activity. Goals: -Keep a bottle of water by your computer to remind you to take swigs every so often -Try to aim for 6-8 hours of sleep a  night -ReligionU.com.cyhttp://www.ellynsatterinstitute.org/other/ellynsatter.php   For sleeping tips for toddler -Try to avoid eating fast food often -Plan your meals and snacks for the week -Change up your grocery shopping -Change up your meals and snacks -A meal: carbohydrate, protein, vegetables: more vegetables than the other 2 -Try to cut your soda down by one every day -Accomplish changes in phases -Look into classes offered through [facility] -Aim to be physically active 2 days a week for 30 minutes; walk with the kids (the track or the park) or do a tape-start your own zumba class  Teaching Method Utilized:  Visual Auditory  Handouts given during visit include:  Snack sheet  Barriers to learning/adherence to lifestyle change: children/perceived time  Demonstrated degree of understanding via:  Teach Back   Monitoring/Evaluation:  Dietary intake, exercise, and body weight prn.

## 2015-07-23 NOTE — Patient Instructions (Signed)
-  Keep a bottle of water by your computer to remind you to take swigs every so often -Try to aim for 6-8 hours of sleep a night -ReligionU.com.cyhttp://www.ellynsatterinstitute.org/other/ellynsatter.php   For sleeping tips for toddler -Try to avoid eating fast food often -Plan your meals and snacks for the week -Change up your grocery shopping -Change up your meals and snacks -A meal: carbohydrate, protein, vegetables: more vegetables than the other 2 -Try to cut your soda down by one every day -Accomplish changes in phases -Look into classes offered through [facility] -Aim to be physically active 2 days a week for 30 minutes; walk with the kids (the track or the park) or do a tape-start your own zumba class

## 2015-07-26 MED FILL — NAPROXEN SODIUM 550 MG TAB: 550 | 35 days supply | Qty: 20 | Fill #1

## 2015-07-26 MED FILL — TOPIRAMATE 25 MG TABLET: 25 | 30 days supply | Qty: 90 | Fill #2

## 2015-08-05 ENCOUNTER — Encounter: Payer: 59 | Admitting: Internal Medicine

## 2015-08-05 ENCOUNTER — Other Ambulatory Visit (INDEPENDENT_AMBULATORY_CARE_PROVIDER_SITE_OTHER): Payer: 59

## 2015-08-05 ENCOUNTER — Encounter: Payer: Self-pay | Admitting: Internal Medicine

## 2015-08-05 ENCOUNTER — Ambulatory Visit (INDEPENDENT_AMBULATORY_CARE_PROVIDER_SITE_OTHER): Payer: 59 | Admitting: Internal Medicine

## 2015-08-05 VITALS — BP 110/72 | HR 92 | Temp 98.6°F | Resp 12 | Ht 65.0 in | Wt 146.0 lb

## 2015-08-05 DIAGNOSIS — Q2730 Arteriovenous malformation, site unspecified: Secondary | ICD-10-CM | POA: Diagnosis not present

## 2015-08-05 DIAGNOSIS — Z0000 Encounter for general adult medical examination without abnormal findings: Secondary | ICD-10-CM | POA: Diagnosis not present

## 2015-08-05 DIAGNOSIS — G43009 Migraine without aura, not intractable, without status migrainosus: Secondary | ICD-10-CM | POA: Diagnosis not present

## 2015-08-05 LAB — COMPREHENSIVE METABOLIC PANEL
ALT: 6 U/L (ref 0–35)
AST: 12 U/L (ref 0–37)
Albumin: 4.3 g/dL (ref 3.5–5.2)
Alkaline Phosphatase: 72 U/L (ref 39–117)
BILIRUBIN TOTAL: 0.5 mg/dL (ref 0.2–1.2)
BUN: 11 mg/dL (ref 6–23)
CHLORIDE: 107 meq/L (ref 96–112)
CO2: 25 mEq/L (ref 19–32)
Calcium: 9.1 mg/dL (ref 8.4–10.5)
Creatinine, Ser: 0.79 mg/dL (ref 0.40–1.20)
GFR: 110.99 mL/min (ref >60.00)
GLUCOSE: 76 mg/dL (ref 70–99)
Potassium: 3.6 mEq/L (ref 3.5–5.1)
Sodium: 138 mEq/L (ref 135–145)
Total Protein: 7.7 g/dL (ref 6.0–8.3)

## 2015-08-05 LAB — CBC
HEMATOCRIT: 37.3 % (ref 36.0–46.0)
HEMOGLOBIN: 12.2 g/dL (ref 12.0–15.0)
MCHC: 32.6 g/dL (ref 30.0–36.0)
MCV: 84.3 fl (ref 78.0–100.0)
PLATELETS: 327 10*3/uL (ref 150.0–400.0)
RBC: 4.43 Mil/uL (ref 3.87–5.11)
RDW: 13.3 % (ref 11.5–15.5)
WBC: 9.8 10*3/uL (ref 4.0–10.5)

## 2015-08-05 LAB — LIPID PANEL
CHOL/HDL RATIO: 3
Cholesterol: 138 mg/dL (ref 0–200)
HDL: 51.4 mg/dL (ref >39.00)
LDL Cholesterol: 77 mg/dL (ref 0–99)
NONHDL: 86.19
TRIGLYCERIDES: 44 mg/dL (ref 0.0–149.0)
VLDL: 8.8 mg/dL (ref 0.0–40.0)

## 2015-08-05 NOTE — Assessment & Plan Note (Signed)
Checking screening labs today, needs more exercise. Non-smoker and no screening additional recommended. Updated her history today.

## 2015-08-05 NOTE — Progress Notes (Signed)
Pre visit review using our clinic review tool, if applicable. No additional management support is needed unless otherwise documented below in the visit note. 

## 2015-08-05 NOTE — Patient Instructions (Signed)
We are checking the labs today and will send them to the neurologist.   Keep up the good work with the health and work on starting some family exercise activities on the weekend to keep everybody moving.

## 2015-08-05 NOTE — Progress Notes (Signed)
   Subjective:    Patient ID: Sherry Sheppard, female    DOB: August 29, 1985, 30 y.o.   MRN: [id number]  HPI The patient is a 30 YO female coming in for wellness. No new concerns. Still seeing neurology for her headaches and AVM which is shrinking. Taking topamax for headaches and needs labs today.  PMH, [facility], social history reviewed and updated.   Review of Systems  Constitutional: Negative for fever, chills, activity change, appetite change, fatigue and unexpected weight change.  Respiratory: Negative for cough, chest tightness, shortness of breath and wheezing.   Cardiovascular: Negative for chest pain, palpitations and leg swelling.  Gastrointestinal: Negative for nausea, abdominal pain, diarrhea, constipation and abdominal distention.  Musculoskeletal: Negative for myalgias, arthralgias and gait problem.  Skin: Negative.   Neurological: Positive for headaches. Negative for dizziness, seizures, weakness, light-headedness and numbness.  Psychiatric/Behavioral: Negative.       Objective:   Physical Exam  Constitutional: She is oriented to person, place, and time. She appears well-developed and well-nourished.  HENT:  Head: Normocephalic and atraumatic.  Eyes: EOM are normal.  Neck: Normal range of motion.  Cardiovascular: Normal rate and regular rhythm.   No murmur heard. Pulmonary/Chest: Effort normal and breath sounds normal. No respiratory distress. She has no wheezes. She has no rales.  Abdominal: Soft. Bowel sounds are normal. She exhibits no distension. There is no tenderness. There is no rebound.  Musculoskeletal: She exhibits no edema.  Neurological: She is alert and oriented to person, place, and time. Coordination normal.  Skin: Skin is warm and dry.   Filed Vitals:   08/05/15 1502  BP: 110/72  Pulse: 92  Temp: 98.6 F (37 C)  TempSrc: Oral  Resp: 12  Height:  (1.651 m)  Weight: 146 lb (66.225 kg)  SpO2: 98%      Assessment & Plan:

## 2015-08-23 MED FILL — CAMBIA 50 MG POWDER PACKET: 50 | 10 days supply | Qty: 9 | Fill #1

## 2015-08-23 MED FILL — TOPIRAMATE 25 MG TABLET: 25 | 30 days supply | Qty: 90 | Fill #3

## 2015-09-06 MED FILL — FINACEA 15% GEL: 15 | 30 days supply | Qty: 50 | Fill #0

## 2015-09-06 MED FILL — HYDROQUINONE 4% CREAM: 4 | 28 days supply | Qty: 28 | Fill #0

## 2015-09-24 MED FILL — TOPIRAMATE 25 MG TABLET: 25 | 30 days supply | Qty: 90 | Fill #0

## 2015-09-27 MED FILL — TRETINOIN 0.025% CREAM: 0.025 | 10 days supply | Qty: 20 | Fill #0

## 2015-10-22 MED FILL — TOPIRAMATE 25 MG TABLET: 25 | 30 days supply | Qty: 90 | Fill #1

## 2015-11-24 MED FILL — TOPIRAMATE 25 MG TABLET: 25 | 30 days supply | Qty: 90 | Fill #2

## 2015-12-17 ENCOUNTER — Telehealth: Payer: Self-pay | Admitting: Obstetrics and Gynecology

## 2015-12-17 ENCOUNTER — Telehealth: Payer: Self-pay

## 2015-12-17 ENCOUNTER — Other Ambulatory Visit: Payer: Self-pay | Admitting: Obstetrics and Gynecology

## 2015-12-17 MED ORDER — TERCONAZOLE 0.4 % VA CREA: 1.0000 | TOPICAL_CREAM | Freq: Every day | VAGINAL | Status: DC

## 2015-12-17 NOTE — Telephone Encounter (Signed)
Pt would like a Diflucan called into [facility] for yeast infection.

## 2015-12-17 NOTE — Telephone Encounter (Signed)
done

## 2015-12-17 NOTE — Telephone Encounter (Signed)
Patient has a yeast infection and would like something sent in to the [facility] pharmacy.

## 2015-12-23 MED FILL — NAPROXEN SODIUM 550 MG TAB: 550 | 35 days supply | Qty: 20 | Fill #0

## 2015-12-23 MED FILL — TOPIRAMATE 25 MG TABLET: 25 | 30 days supply | Qty: 90 | Fill #3

## 2016-01-10 ENCOUNTER — Ambulatory Visit (INDEPENDENT_AMBULATORY_CARE_PROVIDER_SITE_OTHER): Payer: 59 | Admitting: Internal Medicine

## 2016-01-10 ENCOUNTER — Encounter: Payer: Self-pay | Admitting: Internal Medicine

## 2016-01-10 VITALS — BP 110/76 | HR 79 | Temp 98.8°F | Resp 14 | Ht 65.0 in | Wt 140.8 lb

## 2016-01-10 DIAGNOSIS — R059 Cough, unspecified: Secondary | ICD-10-CM

## 2016-01-10 DIAGNOSIS — R05 Cough: Secondary | ICD-10-CM | POA: Diagnosis not present

## 2016-01-10 MED ORDER — PREDNISONE 20 MG PO TABS: 40.0000 mg | ORAL_TABLET | Freq: Every day | ORAL | Status: DC

## 2016-01-10 MED ORDER — HYDROCODONE-HOMATROPINE 5-1.5 MG/5ML PO SYRP: 5.0000 mL | ORAL_SOLUTION | Freq: Three times a day (TID) | ORAL | Status: DC | PRN

## 2016-01-10 MED ORDER — ALBUTEROL SULFATE HFA 108 (90 BASE) MCG/ACT IN AERS: 2.0000 | INHALATION_SPRAY | Freq: Four times a day (QID) | RESPIRATORY_TRACT | Status: DC | PRN

## 2016-01-10 MED FILL — predniSONE 20 MG TABS: 20 | 5 days supply | Qty: 10 | Fill #0

## 2016-01-10 MED FILL — VENTOLIN HFA 90 MCG INHALER: 108 (90 BAS | 25 days supply | Qty: 18 | Fill #0

## 2016-01-10 MED FILL — HYDROCODONE-HOMATROPINE SYR: 5-1.5 | 8 days supply | Qty: 120 | Fill #0

## 2016-01-10 NOTE — Patient Instructions (Signed)
We have sent in the albuterol inhaler for the breathing which you can use as needed.   We have sent in prednisone to help clear the lungs and sinuses. Take 2 pills a day until gone (5 days).   We have given you a prescription for the cough syrup which you can use at night time for the coughing.

## 2016-01-10 NOTE — Progress Notes (Signed)
   Subjective:    Patient ID: Sherry Sheppard, female    DOB: 12/29/1985, 30 y.o.   MRN: [id number]  HPI The patient is a 30 YO female coming in for cough and cold symptoms. She started having some tightness with breathing about 1 week ago. She also had some body aches which are gradually improving. Her kids have been sick as well recently in the last several weeks with viral illnesses. She is still coughing and having some SOB at night time from coughing and congestion. She is taking flonase and has tried some OTC cough syrup which did not help. No fevers or chills. Not coughing anything up.   Review of Systems  Constitutional: Positive for fatigue. Negative for fever, chills, activity change, appetite change and unexpected weight change.  HENT: Positive for congestion, postnasal drip, rhinorrhea and sore throat. Negative for ear discharge, ear pain, nosebleeds, sinus pressure and trouble swallowing.   Eyes: Negative.   Respiratory: Positive for chest tightness and shortness of breath. Negative for cough and wheezing.   Cardiovascular: Negative for chest pain, palpitations and leg swelling.  Gastrointestinal: Negative for nausea, abdominal pain, diarrhea, constipation and abdominal distention.  Musculoskeletal: Positive for myalgias. Negative for arthralgias and gait problem.  Skin: Negative.   Neurological: Positive for headaches. Negative for dizziness, seizures, weakness, light-headedness and numbness.      Objective:   Physical Exam  Constitutional: She is oriented to person, place, and time. She appears well-developed and well-nourished.  HENT:  Head: Normocephalic and atraumatic.  Right Ear: External ear normal.  Left Ear: External ear normal.  Oropharynx with mild erythema and clear drainage.   Eyes: EOM are normal.  Neck: Normal range of motion. No JVD present.  Cardiovascular: Normal rate and regular rhythm.   No murmur heard. Pulmonary/Chest: Effort normal. No respiratory  distress. She has wheezes. She has no rales.  Trace wheezing which clears with cough.   Abdominal: Soft. Bowel sounds are normal. She exhibits no distension. There is no tenderness. There is no rebound.  Lymphadenopathy:    She has no cervical adenopathy.  Neurological: She is alert and oriented to person, place, and time. Coordination normal.   Filed Vitals:   01/10/16 1032  BP: 110/76  Pulse: 79  Temp: 98.8 F (37.1 C)  TempSrc: Oral  Resp: 14  Height: 5\' 5"  (1.651 m)  Weight: 140 lb 12.8 oz (63.866 kg)  SpO2: 97%      Assessment & Plan:

## 2016-01-10 NOTE — Progress Notes (Signed)
Pre visit review using our clinic review tool, if applicable. No additional management support is needed unless otherwise documented below in the visit note. 

## 2016-01-10 NOTE — Assessment & Plan Note (Signed)
Appears to be viral URI, rx for prednisone and albuterol for SOB and symptoms. Continue flonase. Hycodan for the cough at night time only. No indication for antibiotics today.

## 2016-01-24 MED FILL — TOPIRAMATE 25 MG TABLET: 25 | 30 days supply | Qty: 90 | Fill #4

## 2016-01-27 ENCOUNTER — Encounter: Payer: Self-pay | Admitting: Obstetrics

## 2016-01-27 ENCOUNTER — Ambulatory Visit (INDEPENDENT_AMBULATORY_CARE_PROVIDER_SITE_OTHER): Payer: 59 | Admitting: Obstetrics

## 2016-01-27 VITALS — BP 99/67 | HR 90 | Temp 99.0°F | Wt 143.2 lb

## 2016-01-27 DIAGNOSIS — Z113 Encounter for screening for infections with a predominantly sexual mode of transmission: Secondary | ICD-10-CM

## 2016-01-27 DIAGNOSIS — Z1389 Encounter for screening for other disorder: Secondary | ICD-10-CM

## 2016-01-27 DIAGNOSIS — Z01419 Encounter for gynecological examination (general) (routine) without abnormal findings: Secondary | ICD-10-CM | POA: Diagnosis not present

## 2016-01-27 DIAGNOSIS — Q282 Arteriovenous malformation of cerebral vessels: Secondary | ICD-10-CM

## 2016-01-27 DIAGNOSIS — Z124 Encounter for screening for malignant neoplasm of cervix: Secondary | ICD-10-CM | POA: Diagnosis not present

## 2016-01-27 DIAGNOSIS — G43101 Migraine with aura, not intractable, with status migrainosus: Secondary | ICD-10-CM

## 2016-01-27 DIAGNOSIS — Z0000 Encounter for general adult medical examination without abnormal findings: Secondary | ICD-10-CM

## 2016-01-27 LAB — POCT URINALYSIS DIPSTICK
BILIRUBIN UA: NEGATIVE
Blood, UA: 250
KETONES UA: NEGATIVE
Leukocytes, UA: NEGATIVE
Nitrite, UA: NEGATIVE
PROTEIN UA: NEGATIVE
SPEC GRAV UA: 1.01
Urobilinogen, UA: NEGATIVE
pH, UA: 7

## 2016-01-27 MED ORDER — PNV PRENATAL PLUS MULTIVITAMIN 27-1 MG PO TABS: 1.0000 | ORAL_TABLET | Freq: Every day | ORAL | Status: DC

## 2016-01-27 MED FILL — PRENATAL VITAMIN PLUS LOW I: 27-1 | 30 days supply | Qty: 30 | Fill #0

## 2016-01-27 NOTE — Progress Notes (Signed)
Subjective:        Sherry Sheppard is a 30 y.o. female here for a routine exam.  Current complaints: None.    Personal health questionnaire:  Is patient [name], have a family history of breast and/or ovarian cancer: no Is there a family history of uterine cancer diagnosed at age < 56, gastrointestinal cancer, urinary tract cancer, family member who is a Personnel officer syndrome-associated carrier: no Is the patient overweight and hypertensive, family history of diabetes, personal history of gestational diabetes, preeclampsia or PCOS: no Is patient over 19, have PCOS,  family history of premature CHD under age 25, diabetes, smoke, have hypertension or peripheral artery disease:  no At any time, has a partner hit, kicked or otherwise hurt or frightened you?: no Over the past 2 weeks, have you felt down, depressed or hopeless?: no Over the past 2 weeks, have you felt little interest or pleasure in doing things?:no   Gynecologic History Patient's last menstrual period was 01/27/2016. Contraception: IUD Last Pap: 2016. Results were: normal Last mammogram: n/a. Results were: n/a  Obstetric History OB History  Gravida Para Term Preterm AB SAB TAB Ectopic Multiple Living  # Outcome Date GA Lbr Len/2nd Weight Sex Delivery Anes PTL Lv  3 Term 08/24/12 [redacted]w[redacted]d  8 lb 0.8 oz (3.65 kg) M CS-Vac Spinal  Y  2 SAB 2013             Comments: System Generated. Please review and update pregnancy details.  1 Term 04/07/08    Sherry Sheppard   Y     Comments: c/S because FTP and fetal distress      Past Medical History  Diagnosis Date  . Allergic rhinitis   . Seizures (HCC)     last event 08/24/12  . Migraines     Past Surgical History  Procedure Laterality Date  . Cesarean section  2009  . Tooth extraction  2012  . Dilation and evacuation  09/20/2011    Procedure: DILATATION AND EVACUATION;  Surgeon: Brock Bad, MD;  Location: [facility];  Service: Gynecology;   Laterality: N/A;  . Cesarean section N/A 08/24/2012    Procedure: CESAREAN SECTION;  Surgeon: Kathreen Cosier, MD;  Location: [facility];  Service: Obstetrics;  Laterality: N/A;  . 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  albuterol (PROVENTIL HFA;VENTOLIN HFA) 108 (90 Base) MCG/ACT inhaler, Inhale 2 puffs into the lungs every 6 (six) hours as needed for wheezing or shortness of breath., Disp: 1 Inhaler, Rfl: 0 .  Diclofenac Potassium (CAMBIA) 50 MG PACK, Take by mouth. PRN for migraines., Disp: , Rfl:  .  fluticasone (FLONASE) 50 MCG/ACT nasal spray, Place 2 sprays into both nostrils daily., Disp: 16 g, Rfl: 6 .  levonorgestrel (MIRENA) 20 MCG/24HR IUD, 1 Intra Uterine Device (1 each total) by Intrauterine route once., Disp: 1 each, Rfl: 0 .  naproxen (NAPROSYN) 250 MG tablet, Take by mouth 2 (two) times daily with a meal., Disp: , Rfl:  .  ondansetron (ZOFRAN) 4 MG tablet, Take 4 mg by mouth every 8 (eight) hours as needed for nausea or vomiting., Disp: , Rfl:  .  topiramate (TOPAMAX) 25 MG tablet, Take 75 mg by mouth daily. , Disp: , Rfl:  .  HYDROcodone-homatropine (HYCODAN) 5-1.5 MG/5ML syrup, Take 5 mLs by mouth every  8 (eight) hours as needed for cough. (Patient not taking: Reported on 01/27/2016), Disp: 120 mL, Rfl: 0 Allergies  Allergen Reactions  . Banana Shortness Of Breath    Social History  Substance Use Topics  . Smoking status: Never Smoker   . Smokeless tobacco: Never Used  . Alcohol Use: No    Family History  Problem Relation Age of Onset  . Hyperlipidemia Mother   . Lung cancer Maternal Grandmother [phone]  . Hypertension Maternal Aunt   . Diabetes Cousin       Review of Systems  Constitutional: negative for fatigue and weight loss Respiratory: negative for cough and wheezing Cardiovascular: negative for chest pain, fatigue and palpitations Gastrointestinal: negative for abdominal pain and change in bowel  habits Musculoskeletal:negative for myalgias Neurological: negative for gait problems and tremors Behavioral/Psych: negative for abusive relationship, depression Endocrine: negative for temperature intolerance   Genitourinary:negative for abnormal menstrual periods, genital lesions, hot flashes, sexual problems and vaginal discharge Integument/breast: negative for breast lump, breast tenderness, nipple discharge and skin lesion(s)    Objective:       BP 99/67 mmHg  Pulse 90  Temp(Src) 99 F (37.2 C)  Wt 143 lb 3.2 oz (64.955 kg)  LMP 01/27/2016 General:   alert  Skin:   no rash or abnormalities  Lungs:   clear to auscultation bilaterally  Heart:   regular rate and rhythm, S1, S2 normal, no murmur, click, rub or gallop  Breasts:   normal without suspicious masses, skin or nipple changes or axillary nodes  Abdomen:  normal findings: no organomegaly, soft, non-tender and no hernia  Pelvis:  External genitalia: normal general appearance Urinary system: urethral meatus normal and bladder without fullness, nontender Vaginal: normal without tenderness, induration or masses Cervix: normal appearance Adnexa: normal bimanual exam Uterus: anteverted and non-tender, normal size   Lab Review Urine pregnancy test Labs reviewed yes Radiologic studies reviewed yes    Assessment:    Healthy female exam.    IUD Surveillance.  Considering IUD Removal next year and conceiving.  Preconception Counseling and Advice  H/O AVM.  Shrinking with radiologic treatments.  H/O Migraines.  Stable on Topamax prn.   Plan:     Folic Acid Rx  Education reviewed: calcium supplements, low fat, low cholesterol diet, self breast exams and weight bearing exercise. Contraception: IUD. Follow up in: 1 year.   No orders of the defined types were placed in this encounter.   Orders Placed This Encounter  Procedures  . HIV antibody  . Hepatitis B surface antigen  . RPR  . Hepatitis C antibody  . POCT  urinalysis dipstick

## 2016-01-28 LAB — HIV ANTIBODY (ROUTINE TESTING W REFLEX): HIV Screen 4th Generation wRfx: NONREACTIVE

## 2016-01-28 LAB — HEPATITIS B SURFACE ANTIGEN: Hepatitis B Surface Ag: NEGATIVE

## 2016-01-28 LAB — RPR: RPR Ser Ql: NONREACTIVE

## 2016-01-28 LAB — HEPATITIS C ANTIBODY: Hep C Virus Ab: <0.1 s/co ratio (ref 0.0–0.9)

## 2016-01-31 LAB — PAP IG W/ RFLX HPV ASCU: PAP Smear Comment: 0

## 2016-02-01 ENCOUNTER — Encounter: Payer: Self-pay | Admitting: Obstetrics

## 2016-02-01 LAB — NUSWAB VG+, CANDIDA 6SP
CANDIDA GLABRATA, NAA: NEGATIVE
CANDIDA KRUSEI, NAA: NEGATIVE
CANDIDA TROPICALIS, NAA: NEGATIVE
Candida albicans, NAA: NEGATIVE
Candida lusitaniae, NAA: NEGATIVE
Candida parapsilosis, NAA: NEGATIVE
Chlamydia trachomatis, NAA: NEGATIVE
NEISSERIA GONORRHOEAE, NAA: NEGATIVE
TRICH VAG BY NAA: NEGATIVE

## 2016-02-23 MED FILL — TOPIRAMATE 25 MG TABLET: 25 | 30 days supply | Qty: 90 | Fill #5

## 2016-02-23 MED FILL — NAPROXEN SODIUM 550 MG TAB: 550 | 35 days supply | Qty: 20 | Fill #1

## 2016-03-01 MED FILL — PRENATAL VITAMIN PLUS LOW I: 27-1 | 90 days supply | Qty: 90 | Fill #1

## 2016-03-02 DIAGNOSIS — Q282 Arteriovenous malformation of cerebral vessels: Secondary | ICD-10-CM | POA: Diagnosis not present

## 2016-03-02 DIAGNOSIS — G43009 Migraine without aura, not intractable, without status migrainosus: Secondary | ICD-10-CM | POA: Diagnosis not present

## 2016-03-02 DIAGNOSIS — Z79899 Other long term (current) drug therapy: Secondary | ICD-10-CM | POA: Diagnosis not present

## 2016-03-02 DIAGNOSIS — R5383 Other fatigue: Secondary | ICD-10-CM | POA: Diagnosis not present

## 2016-03-02 DIAGNOSIS — H5462 Unqualified visual loss, left eye, normal vision right eye: Secondary | ICD-10-CM | POA: Diagnosis not present

## 2016-03-24 MED FILL — TOPIRAMATE 25 MG TABLET: 25 | 30 days supply | Qty: 90 | Fill #0

## 2016-04-24 MED FILL — TOPIRAMATE 25 MG TABLET: 25 | 30 days supply | Qty: 90 | Fill #1

## 2016-05-26 MED FILL — TOPIRAMATE 25 MG TABLET: 25 | 30 days supply | Qty: 90 | Fill #2

## 2016-06-09 DIAGNOSIS — H5462 Unqualified visual loss, left eye, normal vision right eye: Secondary | ICD-10-CM | POA: Diagnosis not present

## 2016-06-09 DIAGNOSIS — Z923 Personal history of irradiation: Secondary | ICD-10-CM | POA: Diagnosis not present

## 2016-06-09 DIAGNOSIS — H53469 Homonymous bilateral field defects, unspecified side: Secondary | ICD-10-CM | POA: Diagnosis not present

## 2016-06-09 DIAGNOSIS — H18893 Other specified disorders of cornea, bilateral: Secondary | ICD-10-CM | POA: Diagnosis not present

## 2016-06-09 DIAGNOSIS — H18603 Keratoconus, unspecified, bilateral: Secondary | ICD-10-CM | POA: Diagnosis not present

## 2016-06-09 DIAGNOSIS — Q282 Arteriovenous malformation of cerebral vessels: Secondary | ICD-10-CM | POA: Diagnosis not present

## 2016-06-21 DIAGNOSIS — Z8774 Personal history of (corrected) congenital malformations of heart and circulatory system: Secondary | ICD-10-CM | POA: Diagnosis not present

## 2016-06-21 DIAGNOSIS — Z09 Encounter for follow-up examination after completed treatment for conditions other than malignant neoplasm: Secondary | ICD-10-CM | POA: Diagnosis not present

## 2016-06-21 DIAGNOSIS — Q282 Arteriovenous malformation of cerebral vessels: Secondary | ICD-10-CM | POA: Diagnosis not present

## 2016-06-21 DIAGNOSIS — Z9889 Other specified postprocedural states: Secondary | ICD-10-CM | POA: Diagnosis not present

## 2016-06-21 DIAGNOSIS — H53461 Homonymous bilateral field defects, right side: Secondary | ICD-10-CM | POA: Diagnosis not present

## 2016-06-21 DIAGNOSIS — Z923 Personal history of irradiation: Secondary | ICD-10-CM | POA: Diagnosis not present

## 2016-06-26 MED FILL — PRENATAL VITAMIN PLUS LOW I: 27-1 | 90 days supply | Qty: 90 | Fill #2

## 2016-06-28 MED FILL — TOPIRAMATE 25 MG TABLET: 25 | 30 days supply | Qty: 90 | Fill #3

## 2016-07-31 MED FILL — TOPIRAMATE 25 MG TABLET: 25 | 30 days supply | Qty: 90 | Fill #4

## 2016-07-31 MED FILL — NAPROXEN SODIUM 550 MG TAB: 550 | 10 days supply | Qty: 20 | Fill #0

## 2016-08-07 ENCOUNTER — Encounter: Payer: 59 | Admitting: Internal Medicine

## 2016-08-31 MED FILL — TOPIRAMATE 25 MG TAB: 25 | 30 days supply | Qty: 90 | Fill #5

## 2016-10-02 MED FILL — TOPIRAMATE 25 MG TAB: 25 | 30 days supply | Qty: 90 | Fill #0

## 2016-10-24 ENCOUNTER — Encounter: Payer: Self-pay | Admitting: Internal Medicine

## 2016-10-24 DIAGNOSIS — H5462 Unqualified visual loss, left eye, normal vision right eye: Secondary | ICD-10-CM | POA: Diagnosis not present

## 2016-10-24 DIAGNOSIS — H18603 Keratoconus, unspecified, bilateral: Secondary | ICD-10-CM | POA: Diagnosis not present

## 2016-10-24 DIAGNOSIS — H53469 Homonymous bilateral field defects, unspecified side: Secondary | ICD-10-CM | POA: Diagnosis not present

## 2016-10-24 DIAGNOSIS — Q282 Arteriovenous malformation of cerebral vessels: Secondary | ICD-10-CM | POA: Diagnosis not present

## 2016-10-24 DIAGNOSIS — Z888 Allergy status to other drugs, medicaments and biological substances status: Secondary | ICD-10-CM | POA: Diagnosis not present

## 2016-10-24 DIAGNOSIS — I729 Aneurysm of unspecified site: Secondary | ICD-10-CM | POA: Diagnosis not present

## 2016-10-24 DIAGNOSIS — R51 Headache: Secondary | ICD-10-CM | POA: Diagnosis not present

## 2016-10-24 DIAGNOSIS — Z79899 Other long term (current) drug therapy: Secondary | ICD-10-CM | POA: Diagnosis not present

## 2016-10-30 ENCOUNTER — Encounter: Payer: Self-pay | Admitting: Internal Medicine

## 2016-11-06 MED FILL — CAMBIA 50 MG POWDER PACKET: 50 | 30 days supply | Qty: 9 | Fill #0

## 2016-11-06 MED FILL — TOPIRAMATE 25 MG TABLET: 25 | 30 days supply | Qty: 90 | Fill #1

## 2016-12-01 ENCOUNTER — Encounter: Payer: Self-pay | Admitting: Internal Medicine

## 2016-12-01 ENCOUNTER — Ambulatory Visit (INDEPENDENT_AMBULATORY_CARE_PROVIDER_SITE_OTHER): Payer: 59 | Admitting: Internal Medicine

## 2016-12-01 DIAGNOSIS — L729 Follicular cyst of the skin and subcutaneous tissue, unspecified: Secondary | ICD-10-CM

## 2016-12-01 MED ORDER — SULFAMETHOXAZOLE-TRIMETHOPRIM 800-160 MG PO TABS: 1.0000 | ORAL_TABLET | Freq: Two times a day (BID) | ORAL | 0 refills | Status: DC

## 2016-12-01 MED FILL — SULFAMETHOXAZOLE/TMP DS TAB: 800-160 | 7 days supply | Qty: 14 | Fill #0

## 2016-12-01 NOTE — Progress Notes (Signed)
   Subjective:    Patient ID: Sherry Sheppard, female    DOB: 08/26/1985, 31 y.o.   MRN: [id number]  HPI The patient is a 31 YO female coming in for cyst on her buttock which started about 4-5 days ago. She denies fevers or chills. No drainage from the area. Located below the gluteal crest. Denies constipation or diarrhea. No blood in her stools. She has not tried anything for it. Taken some aleve for pain when sitting. Overall is worsening slightly since onset. Pain is worse.   Review of Systems  Constitutional: Negative.   Respiratory: Negative.   Cardiovascular: Negative.   Gastrointestinal: Negative.        Pain with sitting  Musculoskeletal: Positive for arthralgias and myalgias.  Skin: Positive for color change. Negative for rash and wound.  Neurological: Negative.       Objective:   Physical Exam  Constitutional: She is oriented to person, place, and time. She appears well-developed and well-nourished.  HENT:  Head: Normocephalic and atraumatic.  Eyes: EOM are normal.  Cardiovascular: Normal rate and regular rhythm.   Pulmonary/Chest: Effort normal.  Abdominal: Soft. Bowel sounds are normal. She exhibits no distension and no mass. There is no tenderness. There is no rebound and no guarding.  Musculoskeletal: She exhibits no edema.  Neurological: She is alert and oriented to person, place, and time.  Skin: Skin is warm and dry. No rash noted. There is erythema. No pallor.  Small cyst below the gluteal cleft, midline. No drainage expressible with small abscess hard underneath the skin.    Vitals:   12/01/16 1112  BP: 104/70  Pulse: 80  Resp: 12  Temp: 98.5 F (36.9 C)  TempSrc: Oral  SpO2: 98%  Weight: 142 lb (64.4 kg)  Height: 5\' 5"  (1.651 m)      Assessment & Plan:

## 2016-12-01 NOTE — Assessment & Plan Note (Signed)
Not amenable to I and D due to size and firmness. Rx for bactrim 1 week and if no improvement re-evaluation for I and D

## 2016-12-01 NOTE — Patient Instructions (Signed)
We have sent in the bactrim which is 1 pill twice a day for 1 week.

## 2016-12-07 MED FILL — NAPROXEN SODIUM 550 MG TAB: 550 | 10 days supply | Qty: 20 | Fill #1

## 2016-12-07 MED FILL — TOPIRAMATE 25 MG TAB: 25 | 30 days supply | Qty: 90 | Fill #2

## 2017-01-08 ENCOUNTER — Encounter: Payer: 59 | Admitting: Internal Medicine

## 2017-01-09 MED FILL — TOPIRAMATE 25 MG TAB: 25 | 30 days supply | Qty: 90 | Fill #3

## 2017-01-29 ENCOUNTER — Encounter: Payer: Self-pay | Admitting: Obstetrics

## 2017-01-29 ENCOUNTER — Ambulatory Visit (INDEPENDENT_AMBULATORY_CARE_PROVIDER_SITE_OTHER): Payer: 59 | Admitting: Obstetrics

## 2017-01-29 VITALS — BP 100/67 | HR 80 | Wt 140.8 lb

## 2017-01-29 DIAGNOSIS — Q282 Arteriovenous malformation of cerebral vessels: Secondary | ICD-10-CM

## 2017-01-29 DIAGNOSIS — Z01419 Encounter for gynecological examination (general) (routine) without abnormal findings: Secondary | ICD-10-CM | POA: Diagnosis not present

## 2017-01-29 DIAGNOSIS — Z1151 Encounter for screening for human papillomavirus (HPV): Secondary | ICD-10-CM | POA: Diagnosis not present

## 2017-01-29 DIAGNOSIS — Z3169 Encounter for other general counseling and advice on procreation: Secondary | ICD-10-CM

## 2017-01-29 DIAGNOSIS — Z30431 Encounter for routine checking of intrauterine contraceptive device: Secondary | ICD-10-CM

## 2017-01-29 DIAGNOSIS — Z113 Encounter for screening for infections with a predominantly sexual mode of transmission: Secondary | ICD-10-CM | POA: Diagnosis not present

## 2017-01-29 DIAGNOSIS — Z0000 Encounter for general adult medical examination without abnormal findings: Secondary | ICD-10-CM

## 2017-01-29 DIAGNOSIS — Z124 Encounter for screening for malignant neoplasm of cervix: Secondary | ICD-10-CM

## 2017-01-29 DIAGNOSIS — G43101 Migraine with aura, not intractable, with status migrainosus: Secondary | ICD-10-CM

## 2017-01-29 MED ORDER — PNV PRENATAL PLUS MULTIVITAMIN 27-1 MG PO TABS: 1.0000 | ORAL_TABLET | Freq: Every day | ORAL | 11 refills | Status: DC

## 2017-01-29 MED FILL — PRENATAL VITAMIN PLUS LOW I: 27-1 | 30 days supply | Qty: 30 | Fill #0

## 2017-01-29 NOTE — Progress Notes (Signed)
Subjective:        Noelene Albertine GratesG Clark is a 31 y.o. female here for a routine exam.  Current complaints: None.    Personal health questionnaire:  Is patient [name], have a family history of breast and/or ovarian cancer: no Is there a family history of uterine cancer diagnosed at age < 5250, gastrointestinal cancer, urinary tract cancer, family member who is a Personnel officerLynch syndrome-associated carrier: no Is the patient overweight and hypertensive, family history of diabetes, personal history of gestational diabetes, preeclampsia or PCOS: no Is patient over 4755, have PCOS,  family history of premature CHD under age 30, diabetes, smoke, have hypertension or peripheral artery disease:  no At any time, has a partner hit, kicked or otherwise hurt or frightened you?: no Over the past 2 weeks, have you felt down, depressed or hopeless?: no Over the past 2 weeks, have you felt little interest or pleasure in doing things?:no   Gynecologic History No LMP recorded. Patient is not currently having periods (Reason: IUD). Contraception: IUD Last Pap: 2017. Results were: normal Last mammogram: n/a. Results were: n/a  Obstetric History OB History  Gravida Para Term Preterm AB Living  3 2 2   1 2   SAB TAB Ectopic Multiple Live Births  1       2    # Outcome Date GA Lbr Len/2nd Weight Sex Delivery Anes PTL Lv  3 Term 08/24/12 452w3d  8 lb 0.8 oz (3.65 kg) M CS-Vac Spinal  LIV  2 SAB 2013             Birth Comments: System Generated. Please review and update pregnancy details.  1 Term 04/07/08    M CS-LTranv   LIV     Birth Comments: c/S because FTP and fetal distress      Past Medical History:  Diagnosis Date  . Allergic rhinitis   . Migraines   . Seizures (HCC)    last event 08/24/12    Past Surgical History:  Procedure Laterality Date  . 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 CESAREAN SECTION  2009  . CESAREAN SECTION N/A 08/24/2012   Procedure: CESAREAN SECTION;   Surgeon: Kathreen CosierBernard A Marshall, MD;  Location: [facility];  Service: Obstetrics;  Laterality: N/A;  . DILATION AND EVACUATION  09/20/2011   Procedure: DILATATION AND EVACUATION;  Surgeon: Brock Badharles A Sequoyah Ramone, MD;  Location: [facility];  Service: Gynecology;  Laterality: N/A;  . TOOTH EXTRACTION  2012     Current Outpatient Prescriptions:  .  albuterol (PROVENTIL HFA;VENTOLIN HFA) 108 (90 Base) MCG/ACT inhaler, Inhale 2 puffs into the lungs every 6 (six) hours as needed for wheezing or shortness of breath., Disp: 1 Inhaler, Rfl: 0 .  Diclofenac Potassium (CAMBIA) 50 MG PACK, Take by mouth. PRN for migraines., Disp: , Rfl:  .  fluticasone (FLONASE) 50 MCG/ACT nasal spray, Place 2 sprays into both nostrils daily., Disp: 16 g, Rfl: 6 .  levonorgestrel (MIRENA) 20 MCG/24HR IUD, 1 Intra Uterine Device (1 each total) by Intrauterine route once., Disp: 1 each, Rfl: 0 .  naproxen (NAPROSYN) 250 MG tablet, Take by mouth 2 (two) times daily with a meal., Disp: , Rfl:  .  ondansetron (ZOFRAN) 4 MG tablet, Take 4 mg by mouth every 8 (eight) hours as needed for nausea or vomiting., Disp: , Rfl:  .  Prenatal Vit-Fe Fumarate-FA (PNV PRENATAL PLUS MULTIVITAMIN) 27-1 MG TABS, Take 1 tablet by mouth daily before  breakfast., Disp: 30 tablet, Rfl: 11 .  topiramate (TOPAMAX) 25 MG tablet, Take 75 mg by mouth daily. , Disp: , Rfl:  Allergies  Allergen Reactions  . Banana Shortness Of Breath  . Other Nausea And Vomiting    Contrast dye    Social History  Substance Use Topics  . Smoking status: Never Smoker  . Smokeless tobacco: Never Used  . Alcohol use No    Family History  Problem Relation Age of Onset  . Hyperlipidemia Mother   . Lung cancer Maternal Grandmother 67  . Hypertension Maternal Aunt   . Diabetes Cousin       Review of Systems  Constitutional: negative for fatigue and weight loss Respiratory: negative for cough and wheezing Cardiovascular: negative for chest pain, fatigue and  palpitations Gastrointestinal: negative for abdominal pain and change in bowel habits Musculoskeletal:negative for myalgias Neurological: negative for gait problems and tremors Behavioral/Psych: negative for abusive relationship, depression Endocrine: negative for temperature intolerance    Genitourinary:negative for abnormal menstrual periods, genital lesions, hot flashes, sexual problems and vaginal discharge Integument/breast: negative for breast lump, breast tenderness, nipple discharge and skin lesion(s)    Objective:       BP 100/67   Pulse 80   Wt 140 lb 12.8 oz (63.9 kg)   BMI 23.43 kg/m  General:   alert  Skin:   no rash or abnormalities  Lungs:   clear to auscultation bilaterally  Heart:   regular rate and rhythm, S1, S2 normal, no murmur, click, rub or gallop  Breasts:   normal without suspicious masses, skin or nipple changes or axillary nodes  Abdomen:  normal findings: no organomegaly, soft, non-tender and no hernia  Pelvis:  External genitalia: normal general appearance Urinary system: urethral meatus normal and bladder without fullness, nontender Vaginal: normal without tenderness, induration or masses Cervix: normal appearance Adnexa: normal bimanual exam Uterus: anteverted and non-tender, normal size   Lab Review Urine pregnancy test Labs reviewed yes Radiologic studies reviewed no  50% of 20 min visit spent on counseling and coordination of care.    Assessment and Plan:     1. Encounter for routine gynecological examination with Papanicolaou smear of cervix Rx: - Cytology - PAP - Cervicovaginal ancillary only - Comprehensive metabolic panel - CBC - Hemoglobin A1c  2. AVM (arteriovenous malformation) brain - very stable  3. Migraine with aura and with status migrainosus, not intractable - stable  4. Routine health maintenance Rx: - Prenatal Vit-Fe Fumarate-FA (PNV PRENATAL PLUS MULTIVITAMIN) 27-1 MG TABS; Take 1 tablet by mouth daily before  breakfast.  Dispense: 30 tablet; Refill: 11  5. Encounter for routine checking of intrauterine contraceptive device (IUD) - pleased with Mirena IUD  6. Encounter for preconception consultation - wants to conceive in a year or so, if cleared by Neurology with her AVM - PNV's Rx for preconception Folic Acid neural tube protection   Plan:     Education reviewed: calcium supplements, depression evaluation, low fat, low cholesterol diet, safe sex/STD prevention, self breast exams and weight bearing exercise. Contraception: IUD. Follow up in: 1 year.   Meds ordered this encounter  Medications  . Prenatal Vit-Fe Fumarate-FA (PNV PRENATAL PLUS MULTIVITAMIN) 27-1 MG TABS    Sig: Take 1 tablet by mouth daily before breakfast.    Dispense:  30 tablet    Refill:  11   Orders Placed This Encounter  Procedures  . Comprehensive metabolic panel  . CBC  . Hemoglobin A1c  Patient ID: Marlana Salvage, female   DOB: 1985/09/27, 31 y.o.   MRN: [id number]

## 2017-01-29 NOTE — Progress Notes (Signed)
Patient is in the office for annual exam. Patient is having some vaginal irritation- possibly from change in soap.

## 2017-01-30 LAB — COMPREHENSIVE METABOLIC PANEL
A/G RATIO: 1.6 (ref 1.2–2.2)
ALT: 6 IU/L (ref 0–32)
AST: 14 IU/L (ref 0–40)
Albumin: 4.5 g/dL (ref 3.5–5.5)
Alkaline Phosphatase: 77 IU/L (ref 39–117)
BILIRUBIN TOTAL: 0.3 mg/dL (ref 0.0–1.2)
BUN/Creatinine Ratio: 15 (ref 9–23)
BUN: 11 mg/dL (ref 6–20)
CALCIUM: 9.1 mg/dL (ref 8.7–10.2)
CHLORIDE: 104 mmol/L (ref 96–106)
CO2: 22 mmol/L (ref 20–29)
Creatinine, Ser: 0.73 mg/dL (ref 0.57–1.00)
GFR calc Af Amer: 128 mL/min/{1.73_m2} (ref >59)
GFR calc non Af Amer: 111 mL/min/{1.73_m2} (ref >59)
Globulin, Total: 2.9 g/dL (ref 1.5–4.5)
Glucose: 92 mg/dL (ref 65–99)
POTASSIUM: 3.9 mmol/L (ref 3.5–5.2)
Sodium: 141 mmol/L (ref 134–144)
Total Protein: 7.4 g/dL (ref 6.0–8.5)

## 2017-01-30 LAB — CBC
HEMOGLOBIN: 12.2 g/dL (ref 11.1–15.9)
Hematocrit: 38.2 % (ref 34.0–46.6)
MCH: 27.7 pg (ref 26.6–33.0)
MCHC: 31.9 g/dL (ref 31.5–35.7)
MCV: 87 fL (ref 79–97)
Platelets: 328 10*3/uL (ref 150–379)
RBC: 4.41 x10E6/uL (ref 3.77–5.28)
RDW: 13.3 % (ref 12.3–15.4)
WBC: 9.5 10*3/uL (ref 3.4–10.8)

## 2017-01-30 LAB — HEMOGLOBIN A1C
ESTIMATED AVERAGE GLUCOSE: 103 mg/dL
HEMOGLOBIN A1C: 5.2 % (ref 4.8–5.6)

## 2017-01-30 LAB — CERVICOVAGINAL ANCILLARY ONLY
BACTERIAL VAGINITIS: NEGATIVE
CANDIDA VAGINITIS: POSITIVE — AB
Chlamydia: NEGATIVE
NEISSERIA GONORRHEA: NEGATIVE
TRICH (WINDOWPATH): NEGATIVE

## 2017-01-31 ENCOUNTER — Encounter: Payer: Self-pay | Admitting: Family Medicine

## 2017-01-31 ENCOUNTER — Ambulatory Visit: Payer: Self-pay

## 2017-01-31 ENCOUNTER — Telehealth: Payer: Self-pay

## 2017-01-31 ENCOUNTER — Other Ambulatory Visit: Payer: Self-pay | Admitting: Obstetrics

## 2017-01-31 ENCOUNTER — Ambulatory Visit (INDEPENDENT_AMBULATORY_CARE_PROVIDER_SITE_OTHER): Payer: 59 | Admitting: Family Medicine

## 2017-01-31 VITALS — BP 110/70 | HR 79 | Ht 65.0 in

## 2017-01-31 DIAGNOSIS — S92314A Nondisplaced fracture of first metatarsal bone, right foot, initial encounter for closed fracture: Secondary | ICD-10-CM | POA: Diagnosis not present

## 2017-01-31 DIAGNOSIS — B3731 Acute candidiasis of vulva and vagina: Secondary | ICD-10-CM

## 2017-01-31 DIAGNOSIS — M79671 Pain in right foot: Secondary | ICD-10-CM

## 2017-01-31 DIAGNOSIS — B373 Candidiasis of vulva and vagina: Secondary | ICD-10-CM

## 2017-01-31 LAB — CYTOLOGY - PAP
DIAGNOSIS: NEGATIVE
HPV: NOT DETECTED

## 2017-01-31 MED ORDER — FLUCONAZOLE 150 MG PO TABS: 150.0000 mg | ORAL_TABLET | Freq: Once | ORAL | 0 refills | Status: AC

## 2017-01-31 MED ORDER — VITAMIN D (ERGOCALCIFEROL) 1.25 MG (50000 UNIT) PO CAPS: 50000.0000 [IU] | ORAL_CAPSULE | ORAL | 0 refills | Status: DC

## 2017-01-31 MED FILL — FLUCONAZOLE 150 MG TABLET: 150 | 1 days supply | Qty: 1 | Fill #0

## 2017-01-31 MED FILL — VIT D2 1.25 MG (50,000 UNIT: 1.25 MG | 84 days supply | Qty: 12 | Fill #0

## 2017-01-31 NOTE — Assessment & Plan Note (Signed)
Patient is a nondisplaced fracture of the first metatarsal. Discussed with patient about this. Possible Topamax causing some trouble. We discussed icing regimen, home exercises, which activities doing which ones to avoid. Patient will start increasing activity as tolerated. Patient will wear a rigid sole shoe. Once weekly vitamin D. Follow-up again in 3-4 weeks

## 2017-01-31 NOTE — Progress Notes (Signed)
Sherry Sheppard D.O. [facility] [address] [place], [place] [address] Phone: [phone] Subjective:    I'm seeing this patient by the request  of:    CC: Right foot pain  WGN:[id number]:Subjective  Sherry Sheppard is a 31 y.o. female coming in with complaint of right foot pain. Patient was running with her son's and unfortunately started having increasing discomfort. Patient describes the pain as a dull, throbbing aching pain. Patient states that over the course last week it seems to be worsening. Seems to be on the medial aspect of the foot. Patient denies any swelling. Patient states though that anytime she ambulate she has worsening pain. Rates the severity pain as 7 out 10. Only seems to alleviate the pain when patient's stops walking on it.      Past Medical History:  Diagnosis Date  . Allergic rhinitis   . Migraines   . Seizures (HCC)    last event 08/24/12   Past Surgical History:  Procedure Laterality Date  . 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 CESAREAN SECTION  2009  . CESAREAN SECTION N/A 08/24/2012   Procedure: CESAREAN SECTION;  Surgeon: Kathreen CosierBernard A Marshall, MD;  Location: [facility];  Service: Obstetrics;  Laterality: N/A;  . DILATION AND EVACUATION  09/20/2011   Procedure: DILATATION AND EVACUATION;  Surgeon: Brock Badharles A Harper, MD;  Location: [facility];  Service: Gynecology;  Laterality: N/A;  . TOOTH EXTRACTION  2012   Social History   Social History  . Marital status: Single    Spouse name: N/A  . Number of children: 2  . Years of education: N/A   Occupational History  . Nurse Tech/Secretary  [employer]   Social History Main Topics  . Smoking status: Never Smoker  . Smokeless tobacco: Never Used  . Alcohol use No  . Drug use: No  . Sexual activity: Yes    Partners: Male    Birth control/ protection: IUD   Other Topics Concern  . 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   Single, lives with sons   Allergies  Allergen Reactions  . Banana Shortness Of Breath  . Other Nausea And Vomiting    Contrast dye   Family History  Problem Relation Age of Onset  . Hyperlipidemia Mother   . Lung cancer Maternal Grandmother [id number]  . Hypertension Maternal Aunt   . Diabetes Cousin      Past medical history, social, surgical and family history all reviewed in electronic medical record.  No pertanent information unless stated regarding to the chief complaint.   Review of Systems:Review of systems updated and as accurate as of 01/31/17  No headache, visual changes, nausea, vomiting, diarrhea, constipation, dizziness, abdominal pain, skin rash, fevers, chills, night sweats, weight loss, swollen lymph nodes, body aches, joint swelling, muscle aches, chest pain, shortness of breath, mood changes.   Objective  Blood pressure 110/70, pulse 79, height 5\' 5"  (1.651 m), SpO2 99 %. Systems examined below as of 01/31/17   General: No apparent distress alert and oriented x3 mood and affect normal, dressed appropriately.  HEENT: Pupils equal, extraocular movements intact  Respiratory: Patient's speak in full sentences and does not appear short of breath  Cardiovascular: No lower extremity edema, non tender, no erythema  Skin: Warm dry intact with no signs of infection or rash on extremities or on axial skeleton.  Abdomen: Soft nontender  Neuro: Cranial nerves II through XII are intact, neurovascularly intact in all extremities with 2+ DTRs and 2+ pulses.  Lymph: No lymphadenopathy of posterior or anterior cervical chain or axillae bilaterally.  Gait normal with good balance and coordination.  MSK:  Non tender with full range of motion and good stability and symmetric strength and tone of shoulders, elbows, wrist, hip, knee and ankles bilaterally.   Foot exam shows the patient does have a pes planus. Mild pain over the plantar aspect of the foot. Mild pain at the most proximal aspect  of the metatarsal bone. No crepitus noted.  Limited musculoskeletal ultrasound was performed and interpreted by Judi SaaZachary M Latoi Giraldo  Patient does have what appears to be a chronic of defect noted on the plantar aspect of the first metatarsal proximal wing. Impression: Metatarsal fracture    Impression and Recommendations:     This case required medical decision making of moderate complexity.      Note: This dictation was prepared with Dragon dictation along with smaller phrase technology. Any transcriptional errors that result from this process are unintentional.

## 2017-01-31 NOTE — Patient Instructions (Signed)
Good to see you.  Ice 10 minutes 2 times daily. Usually after activity and before bed. pennsaid pinkie amount topically 2 times daily as needed.   Once weekly vitamin D for 12 weeks.  Rigid sole shoe even in the house.  See me again in 3-4 weeks.

## 2017-01-31 NOTE — Telephone Encounter (Signed)
Advised of results and rx sent by provider. 

## 2017-02-01 ENCOUNTER — Ambulatory Visit: Payer: 59 | Admitting: Family Medicine

## 2017-02-12 MED FILL — TOPIRAMATE 25 MG TAB: 25 | 30 days supply | Qty: 90 | Fill #4

## 2017-02-16 ENCOUNTER — Encounter: Payer: Self-pay | Admitting: Obstetrics

## 2017-02-16 ENCOUNTER — Other Ambulatory Visit: Payer: Self-pay | Admitting: Obstetrics

## 2017-02-16 DIAGNOSIS — B3731 Acute candidiasis of vulva and vagina: Secondary | ICD-10-CM

## 2017-02-16 DIAGNOSIS — B373 Candidiasis of vulva and vagina: Secondary | ICD-10-CM

## 2017-02-16 MED ORDER — TERCONAZOLE 0.8 % VA CREA: 1.0000 | TOPICAL_CREAM | Freq: Every day | VAGINAL | 2 refills | Status: DC

## 2017-02-19 MED FILL — TERCONAZOLE 0.8% VAGINAL CR: 0.8 | 3 days supply | Qty: 20 | Fill #0

## 2017-02-22 ENCOUNTER — Ambulatory Visit: Payer: 59 | Admitting: Family Medicine

## 2017-02-27 NOTE — Progress Notes (Signed)
Sherry Sheppard [address] [place], [place] [address] Phone: [phone] Subjective:    I'm seeing this patient by the request  of:    CC: Right foot pain  VOH:[id number]  Sherry Sheppard is a 31 y.o. female coming in with complaint of right foot pain. Found to have a metatarsal fracture. Patient was put in a rigid shoe, and once weekly vitamin D and icing regimen. Patient states Unfortunately pain has not improved significant amount. Seems to be a little bit different. More pain when she extends her toes. Does do still a considerable amount walking. Has been fairly careful on what shoes she's been wearing regularly.       Past Medical History:  Diagnosis Date  . Allergic rhinitis   . Migraines   . Seizures (HCC)    last event 08/24/12   Past Surgical History:  Procedure Laterality Date  . 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 CESAREAN SECTION  2009  . CESAREAN SECTION N/A 08/24/2012   Procedure: CESAREAN SECTION;  Surgeon: Kathreen Cosier, MD;  Location: [facility];  Service: Obstetrics;  Laterality: N/A;  . DILATION AND EVACUATION  09/20/2011   Procedure: DILATATION AND EVACUATION;  Surgeon: Brock Bad, MD;  Location: [facility];  Service: Gynecology;  Laterality: N/A;  . TOOTH EXTRACTION  2012   Social History   Social History  . Marital status: Single    Spouse name: N/A  . Number of children: 2  . Years of education: N/A   Occupational History  . Nurse Tech/Secretary  [employer]   Social History Main Topics  . Smoking status: Never Smoker  . Smokeless tobacco: Never Used  . Alcohol use No  . Drug use: No  . Sexual activity: Yes    Partners: Male    Birth control/ protection: IUD   Other Topics Concern  . None   Social History Narrative   Works as Psychologist, sport and exercise at [employer], [employer] [address]   Single, lives with sons   Allergies  Allergen Reactions  . Banana Shortness Of Breath  . Other  Nausea And Vomiting    Contrast dye   Family History  Problem Relation Age of Onset  . Hyperlipidemia Mother   . Lung cancer Maternal Grandmother 34  . Hypertension Maternal Aunt   . Diabetes Cousin      Past medical history, social, surgical and family history all reviewed in electronic medical record.  No pertanent information unless stated regarding to the chief complaint.   Review of Systems:Review of systems updated and as accurate as of 02/28/17  No headache, visual changes, nausea, vomiting, diarrhea, constipation, dizziness, abdominal pain, skin rash, fevers, chills, night sweats, weight loss, swollen lymph nodes, body aches, joint swelling, muscle aches, chest pain, shortness of breath, mood changes.   Objective  Blood pressure 110/82, pulse 72, height 5\' 5"  (1.651 m), weight 140 lb (63.5 kg).   Systems examined below as of 02/28/17 General: NAD A&O x3 mood, affect normal  HEENT: Pupils equal, extraocular movements intact no nystagmus Respiratory: not short of breath at rest or with speaking Cardiovascular: No lower extremity edema, non tender Skin: Warm dry intact with no signs of infection or rash on extremities or on axial skeleton. Abdomen: Soft nontender, no masses Neuro: Cranial nerves  intact, neurovascularly intact in all extremities with 2+ DTRs and 2+ pulses. Lymph: No lymphadenopathy appreciated today  Gait  normal with good balance and coordination.  MSK: Non tender with full range of motion and good stability and symmetric strength and tone of shoulders, elbows, wrist,  knee hips and ankles bilaterally.   Foot exam shows the patient does have a pes planus. Mild pain over the plantar aspect of the foot.Still mild pain over the proximal aspect of the MTP. Less swelling than previous exam  Limited musculoskeletal ultrasound was performed and interpreted by Judi Saa  Patient's MTP fracture seems to have good callus formation. Patient though does have a  reactive capsulitis noted. Impression: First MTP capsulitis  Procedure: Real-time Ultrasound Guided Injection of right first MTP Device: GE Logiq Q7 Ultrasound guided injection is preferred based studies that show increased duration, increased effect, greater accuracy, decreased procedural pain, increased response rate, and decreased cost with ultrasound guided versus blind injection.  Verbal informed consent obtained.  Time-out conducted.  Noted no overlying erythema, induration, or other signs of local infection.  Skin prepped in a sterile fashion.  Local anesthesia: Topical Ethyl chloride.  With sterile technique and under real time ultrasound guidance:  With a 25-gauge half-inch needle patient was injected with 0.5 mL of 0.5% Marcaine and 0.5 mL of Kenalog 40 mg/dL. Completed without difficulty  Pain immediately resolved suggesting accurate placement of the medication.  Advised to call if fevers/chills, erythema, induration, drainage, or persistent bleeding.  Images permanently stored and available for review in the ultrasound unit.  Impression: Technically successful ultrasound guided injection.    Impression and Recommendations:     This case required medical decision making of moderate complexity.      Note: This dictation was prepared with Dragon dictation along with smaller phrase technology. Any transcriptional errors that result from this process are unintentional.

## 2017-02-28 ENCOUNTER — Ambulatory Visit (INDEPENDENT_AMBULATORY_CARE_PROVIDER_SITE_OTHER): Payer: 59 | Admitting: Family Medicine

## 2017-02-28 ENCOUNTER — Encounter: Payer: Self-pay | Admitting: Family Medicine

## 2017-02-28 ENCOUNTER — Ambulatory Visit: Payer: Self-pay

## 2017-02-28 VITALS — BP 110/82 | HR 72 | Ht 65.0 in | Wt 140.0 lb

## 2017-02-28 DIAGNOSIS — M79671 Pain in right foot: Secondary | ICD-10-CM | POA: Diagnosis not present

## 2017-02-28 DIAGNOSIS — M7751 Other enthesopathy of right foot: Secondary | ICD-10-CM | POA: Diagnosis not present

## 2017-02-28 DIAGNOSIS — S92314A Nondisplaced fracture of first metatarsal bone, right foot, initial encounter for closed fracture: Secondary | ICD-10-CM | POA: Diagnosis not present

## 2017-02-28 NOTE — Assessment & Plan Note (Signed)
Appears to be doing well. Patient does have what seems to be reactive capsulitis that was injected today. We discussed icing regimen, home exercises and continuing the vitamin D supplementation. Patient will continue to avoid being barefoot. Encourage her to do more biking and elliptical for any type of exercise. Follow-up with me again in 4-6 weeks

## 2017-02-28 NOTE — Patient Instructions (Signed)
Good to see you  Sherry Sheppard is your friend. Ice 20 minutes 2 times daily. Usually after activity and before bed. Stay active  We injected the toes today  Continue the vitamin D Avoid being barefoot  See me again in 4-6 weeks if not better

## 2017-02-28 NOTE — Assessment & Plan Note (Signed)
Injected today  Ice is good, continue rigid sole shoe, pennsaid RTC in 4-6 weeks.

## 2017-03-19 MED FILL — TOPIRAMATE 25 MG TAB: 25 | 30 days supply | Qty: 90 | Fill #5

## 2017-04-09 ENCOUNTER — Ambulatory Visit (INDEPENDENT_AMBULATORY_CARE_PROVIDER_SITE_OTHER): Payer: 59

## 2017-04-09 DIAGNOSIS — Z23 Encounter for immunization: Secondary | ICD-10-CM

## 2017-04-11 ENCOUNTER — Ambulatory Visit: Payer: 59 | Admitting: Family Medicine

## 2017-04-16 ENCOUNTER — Telehealth: Payer: 59 | Admitting: Family

## 2017-04-16 DIAGNOSIS — J019 Acute sinusitis, unspecified: Secondary | ICD-10-CM

## 2017-04-16 MED ORDER — AMOXICILLIN-POT CLAVULANATE 875-125 MG PO TABS: 1.0000 | ORAL_TABLET | Freq: Two times a day (BID) | ORAL | 0 refills | Status: DC

## 2017-04-16 NOTE — Progress Notes (Signed)

## 2017-04-17 MED FILL — AMOX TR-K CLV 875-125 MG TA: 875-125 | 7 days supply | Qty: 14 | Fill #0

## 2017-04-20 MED FILL — TOPIRAMATE 25 MG TAB: 25 | 30 days supply | Qty: 90 | Fill #0

## 2017-05-08 ENCOUNTER — Ambulatory Visit (INDEPENDENT_AMBULATORY_CARE_PROVIDER_SITE_OTHER): Payer: 59 | Admitting: Internal Medicine

## 2017-05-08 ENCOUNTER — Other Ambulatory Visit (INDEPENDENT_AMBULATORY_CARE_PROVIDER_SITE_OTHER): Payer: 59

## 2017-05-08 ENCOUNTER — Encounter: Payer: Self-pay | Admitting: Internal Medicine

## 2017-05-08 VITALS — BP 100/70 | HR 70 | Temp 98.6°F | Ht 65.0 in | Wt 141.0 lb

## 2017-05-08 DIAGNOSIS — Q282 Arteriovenous malformation of cerebral vessels: Secondary | ICD-10-CM | POA: Diagnosis not present

## 2017-05-08 DIAGNOSIS — F439 Reaction to severe stress, unspecified: Secondary | ICD-10-CM | POA: Diagnosis not present

## 2017-05-08 DIAGNOSIS — Z0000 Encounter for general adult medical examination without abnormal findings: Secondary | ICD-10-CM | POA: Diagnosis not present

## 2017-05-08 DIAGNOSIS — Z0001 Encounter for general adult medical examination with abnormal findings: Secondary | ICD-10-CM

## 2017-05-08 DIAGNOSIS — H18603 Keratoconus, unspecified, bilateral: Secondary | ICD-10-CM | POA: Diagnosis not present

## 2017-05-08 DIAGNOSIS — R519 Headache, unspecified: Secondary | ICD-10-CM

## 2017-05-08 DIAGNOSIS — G43009 Migraine without aura, not intractable, without status migrainosus: Secondary | ICD-10-CM | POA: Diagnosis not present

## 2017-05-08 DIAGNOSIS — R51 Headache: Secondary | ICD-10-CM | POA: Diagnosis not present

## 2017-05-08 DIAGNOSIS — H53469 Homonymous bilateral field defects, unspecified side: Secondary | ICD-10-CM | POA: Diagnosis not present

## 2017-05-08 LAB — VITAMIN B12: VITAMIN B 12: 321 pg/mL (ref 211–911)

## 2017-05-08 LAB — VITAMIN D 25 HYDROXY (VIT D DEFICIENCY, FRACTURES): VITD: 25.7 ng/mL — AB (ref 30.00–100.00)

## 2017-05-08 NOTE — Assessment & Plan Note (Signed)
Flu shot done, tetanus up to date. Pap smear up to date with gyn. Counseled about routine exercise, sun safety and mole surveillance as well as the dangers of distracted driving. Given screening recommendations.

## 2017-05-08 NOTE — Progress Notes (Signed)
   Subjective:    Patient ID: Sherry Sheppard, female    DOB: 11/24/1985, 31 y.o.   MRN: [id number]  HPI The patient is a 31 YO female coming in for physical.   PMH, [facility], social history reviewed and updated.   Review of Systems  Constitutional: Negative.   HENT: Negative.   Eyes: Negative.   Respiratory: Negative for cough, chest tightness and shortness of breath.   Cardiovascular: Negative for chest pain, palpitations and leg swelling.  Gastrointestinal: Negative for abdominal distention, abdominal pain, constipation, diarrhea, nausea and vomiting.  Musculoskeletal: Negative.   Skin: Negative.   Neurological: Negative.   Psychiatric/Behavioral: Negative.       Objective:   Physical Exam  Constitutional: She is oriented to person, place, and time. She appears well-developed and well-nourished.  HENT:  Head: Normocephalic and atraumatic.  Eyes: EOM are normal.  Neck: Normal range of motion.  Cardiovascular: Normal rate and regular rhythm.   Pulmonary/Chest: Effort normal and breath sounds normal. No respiratory distress. She has no wheezes. She has no rales.  Abdominal: Soft. Bowel sounds are normal. She exhibits no distension. There is no tenderness. There is no rebound.  Musculoskeletal: She exhibits no edema.  Neurological: She is alert and oriented to person, place, and time. Coordination normal.  Skin: Skin is warm and dry.  Psychiatric: She has a normal mood and affect.   Vitals:   05/08/17 0804  BP: 100/70  Pulse: 70  Temp: 98.6 F (37 C)  TempSrc: Oral  SpO2: 100%  Weight: 141 lb (64 kg)  Height: 5\' 5"  (1.651 m)      Assessment & Plan:

## 2017-05-08 NOTE — Assessment & Plan Note (Signed)
Doing better lately but some increased stress with upcoming wedding so she is anticipating some more headaches in the near future.

## 2017-05-08 NOTE — Patient Instructions (Signed)
We are checking the vitamin levels and will send you the results.   Health Maintenance, Female Adopting a healthy lifestyle and getting preventive care can go a long way to promote health and wellness. Talk with your health care provider about what schedule of regular examinations is right for you. This is a good chance for you to check in with your provider about disease prevention and staying healthy. In between checkups, there are plenty of things you can do on your own. Experts have done a lot of research about which lifestyle changes and preventive measures are most likely to keep you healthy. Ask your health care provider for more information. Weight and diet Eat a healthy diet  Be sure to include plenty of vegetables, fruits, low-fat dairy products, and lean protein.  Do not eat a lot of foods high in solid fats, added sugars, or salt.  Get regular exercise. This is one of the most important things you can do for your health. ? Most adults should exercise for at least 150 minutes each week. The exercise should increase your heart rate and make you sweat (moderate-intensity exercise). ? Most adults should also do strengthening exercises at least twice a week. This is in addition to the moderate-intensity exercise.  Maintain a healthy weight  Body mass index (BMI) is a measurement that can be used to identify possible weight problems. It estimates body fat based on height and weight. Your health care provider can help determine your BMI and help you achieve or maintain a healthy weight.  For females 30 years of age and older: ? A BMI below 18.5 is considered underweight. ? A BMI of 18.5 to 24.9 is normal. ? A BMI of 25 to 29.9 is considered overweight. ? A BMI of 30 and above is considered obese.  Watch levels of cholesterol and blood lipids  You should start having your blood tested for lipids and cholesterol at 30 years of age, then have this test every 5 years.  You may need to  have your cholesterol levels checked more often if: ? Your lipid or cholesterol levels are high. ? You are older than 30 years of age. ? You are at high risk for heart disease.  Cancer screening Lung Cancer  Lung cancer screening is recommended for adults 30-59 years old who are at high risk for lung cancer because of a history of smoking.  A yearly low-dose CT scan of the lungs is recommended for people who: ? Currently smoke. ? Have quit within the past 15 years. ? Have at least a 30-pack-year history of smoking. A pack year is smoking an average of one pack of cigarettes a day for 1 year.  Yearly screening should continue until it has been 15 years since you quit.  Yearly screening should stop if you develop a health problem that would prevent you from having lung cancer treatment.  Breast Cancer  Practice breast self-awareness. This means understanding how your breasts normally appear and feel.  It also means doing regular breast self-exams. Let your health care provider know about any changes, no matter how small.  If you are in your 30s, you should have a clinical breast exam (CBE) by a health care provider every 1-3 years as part of a regular health exam.  If you are 53 or older, have a CBE every year. Also consider having a breast X-ray (mammogram) every year.  If you have a family history of breast cancer, talk to  your health care provider about genetic screening.  If you are at high risk for breast cancer, talk to your health care provider about having an MRI and a mammogram every year.  Breast cancer gene (BRCA) assessment is recommended for women who have family members with BRCA-related cancers. BRCA-related cancers include: ? Breast. ? Ovarian. ? Tubal. ? Peritoneal cancers.  Results of the assessment will determine the need for genetic counseling and BRCA1 and BRCA2 testing.  Cervical Cancer Your health care provider may recommend that you be screened  regularly for cancer of the pelvic organs (ovaries, uterus, and vagina). This screening involves a pelvic examination, including checking for microscopic changes to the surface of your cervix (Pap test). You may be encouraged to have this screening done every 3 years, beginning at age 30.  For women ages 9-65, health care providers may recommend pelvic exams and Pap testing every 3 years, or they may recommend the Pap and pelvic exam, combined with testing for human papilloma virus (HPV), every 5 years. Some types of HPV increase your risk of cervical cancer. Testing for HPV may also be done on women of any age with unclear Pap test results.  Other health care providers may not recommend any screening for nonpregnant women who are considered low risk for pelvic cancer and who do not have symptoms. Ask your health care provider if a screening pelvic exam is right for you.  If you have had past treatment for cervical cancer or a condition that could lead to cancer, you need Pap tests and screening for cancer for at least 20 years after your treatment. If Pap tests have been discontinued, your risk factors (such as having a new sexual partner) need to be reassessed to determine if screening should resume. Some women have medical problems that increase the chance of getting cervical cancer. In these cases, your health care provider may recommend more frequent screening and Pap tests.  Colorectal Cancer  This type of cancer can be detected and often prevented.  Routine colorectal cancer screening usually begins at 30 years of age and continues through 30 years of age.  Your health care provider may recommend screening at an earlier age if you have risk factors for colon cancer.  Your health care provider may also recommend using home test kits to check for hidden blood in the stool.  A small camera at the end of a tube can be used to examine your colon directly (sigmoidoscopy or colonoscopy). This is  done to check for the earliest forms of colorectal cancer.  Routine screening usually begins at age 45.  Direct examination of the colon should be repeated every 5-10 years through 30 years of age. However, you may need to be screened more often if early forms of precancerous polyps or small growths are found.  Skin Cancer  Check your skin from head to toe regularly.  Tell your health care provider about any new moles or changes in moles, especially if there is a change in a mole's shape or color.  Also tell your health care provider if you have a mole that is larger than the size of a pencil eraser.  Always use sunscreen. Apply sunscreen liberally and repeatedly throughout the day.  Protect yourself by wearing long sleeves, pants, a wide-brimmed hat, and sunglasses whenever you are outside.  Heart disease, diabetes, and high blood pressure  High blood pressure causes heart disease and increases the risk of stroke. High blood pressure is more likely  to develop in: ? People who have blood pressure in the high end of the normal range (130-139/85-89 mm Hg). ? People who are overweight or obese. ? People who are African American.  If you are 41-24 years of age, have your blood pressure checked every 3-5 years. If you are 31 years of age or older, have your blood pressure checked every year. You should have your blood pressure measured twice-once when you are at a hospital or clinic, and once when you are not at a hospital or clinic. Record the average of the two measurements. To check your blood pressure when you are not at a hospital or clinic, you can use: ? An automated blood pressure machine at a pharmacy. ? A home blood pressure monitor.  If you are between 45 years and 72 years old, ask your health care provider if you should take aspirin to prevent strokes.  Have regular diabetes screenings. This involves taking a blood sample to check your fasting blood sugar level. ? If you are  at a normal weight and have a low risk for diabetes, have this test once every three years after 30 years of age. ? If you are overweight and have a high risk for diabetes, consider being tested at a younger age or more often. Preventing infection Hepatitis B  If you have a higher risk for hepatitis B, you should be screened for this virus. You are considered at high risk for hepatitis B if: ? You were born in a country where hepatitis B is common. Ask your health care provider which countries are considered high risk. ? Your parents were born in a high-risk country, and you have not been immunized against hepatitis B (hepatitis B vaccine). ? You have HIV or AIDS. ? You use needles to inject street drugs. ? You live with someone who has hepatitis B. ? You have had sex with someone who has hepatitis B. ? You get hemodialysis treatment. ? You take certain medicines for conditions, including cancer, organ transplantation, and autoimmune conditions.  Hepatitis C  Blood testing is recommended for: ? Everyone born from [birth year] through 1964. ? Anyone with known risk factors for hepatitis C.  Sexually transmitted infections (STIs)  You should be screened for sexually transmitted infections (STIs) including gonorrhea and chlamydia if: ? You are sexually active and are younger than 30 years of age. ? You are older than 30 years of age and your health care provider tells you that you are at risk for this type of infection. ? Your sexual activity has changed since you were last screened and you are at an increased risk for chlamydia or gonorrhea. Ask your health care provider if you are at risk.  If you do not have HIV, but are at risk, it may be recommended that you take a prescription medicine daily to prevent HIV infection. This is called pre-exposure prophylaxis (PrEP). You are considered at risk if: ? You are sexually active and do not regularly use condoms or know the HIV status of your  partner(s). ? You take drugs by injection. ? You are sexually active with a partner who has HIV.  Talk with your health care provider about whether you are at high risk of being infected with HIV. If you choose to begin PrEP, you should first be tested for HIV. You should then be tested every 3 months for as long as you are taking PrEP. Pregnancy  If you are premenopausal and you may become  pregnant, ask your health care provider about preconception counseling.  If you may become pregnant, take 400 to 800 micrograms (mcg) of folic acid every day.  If you want to prevent pregnancy, talk to your health care provider about birth control (contraception). Osteoporosis and menopause  Osteoporosis is a disease in which the bones lose minerals and strength with aging. This can result in serious bone fractures. Your risk for osteoporosis can be identified using a bone density scan.  If you are 72 years of age or older, or if you are at risk for osteoporosis and fractures, ask your health care provider if you should be screened.  Ask your health care provider whether you should take a calcium or vitamin D supplement to lower your risk for osteoporosis.  Menopause may have certain physical symptoms and risks.  Hormone replacement therapy may reduce some of these symptoms and risks. Talk to your health care provider about whether hormone replacement therapy is right for you. Follow these instructions at home:  Schedule regular health, dental, and eye exams.  Stay current with your immunizations.  Do not use any tobacco products including cigarettes, chewing tobacco, or electronic cigarettes.  If you are pregnant, do not drink alcohol.  If you are breastfeeding, limit how much and how often you drink alcohol.  Limit alcohol intake to no more than 1 drink per day for nonpregnant women. One drink equals 12 ounces of beer, 5 ounces of wine, or 1 ounces of hard liquor.  Do not use street  drugs.  Do not share needles.  Ask your health care provider for help if you need support or information about quitting drugs.  Tell your health care provider if you often feel depressed.  Tell your health care provider if you have ever been abused or do not feel safe at home. This information is not intended to replace advice given to you by your health care provider. Make sure you discuss any questions you have with your health care provider. Document Released: 01/09/2011 Document Revised: 12/02/2015 Document Reviewed: 03/30/2015 Elsevier Interactive Patient Education  [employer].

## 2017-05-23 MED FILL — NAPROXEN SODIUM 550 MG TAB: 550 | 10 days supply | Qty: 20 | Fill #0 | Status: TO

## 2017-05-23 MED FILL — TOPIRAMATE 25 MG TAB: 25 | 30 days supply | Qty: 90 | Fill #1

## 2017-06-26 MED FILL — TOPIRAMATE 25 MG TAB: 25 | 30 days supply | Qty: 90 | Fill #2

## 2017-06-27 DIAGNOSIS — Q282 Arteriovenous malformation of cerebral vessels: Secondary | ICD-10-CM | POA: Diagnosis not present

## 2017-06-27 DIAGNOSIS — G9389 Other specified disorders of brain: Secondary | ICD-10-CM | POA: Diagnosis not present

## 2017-07-21 ENCOUNTER — Encounter (HOSPITAL_COMMUNITY): Payer: Self-pay | Admitting: Emergency Medicine

## 2017-07-21 ENCOUNTER — Other Ambulatory Visit: Payer: Self-pay

## 2017-07-21 ENCOUNTER — Ambulatory Visit (HOSPITAL_COMMUNITY)
Admission: EM | Admit: 2017-07-21 | Discharge: 2017-07-21 | Disposition: A | Discharge status: Home / Self Care | Payer: 59 | Attending: Family Medicine | Admitting: Family Medicine

## 2017-07-21 DIAGNOSIS — R69 Illness, unspecified: Principal | ICD-10-CM

## 2017-07-21 DIAGNOSIS — J029 Acute pharyngitis, unspecified: Secondary | ICD-10-CM

## 2017-07-21 DIAGNOSIS — R05 Cough: Secondary | ICD-10-CM

## 2017-07-21 DIAGNOSIS — R6883 Chills (without fever): Secondary | ICD-10-CM

## 2017-07-21 DIAGNOSIS — R0981 Nasal congestion: Secondary | ICD-10-CM | POA: Diagnosis not present

## 2017-07-21 DIAGNOSIS — J111 Influenza due to unidentified influenza virus with other respiratory manifestations: Secondary | ICD-10-CM

## 2017-07-21 DIAGNOSIS — R51 Headache: Secondary | ICD-10-CM | POA: Diagnosis not present

## 2017-07-21 MED ORDER — HYDROCODONE-HOMATROPINE 5-1.5 MG/5ML PO SYRP: 5.0000 mL | ORAL_SOLUTION | Freq: Four times a day (QID) | ORAL | 0 refills | Status: DC | PRN

## 2017-07-21 MED ORDER — OSELTAMIVIR PHOSPHATE 75 MG PO CAPS: 75.0000 mg | ORAL_CAPSULE | Freq: Two times a day (BID) | ORAL | 0 refills | Status: AC

## 2017-07-21 NOTE — ED Provider Notes (Signed)
[facility]    CSN: [id number] Arrival date & time: 07/21/17  1408     History   Chief Complaint Chief Complaint  Patient presents with  . URI    HPI Eliabeth Albertine Grates is a 32 y.o. female.   32 year old female, presenting today complaining of flulike symptoms.  Patient states that she has had a headache, generalized body aches, chills, decreased appetite, nasal congestion and sore throat that started last night.  States that 2 days ago, her son tested positive for influenza.  She has been afebrile since the onset of her symptoms.   The history is provided by the patient.  URI  Presenting symptoms: congestion, cough, fatigue, rhinorrhea and sore throat   Presenting symptoms: no ear pain, no facial pain and no fever   Severity:  Moderate Onset quality:  Gradual Duration:  2 days Timing:  Constant Progression:  Unchanged Chronicity:  New Relieved by:  Nothing Worsened by:  Nothing Ineffective treatments:  None tried Associated symptoms: myalgias   Associated symptoms: no arthralgias, no headaches, no neck pain, no sinus pain, no sneezing and no swollen glands   Risk factors: recent travel and sick contacts   Risk factors: not elderly, no chronic cardiac disease, no chronic kidney disease, no chronic respiratory disease, no diabetes mellitus, no immunosuppression and no recent illness     Past Medical History:  Diagnosis Date  . Allergic rhinitis   . Migraines   . Seizures (HCC)    last event 08/24/12    Patient Active Problem List   Diagnosis Date Noted  . Nondisplaced fracture of first metatarsal bone, right foot, initial encounter for closed fracture 01/31/2017  . Preventative health care 08/05/2015  . Frequent headaches 08/03/2014  . Allergic rhinitis 08/03/2014  . Seizures (HCC) 10/08/2012  . 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 BRAIN AVM REPAIR  12/2012   gamma radio knife @  [facility]  . CESAREAN SECTION  2009  . CESAREAN SECTION N/A 08/24/2012   Procedure: CESAREAN SECTION;  Surgeon: Kathreen Cosier, MD;  Location: [facility];  Service: Obstetrics;  Laterality: N/A;  . DILATION AND EVACUATION  09/20/2011   Procedure: DILATATION AND EVACUATION;  Surgeon: Brock Bad, MD;  Location: [facility];  Service: Gynecology;  Laterality: N/A;  . TOOTH EXTRACTION  2012    OB History    Gravida Para Term Preterm AB Living   3 2 2   1 2    SAB TAB Ectopic Multiple Live Births   1       2       Home Medications    Prior to Admission medications   Medication Sig Start Date End Date Taking? Authorizing Provider  topiramate (TOPAMAX) 25 MG tablet Take 75 mg by mouth daily.    Yes [provider]  albuterol (PROVENTIL HFA;VENTOLIN HFA) 108 (90 Base) MCG/ACT inhaler Inhale 2 puffs into the lungs every 6 (six) hours as needed for wheezing or shortness of breath. 01/10/16   Myrlene Broker, MD  Diclofenac Potassium (CAMBIA) 50 MG PACK Take by mouth. PRN for migraines.    [provider]  fluticasone (FLONASE) 50 MCG/ACT nasal spray Place 2 sprays into both nostrils daily. 09/10/14   Waldon Merl, PA-C  HYDROcodone-homatropine [facility]) 5-1.5 MG/5ML syrup Take 5 mLs by mouth every 6 (six) hours as needed for cough. 07/21/17   Wanell Lorenzi, Marylene Land, PA-C  levonorgestrel (MIRENA) 20 MCG/24HR  IUD 1 Intra Uterine Device (1 each total) by Intrauterine route once. 02/21/13   Newt Lukes, MD  naproxen (NAPROSYN) 250 MG tablet Take by mouth 2 (two) times daily with a meal.    [provider]  ondansetron (ZOFRAN) 4 MG tablet Take 4 mg by mouth every 8 (eight) hours as needed for nausea or vomiting.    [provider]  oseltamivir (TAMIFLU) 75 MG capsule Take 1 capsule (75 mg total) by mouth every 12 (twelve) hours for 5 days. 07/21/17 07/26/17  Rettie Laird, Marylene Land, PA-C  Prenatal Vit-Fe Fumarate-FA (PNV PRENATAL PLUS MULTIVITAMIN) 27-1 MG TABS Take 1  tablet by mouth daily before breakfast. 01/29/17   Brock Bad, MD  terconazole (TERAZOL 3) 0.8 % vaginal cream Place 1 applicator vaginally at bedtime. 02/16/17   Brock Bad, MD  Vitamin D, Ergocalciferol, (DRISDOL) 50000 units CAPS capsule Take 1 capsule (50,000 Units total) by mouth every 7 (seven) days. 01/31/17   Judi Saa, DO    Family History Family History  Problem Relation Age of Onset  . Hyperlipidemia Mother   . Lung cancer Maternal Grandmother 51  . Hypertension Maternal Aunt   . Diabetes Cousin     Social History Social History   Tobacco Use  . Smoking status: Never Smoker  . Smokeless tobacco: Never Used  Substance Use Topics  . Alcohol use: No    Alcohol/week: 0.0 oz  . Drug use: No     Allergies   Banana and Other   Review of Systems Review of Systems  Constitutional: Positive for chills and fatigue. Negative for fever.  HENT: Positive for congestion, rhinorrhea and sore throat. Negative for ear pain, sinus pain and sneezing.   Eyes: Negative for pain and visual disturbance.  Respiratory: Positive for cough. Negative for shortness of breath.   Cardiovascular: Negative for chest pain and palpitations.  Gastrointestinal: Negative for abdominal pain and vomiting.  Genitourinary: Negative for dysuria and hematuria.  Musculoskeletal: Positive for myalgias. Negative for arthralgias, back pain and neck pain.  Skin: Negative for color change and rash.  Neurological: Negative for seizures, syncope and headaches.  All other systems reviewed and are negative.    Physical Exam Triage Vital Signs ED Triage Vitals  Enc Vitals Group     BP 07/21/17 1448 111/77     Pulse Rate 07/21/17 1448 99     Resp 07/21/17 1448 16     Temp 07/21/17 1448 98.9 F (37.2 C)     Temp Source 07/21/17 1448 Oral     SpO2 07/21/17 1448 100 %     Weight --      Height --      Head Circumference --      Peak Flow --      Pain Score 07/21/17 1504 4     Pain Loc  --      Pain Edu? --      Excl. in GC? --    No data found.  Updated Vital Signs BP 111/77 (BP Location: Right Arm)   Pulse 99   Temp 98.9 F (37.2 C) (Oral)   Resp 16   SpO2 100%   Visual Acuity Right Eye Distance:   Left Eye Distance:   Bilateral Distance:    Right Eye Near:   Left Eye Near:    Bilateral Near:     Physical Exam  Constitutional: She appears well-developed and well-nourished. No distress.  HENT:  Head: Normocephalic and atraumatic.  Right Ear: Hearing, tympanic membrane, external ear and ear canal normal.  Left Ear: Hearing, tympanic membrane, external ear and ear canal normal.  Nose: Nose normal.  Mouth/Throat: Oropharynx is clear and moist. No oropharyngeal exudate, posterior oropharyngeal edema, posterior oropharyngeal erythema or tonsillar abscesses.  Eyes: Conjunctivae are normal.  Neck: Neck supple.  Cardiovascular: Normal rate and regular rhythm.  No murmur heard. Pulmonary/Chest: Effort normal and breath sounds normal. No stridor. No respiratory distress. She has no decreased breath sounds. She has no wheezes. She has no rhonchi. She has no rales.  Abdominal: Soft. There is no tenderness.  Musculoskeletal: She exhibits no edema.  Neurological: She is alert.  Skin: Skin is warm and dry.  Psychiatric: She has a normal mood and affect.  Nursing note and vitals reviewed.    UC Treatments / Results  Labs (all labs ordered are listed, but only abnormal results are displayed) Labs Reviewed - No data to display  EKG  EKG Interpretation None       Radiology No results found.  Procedures Procedures (including critical care time)  Medications Ordered in UC Medications - No data to display   Initial Impression / Assessment and Plan / UC Course  I have reviewed the triage vital signs and the nursing notes.  Pertinent labs & imaging results that were available during my care of the patient were reviewed by me and considered in my  medical decision making (see chart for details).     Flulike symptoms.  Son tested +2 days ago for the.  Offered Tamiflu.  Final Clinical Impressions(s) / UC Diagnoses   Final diagnoses:  Influenza-like illness    ED Discharge Orders        Ordered    oseltamivir (TAMIFLU) 75 MG capsule  Every 12 hours     07/21/17 1516    HYDROcodone-homatropine (HYCODAN) 5-1.5 MG/5ML syrup  Every 6 hours PRN     07/21/17 1516       Controlled Substance Prescriptions [place] Controlled Substance Registry consulted? Not Applicable   Alecia LemmingBlue, Winona Sison C, [place]PA-C 07/21/17 1530

## 2017-07-21 NOTE — ED Triage Notes (Signed)
Onset of feeling bad started yesterday.  Patient complains of cough, chest congestion, body aches, sore throat and headache.  Denies fever.    Son was diagnosed with the flu this week-son was tested

## 2017-07-30 MED FILL — TOPIRAMATE 25 MG TAB: 25 | 30 days supply | Qty: 90 | Fill #3

## 2017-09-07 MED FILL — TOPIRAMATE 25 MG TABLET: 25 | 30 days supply | Qty: 90 | Fill #4

## 2017-10-04 DIAGNOSIS — Q282 Arteriovenous malformation of cerebral vessels: Secondary | ICD-10-CM | POA: Diagnosis not present

## 2017-10-04 DIAGNOSIS — G43009 Migraine without aura, not intractable, without status migrainosus: Secondary | ICD-10-CM | POA: Diagnosis not present

## 2017-10-18 MED FILL — CAMBIA 50 MG POWDER PACKET: 50 | 30 days supply | Qty: 9 | Fill #1

## 2017-11-06 ENCOUNTER — Encounter: Payer: Self-pay | Admitting: *Deleted

## 2017-11-06 ENCOUNTER — Ambulatory Visit: Payer: 59 | Admitting: Obstetrics

## 2017-11-06 ENCOUNTER — Ambulatory Visit (INDEPENDENT_AMBULATORY_CARE_PROVIDER_SITE_OTHER): Payer: 59 | Admitting: Obstetrics

## 2017-11-06 ENCOUNTER — Encounter: Payer: Self-pay | Admitting: Obstetrics

## 2017-11-06 VITALS — BP 98/64 | HR 98 | Wt 137.9 lb

## 2017-11-06 DIAGNOSIS — N898 Other specified noninflammatory disorders of vagina: Secondary | ICD-10-CM

## 2017-11-06 DIAGNOSIS — Z30432 Encounter for removal of intrauterine contraceptive device: Secondary | ICD-10-CM | POA: Diagnosis not present

## 2017-11-06 NOTE — Progress Notes (Signed)
    GYNECOLOGY OFFICE PROCEDURE NOTE  Sherry Sheppard is a 32 y.o. [id number] here for Liletta IUD removal. No GYN concerns.  Last pap smear was on 01-29-2017 and was normal.  IUD Removal  Patient identified, informed consent performed, consent signed.  Patient was in the dorsal lithotomy position, normal external genitalia was noted.  A speculum was placed in the patient's vagina, normal discharge was noted, no lesions. The cervix was visualized, no lesions, no abnormal discharge.  The strings of the IUD were grasped and pulled using ring forceps. The IUD was removed in its entirety.  Patient tolerated the procedure well.    Patient will use condoms for contraception.  Routine preventative health maintenance measures emphasized.   Brock Bad , MD, FACOG Obstetrician & Gynecologist, [facility] for [facility], [facility] 11-06-2017

## 2017-11-06 NOTE — Progress Notes (Signed)
Pt states that she only wants IUD removal today, because she goes back to see Neuro in 2 months and finds out if it is safe for her to get pregnant.

## 2017-11-06 NOTE — Addendum Note (Signed)
Addended by: Natale Milch D on: 11/06/2017 04:44 PM   Modules accepted: Orders

## 2017-11-08 LAB — CERVICOVAGINAL ANCILLARY ONLY
Bacterial vaginitis: NEGATIVE
CANDIDA VAGINITIS: NEGATIVE

## 2017-12-10 ENCOUNTER — Encounter: Payer: Self-pay | Admitting: Family Medicine

## 2017-12-10 ENCOUNTER — Ambulatory Visit (INDEPENDENT_AMBULATORY_CARE_PROVIDER_SITE_OTHER): Payer: Self-pay | Admitting: Family Medicine

## 2017-12-10 VITALS — BP 110/70 | HR 112 | Temp 100.6°F | Wt 138.8 lb

## 2017-12-10 DIAGNOSIS — R0981 Nasal congestion: Secondary | ICD-10-CM

## 2017-12-10 DIAGNOSIS — J309 Allergic rhinitis, unspecified: Secondary | ICD-10-CM

## 2017-12-10 DIAGNOSIS — J029 Acute pharyngitis, unspecified: Secondary | ICD-10-CM

## 2017-12-10 LAB — POCT RAPID STREP A (OFFICE): RAPID STREP A SCREEN: NEGATIVE

## 2017-12-10 MED ORDER — IPRATROPIUM BROMIDE 0.06 % NA SOLN: 2.0000 | Freq: Four times a day (QID) | NASAL | 12 refills | Status: DC

## 2017-12-10 MED ORDER — FLUTICASONE PROPIONATE 50 MCG/ACT NA SUSP: 2.0000 | Freq: Every day | NASAL | 6 refills | Status: DC

## 2017-12-10 MED FILL — FLUTICASONE PROP 50 MCG SPR: 50 | 30 days supply | Qty: 16 | Fill #0

## 2017-12-10 MED FILL — IPRATROPIUM 0.06% SPRAY: 0.06 | 11 days supply | Qty: 15 | Fill #0

## 2017-12-10 NOTE — Progress Notes (Signed)
Error

## 2017-12-10 NOTE — Progress Notes (Signed)
Sherry Sheppard is a 32 y.o. female who presents today with concerns of sore throat for 3 days and new onset fever. She reports known sick contacts and travels frequently to healthcare provider offices.  Review of Systems  Constitutional: Negative for chills, fever and malaise/fatigue.  HENT: Positive for congestion, sinus pain and sore throat. Negative for ear discharge and ear pain.   Eyes: Negative.   Respiratory: Negative for cough, sputum production and shortness of breath.   Cardiovascular: Negative.  Negative for chest pain.  Gastrointestinal: Negative for abdominal pain, diarrhea, nausea and vomiting.  Genitourinary: Negative for dysuria, frequency, hematuria and urgency.  Musculoskeletal: Negative for myalgias.  Skin: Negative.   Neurological: Negative for headaches.  Endo/Heme/Allergies: Negative.   Psychiatric/Behavioral: Negative.     O: Vitals:   12/10/17 1504  BP: 110/70  Pulse: (!) 112  Temp: (!) 100.6 F (38.1 C)  SpO2: 98%     Physical Exam  Constitutional: She is oriented to person, place, and time. Vital signs are normal. She appears well-developed and well-nourished. She is active.  Non-toxic appearance. She does not have a sickly appearance.  HENT:  Head: Normocephalic.  Right Ear: Hearing, tympanic membrane, external ear and ear canal normal.  Left Ear: Hearing, tympanic membrane, external ear and ear canal normal.  Nose: Mucosal edema and rhinorrhea present. Right sinus exhibits frontal sinus tenderness. Left sinus exhibits frontal sinus tenderness.  Mouth/Throat: Uvula is midline and mucous membranes are normal. Posterior oropharyngeal erythema present. Tonsils are 2+ on the right. Tonsils are 2+ on the left. No tonsillar exudate.  Neck: Normal range of motion. Neck supple.  Cardiovascular: Normal rate, regular rhythm, normal heart sounds and normal pulses.  Pulmonary/Chest: Effort normal and breath sounds normal.  Abdominal: Soft. Bowel sounds are  normal.  Musculoskeletal: Normal range of motion.  Lymphadenopathy:       Head (right side): No submental and no submandibular adenopathy present.       Head (left side): No submental and no submandibular adenopathy present.    She has no cervical adenopathy.  Neurological: She is alert and oriented to person, place, and time.  Psychiatric: She has a normal mood and affect.  Vitals reviewed.    A: 1. Upper respiratory tract infection, unspecified type   2. Sore throat   3. Nasal congestion   4. Allergic rhinitis, unspecified seasonality, unspecified trigger      P: Tylenol 650 mg every 6-8 hours as needed -or- Motrin 800 mg with food every 8 hours Work note x 48 hours Honey (tsp)  Suspect viral illness at this time- complicated by nasal congestion related to untreated seasonal allergies Exam findings, diagnosis etiology and medication use and indications reviewed with patient. Follow- Up and discharge instructions provided. No emergent/urgent issues found on exam.  Patient verbalized understanding of information provided and agrees with plan of care (POC), all questions answered.  1. Upper respiratory tract infection, unspecified type  2. Sore throat - POCT rapid strep A Results for orders placed or performed in visit on 12/10/17 (from the past 24 hour(s))  POCT rapid strep A     Status: None   Collection Time: 12/10/17  3:18 PM  Result Value Ref Range   Rapid Strep A Screen Negative Negative    3. Nasal congestion  4. Allergic rhinitis, unspecified seasonality, unspecified trigger

## 2017-12-10 NOTE — Patient Instructions (Addendum)
PLAN< Tylenol 650 mg every 6-8 hours as needed -or- Motrin 800 mg with food every 8 hours Work note x 48 hours Honey (tsp)  Allergic Rhinitis, Adult Allergic rhinitis is an allergic reaction that affects the mucous membrane inside the nose. It causes sneezing, a runny or stuffy nose, and the feeling of mucus going down the back of the throat (postnasal drip). Allergic rhinitis can be mild to severe. There are two types of allergic rhinitis:  Seasonal. This type is also called hay fever. It happens only during certain seasons.  Perennial. This type can happen at any time of the year.  What are the causes? This condition happens when the body's defense system (immune system) responds to certain harmless substances called allergens as though they were germs.  Seasonal allergic rhinitis is triggered by pollen, which can come from grasses, trees, and weeds. Perennial allergic rhinitis may be caused by:  House dust mites.  Pet dander.  Mold spores.  What are the signs or symptoms? Symptoms of this condition include:  Sneezing.  Runny or stuffy nose (nasal congestion).  Postnasal drip.  Itchy nose.  Tearing of the eyes.  Trouble sleeping.  Daytime sleepiness.  How is this diagnosed? This condition may be diagnosed based on:  Your medical history.  A physical exam.  Tests to check for related conditions, such as: ? Asthma. ? Pink eye. ? Ear infection. ? Upper respiratory infection.  Tests to find out which allergens trigger your symptoms. These may include skin or blood tests.  How is this treated? There is no cure for this condition, but treatment can help control symptoms. Treatment may include:  Taking medicines that block allergy symptoms, such as antihistamines. Medicine may be given as a shot, nasal spray, or pill.  Avoiding the allergen.  Desensitization. This treatment involves getting ongoing shots until your body becomes less sensitive to the  allergen. This treatment may be done if other treatments do not help.  If taking medicine and avoiding the allergen does not work, new, stronger medicines may be prescribed.  Follow these instructions at home:  Find out what you are allergic to. Common allergens include smoke, dust, and pollen.  Avoid the things you are allergic to. These are some things you can do to help avoid allergens: ? Replace carpet with wood, tile, or vinyl flooring. Carpet can trap dander and dust. ? Do not smoke. Do not allow smoking in your home. ? Change your heating and air conditioning filter at least once a month. ? During allergy season:  Keep windows closed as much as possible.  Plan outdoor activities when pollen counts are lowest. This is usually during the evening hours.  When coming indoors, change clothing and shower before sitting on furniture or bedding.  Take over-the-counter and prescription medicines only as told by your health care provider.  Keep all follow-up visits as told by your health care provider. This is important. Contact a health care provider if:  You have a fever.  You develop a persistent cough.  You make whistling sounds when you breathe (you wheeze).  Your symptoms interfere with your normal daily activities. Get help right away if:  You have shortness of breath. Summary  This condition can be managed by taking medicines as directed and avoiding allergens.  Contact your health care provider if you develop a persistent cough or fever.  During allergy season, keep windows closed as much as possible. This information is not intended to replace advice given  to you by your health care provider. Make sure you discuss any questions you have with your health care provider. Document Released: 03/21/2001 Document Revised: 08/03/2016 Document Reviewed: 08/03/2016 Elsevier Interactive Patient Education  [employer].

## 2017-12-24 DIAGNOSIS — Q282 Arteriovenous malformation of cerebral vessels: Secondary | ICD-10-CM | POA: Diagnosis not present

## 2017-12-24 DIAGNOSIS — Q2730 Arteriovenous malformation, site unspecified: Secondary | ICD-10-CM | POA: Diagnosis not present

## 2017-12-24 DIAGNOSIS — G43909 Migraine, unspecified, not intractable, without status migrainosus: Secondary | ICD-10-CM | POA: Diagnosis not present

## 2017-12-25 MED FILL — PRENATAL VITAMIN PLUS LOW I: 27-1 | 30 days supply | Qty: 30 | Fill #1

## 2018-01-23 ENCOUNTER — Encounter: Payer: Self-pay | Admitting: Obstetrics

## 2018-01-24 MED FILL — PRENATAL VITAMIN PLUS LOW I: 27-1 | 30 days supply | Qty: 30 | Fill #2

## 2018-01-24 MED FILL — NAPROXEN SODIUM 550 MG TAB: 550 | 10 days supply | Qty: 20 | Fill #0

## 2018-01-29 ENCOUNTER — Encounter

## 2018-02-22 ENCOUNTER — Ambulatory Visit (INDEPENDENT_AMBULATORY_CARE_PROVIDER_SITE_OTHER): Payer: 59 | Admitting: Obstetrics

## 2018-02-22 ENCOUNTER — Encounter: Payer: Self-pay | Admitting: Obstetrics

## 2018-02-22 ENCOUNTER — Other Ambulatory Visit: Payer: Self-pay | Admitting: Obstetrics

## 2018-02-22 VITALS — BP 105/71 | HR 78 | Ht 65.0 in | Wt 152.1 lb

## 2018-02-22 DIAGNOSIS — Z0000 Encounter for general adult medical examination without abnormal findings: Secondary | ICD-10-CM

## 2018-02-22 DIAGNOSIS — Z124 Encounter for screening for malignant neoplasm of cervix: Secondary | ICD-10-CM | POA: Diagnosis not present

## 2018-02-22 DIAGNOSIS — N926 Irregular menstruation, unspecified: Secondary | ICD-10-CM

## 2018-02-22 DIAGNOSIS — Z01419 Encounter for gynecological examination (general) (routine) without abnormal findings: Secondary | ICD-10-CM

## 2018-02-22 DIAGNOSIS — N898 Other specified noninflammatory disorders of vagina: Secondary | ICD-10-CM | POA: Diagnosis not present

## 2018-02-22 DIAGNOSIS — Z3169 Encounter for other general counseling and advice on procreation: Secondary | ICD-10-CM

## 2018-02-22 DIAGNOSIS — Z113 Encounter for screening for infections with a predominantly sexual mode of transmission: Secondary | ICD-10-CM

## 2018-02-22 DIAGNOSIS — Z1151 Encounter for screening for human papillomavirus (HPV): Secondary | ICD-10-CM

## 2018-02-22 NOTE — Progress Notes (Signed)
Subjective:        Sherry Sheppard is a 32 y.o. female here for a routine exam.  Current complaints: Cycles a little irregular since removing IUD.  She is trying to conceive.    Personal health questionnaire:  Is patient [name], have a family history of breast and/or ovarian cancer: no Is there a family history of uterine cancer diagnosed at age < 7350, gastrointestinal cancer, urinary tract cancer, family member who is a Personnel officerLynch syndrome-associated carrier: no Is the patient overweight and hypertensive, family history of diabetes, personal history of gestational diabetes, preeclampsia or PCOS: no Is patient over 5655, have PCOS,  family history of premature CHD under age 31, diabetes, smoke, have hypertension or peripheral artery disease:  no At any time, has a partner hit, kicked or otherwise hurt or frightened you?: no Over the past 2 weeks, have you felt down, depressed or hopeless?: no Over the past 2 weeks, have you felt little interest or pleasure in doing things?:no   Gynecologic History Patient's last menstrual period was 02/17/2018. Contraception: none Last Pap: 2018. Results were: normal Last mammogram: n/a. Results were: n/a  Obstetric History OB History  Gravida Para Term Preterm AB Living  3 2 2   1 2   SAB TAB Ectopic Multiple Live Births  1       2    # Outcome Date GA Lbr Len/2nd Weight Sex Delivery Anes PTL Lv  3 Term 08/24/12 [id number]  8 lb 0.8 oz (3.65 kg) M CS-Vac Spinal  LIV  2 SAB 2013             Birth Comments: System Generated. Please review and update pregnancy details.  1 Term 04/07/08    M CS-LTranv   LIV     Birth Comments: c/S because FTP and fetal distress    Past Medical History:  Diagnosis Date  . Allergic rhinitis   . Migraines   . Seizures (HCC)    last event 08/24/12    Past Surgical History:  Procedure Laterality Date  . 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 CESAREAN SECTION  2009  . CESAREAN SECTION  N/A 08/24/2012   Procedure: CESAREAN SECTION;  Surgeon: Kathreen CosierBernard A Marshall, MD;  Location: [facility];  Service: Obstetrics;  Laterality: N/A;  . DILATION AND EVACUATION  09/20/2011   Procedure: DILATATION AND EVACUATION;  Surgeon: Brock Badharles A Harper, MD;  Location: [facility];  Service: Gynecology;  Laterality: N/A;  . TOOTH EXTRACTION  2012     Current Outpatient Medications:  .  fluticasone (FLONASE) 50 MCG/ACT nasal spray, Place 2 sprays into both nostrils daily., Disp: 16 g, Rfl: 6 .  naproxen (NAPROSYN) 250 MG tablet, Take by mouth 2 (two) times daily with a meal., Disp: , Rfl:  .  Prenatal Vit-Fe Fumarate-FA (PNV PRENATAL PLUS MULTIVITAMIN) 27-1 MG TABS, Take 1 tablet by mouth daily before breakfast., Disp: 30 tablet, Rfl: 11 .  albuterol (PROVENTIL HFA;VENTOLIN HFA) 108 (90 Base) MCG/ACT inhaler, Inhale 2 puffs into the lungs every 6 (six) hours as needed for wheezing or shortness of breath. (Patient not taking: Reported on 11/06/2017), Disp: 1 Inhaler, Rfl: 0 .  Diclofenac Potassium (CAMBIA) 50 MG PACK, Take by mouth. PRN for migraines., Disp: , Rfl:  .  fluticasone (FLONASE) 50 MCG/ACT nasal spray, Place 2 sprays into both nostrils daily., Disp: 16 g, Rfl: 6 .  HYDROcodone-homatropine (HYCODAN) 5-1.5 MG/5ML syrup, Take 5 mLs by mouth  every 6 (six) hours as needed for cough. (Patient not taking: Reported on 11/06/2017), Disp: 120 mL, Rfl: 0 .  ipratropium (ATROVENT) 0.06 % nasal spray, Place 2 sprays into both nostrils 4 (four) times daily. (Patient not taking: Reported on 02/22/2018), Disp: 15 mL, Rfl: 12 .  levonorgestrel (MIRENA) 20 MCG/24HR IUD, 1 Intra Uterine Device (1 each total) by Intrauterine route once., Disp: 1 each, Rfl: 0 .  ondansetron (ZOFRAN) 4 MG tablet, Take 4 mg by mouth every 8 (eight) hours as needed for nausea or vomiting., Disp: , Rfl:  .  terconazole (TERAZOL 3) 0.8 % vaginal cream, Place 1 applicator vaginally at bedtime. (Patient not taking: Reported on 11/06/2017), Disp: 20 g,  Rfl: 2 .  topiramate (TOPAMAX) 25 MG tablet, Take 75 mg by mouth daily. , Disp: , Rfl:  .  Vitamin D, Ergocalciferol, (DRISDOL) 50000 units CAPS capsule, Take 1 capsule (50,000 Units total) by mouth every 7 (seven) days. (Patient not taking: Reported on 11/06/2017), Disp: 12 capsule, Rfl: 0 Allergies  Allergen Reactions  . Banana Shortness Of Breath  . Other Nausea And Vomiting    Contrast dye    Social History   Tobacco Use  . Smoking status: Never Smoker  . Smokeless tobacco: Never Used  Substance Use Topics  . Alcohol use: No    Alcohol/week: 0.0 standard drinks    Family History  Problem Relation Age of Onset  . Hyperlipidemia Mother   . Diabetes Mother   . Hypertension Father   . Lung cancer Maternal Grandmother 36  . Hypertension Maternal Aunt   . Diabetes Cousin       Review of Systems  Constitutional: negative for fatigue and weight loss Respiratory: negative for cough and wheezing Cardiovascular: negative for chest pain, fatigue and palpitations Gastrointestinal: negative for abdominal pain and change in bowel habits Musculoskeletal:negative for myalgias Neurological: negative for gait problems and tremors Behavioral/Psych: negative for abusive relationship, depression Endocrine: negative for temperature intolerance    Genitourinary:negative for abnormal menstrual periods, genital lesions, hot flashes, sexual problems and vaginal discharge Integument/breast: negative for breast lump, breast tenderness, nipple discharge and skin lesion(s)    Objective:       BP 105/71   Pulse 78   Ht 5\' 5"  (1.651 m)   Wt 152 lb 1.6 oz (69 kg)   LMP 02/17/2018   BMI 25.31 kg/m  General:   alert  Skin:   no rash or abnormalities  Lungs:   clear to auscultation bilaterally  Heart:   regular rate and rhythm, S1, S2 normal, no murmur, click, rub or gallop  Breasts:   normal without suspicious masses, skin or nipple changes or axillary nodes  Abdomen:  normal findings: no  organomegaly, soft, non-tender and no hernia  Pelvis:  External genitalia: normal general appearance Urinary system: urethral meatus normal and bladder without fullness, nontender Vaginal: normal without tenderness, induration or masses Cervix: normal appearance Adnexa: normal bimanual exam Uterus: anteverted and non-tender, normal size   Lab Review Urine pregnancy test Labs reviewed yes Radiologic studies reviewed no  50% of 20 min visit spent on counseling and coordination of care.   Assessment:     1. Encounter for routine gynecological examination with Papanicolaou smear of cervix Rx: - Cytology - PAP  2. Vaginal discharge Rx: - Cervicovaginal ancillary only  3. Screening for STD (sexually transmitted disease) Rx: - Hepatitis B surface antigen - Hepatitis C antibody - HIV antibody - RPR  4. Encounter for preconception consultation -  folic acid recommended  5. Menstrual periods irregular - will follow clinically for now     Plan:    Education reviewed: calcium supplements, depression evaluation, low fat, low cholesterol diet, safe sex/STD prevention, self breast exams and weight bearing exercise. Contraception: none. Follow up in: 1 year.   No orders of the defined types were placed in this encounter.  Orders Placed This Encounter  Procedures  . Hepatitis B surface antigen  . Hepatitis C antibody  . HIV antibody  . RPR    Brock BadHARLES A. HARPER MD 02-22-2018

## 2018-02-22 NOTE — Progress Notes (Signed)
Pt presents for annual, pap, and all STD blood work. Pt c/o irregular cycles. Sometimes it's 4 days, sometimes 2 days, sometimes it's late. Recently removed IUD in April; trying to conceive.

## 2018-02-23 LAB — HEPATITIS B SURFACE ANTIGEN: Hepatitis B Surface Ag: NEGATIVE

## 2018-02-23 LAB — HEPATITIS C ANTIBODY

## 2018-02-23 LAB — RPR: RPR Ser Ql: NONREACTIVE

## 2018-02-23 LAB — HIV ANTIBODY (ROUTINE TESTING W REFLEX): HIV Screen 4th Generation wRfx: NONREACTIVE

## 2018-02-25 MED FILL — PRENATAL VITAMIN PLUS LOW I: 27-1 | 30 days supply | Qty: 30 | Fill #0

## 2018-02-26 LAB — CERVICOVAGINAL ANCILLARY ONLY
BACTERIAL VAGINITIS: NEGATIVE
Candida vaginitis: NEGATIVE
Chlamydia: NEGATIVE
Neisseria Gonorrhea: NEGATIVE
Trichomonas: NEGATIVE

## 2018-02-26 LAB — CYTOLOGY - PAP
Diagnosis: NEGATIVE
HPV (WINDOPATH): NOT DETECTED

## 2018-03-22 ENCOUNTER — Ambulatory Visit (INDEPENDENT_AMBULATORY_CARE_PROVIDER_SITE_OTHER): Payer: 59

## 2018-03-22 DIAGNOSIS — Z3480 Encounter for supervision of other normal pregnancy, unspecified trimester: Secondary | ICD-10-CM

## 2018-03-22 DIAGNOSIS — Z3201 Encounter for pregnancy test, result positive: Secondary | ICD-10-CM

## 2018-03-22 LAB — POCT URINE PREGNANCY: Preg Test, Ur: POSITIVE — AB

## 2018-03-22 NOTE — Progress Notes (Signed)
Pt is here for UPT nurse visit. UPT positive. Pt states LMP 02/17/18 which gives her a due date of 11/24/18. Instructed pt to make initial OB appt around the week of 04/28/18 when she will be [redacted] weeks GA. Also instructed pt to call with any questions or concerns she might have before the initial OB appt. Pt verbalized understanding. Prenatal vitamin samples given and pt instructed to go ahead and start taking them.

## 2018-03-26 ENCOUNTER — Other Ambulatory Visit: Payer: Self-pay | Admitting: Obstetrics

## 2018-03-26 DIAGNOSIS — B3731 Acute candidiasis of vulva and vagina: Secondary | ICD-10-CM

## 2018-03-26 DIAGNOSIS — Z3490 Encounter for supervision of normal pregnancy, unspecified, unspecified trimester: Secondary | ICD-10-CM

## 2018-03-26 DIAGNOSIS — B373 Candidiasis of vulva and vagina: Secondary | ICD-10-CM

## 2018-03-26 MED ORDER — TERCONAZOLE 0.8 % VA CREA: 1.0000 | TOPICAL_CREAM | Freq: Every day | VAGINAL | 0 refills | Status: DC

## 2018-03-26 MED ORDER — CITRANATAL BLOOM 90-1 MG PO TABS: 1.0000 | ORAL_TABLET | Freq: Every day | ORAL | 4 refills | Status: DC

## 2018-03-26 MED FILL — TERCONAZOLE 0.8% VAGINAL CR: 0.8 | 3 days supply | Qty: 20 | Fill #0

## 2018-03-29 ENCOUNTER — Other Ambulatory Visit: Payer: Self-pay | Admitting: Obstetrics

## 2018-03-29 DIAGNOSIS — O219 Vomiting of pregnancy, unspecified: Secondary | ICD-10-CM

## 2018-03-29 MED ORDER — PROMETHAZINE HCL 25 MG PO TABS: 25.0000 mg | ORAL_TABLET | Freq: Four times a day (QID) | ORAL | 2 refills | Status: DC | PRN

## 2018-03-29 MED FILL — PROMETHAZINE 25 MG TABLET: 25 | 7 days supply | Qty: 30 | Fill #0

## 2018-04-23 ENCOUNTER — Inpatient Hospital Stay (HOSPITAL_COMMUNITY)
Admission: AD | Admit: 2018-04-23 | Discharge: 2018-04-23 | Disposition: A | Discharge status: Home / Self Care | Payer: 59 | Source: Ambulatory Visit | Attending: Family Medicine | Admitting: Family Medicine

## 2018-04-23 ENCOUNTER — Inpatient Hospital Stay (HOSPITAL_COMMUNITY): Payer: 59

## 2018-04-23 ENCOUNTER — Encounter (HOSPITAL_COMMUNITY): Payer: Self-pay | Admitting: *Deleted

## 2018-04-23 ENCOUNTER — Telehealth: Payer: Self-pay

## 2018-04-23 DIAGNOSIS — O209 Hemorrhage in early pregnancy, unspecified: Secondary | ICD-10-CM | POA: Diagnosis not present

## 2018-04-23 DIAGNOSIS — O208 Other hemorrhage in early pregnancy: Secondary | ICD-10-CM | POA: Diagnosis not present

## 2018-04-23 DIAGNOSIS — Z3A10 10 weeks gestation of pregnancy: Secondary | ICD-10-CM | POA: Diagnosis not present

## 2018-04-23 LAB — URINALYSIS, ROUTINE W REFLEX MICROSCOPIC
Bacteria, UA: NONE SEEN
Bilirubin Urine: NEGATIVE
GLUCOSE, UA: NEGATIVE mg/dL
Ketones, ur: NEGATIVE mg/dL
Leukocytes, UA: NEGATIVE
Nitrite: NEGATIVE
PH: 9 — AB (ref 5.0–8.0)
PROTEIN: NEGATIVE mg/dL
Specific Gravity, Urine: 1.015 (ref 1.005–1.030)

## 2018-04-23 NOTE — MAU Provider Note (Signed)
History     CSN: [id number]  Arrival date and time: 04/23/18 4098   First Provider Initiated Contact with Patient 04/23/18 1710      Chief Complaint  Patient presents with  . Vaginal Bleeding   HPI This is a 32 year old G4 P2-0-1-2 at 9 weeks and 2 days by LMP.  Patient presents with vaginal bleeding: She had blood on the tissue paper when she wiped earlier today.  She denies cramping, pelvic pain, fevers, chills, abnormal vaginal discharge.  No palliating or provoking factors.  Describes vaginal bleeding a small.  OB History    Gravida  4   Para  2   Term  2   Preterm      AB  1   Living  2     SAB  1   TAB      Ectopic      Multiple      Live Births  2           Past Medical History:  Diagnosis Date  . Allergic rhinitis   . Migraines   . Seizures (HCC)    last event 08/24/12    Past Surgical History:  Procedure Laterality Date  . 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 CESAREAN SECTION  2009  . CESAREAN SECTION N/A 08/24/2012   Procedure: CESAREAN SECTION;  Surgeon: Kathreen Cosier, MD;  Location: [facility];  Service: Obstetrics;  Laterality: N/A;  . DILATION AND EVACUATION  09/20/2011   Procedure: DILATATION AND EVACUATION;  Surgeon: Brock Bad, MD;  Location: [facility];  Service: Gynecology;  Laterality: N/A;  . TOOTH EXTRACTION  2012    Family History  Problem Relation Age of Onset  . Hyperlipidemia Mother   . Diabetes Mother   . Hypertension Father   . Lung cancer Maternal Grandmother 41  . Hypertension Maternal Aunt   . Diabetes Cousin     Social History   Tobacco Use  . Smoking status: Never Smoker  . Smokeless tobacco: Never Used  Substance Use Topics  . Alcohol use: No    Alcohol/week: 0.0 standard drinks  . Drug use: No    Allergies:  Allergies  Allergen Reactions  . Banana Shortness Of Breath  . Other Nausea And Vomiting    Contrast dye    Medications Prior to Admission  Medication Sig  Dispense Refill Last Dose  . albuterol (PROVENTIL HFA;VENTOLIN HFA) 108 (90 Base) MCG/ACT inhaler Inhale 2 puffs into the lungs every 6 (six) hours as needed for wheezing or shortness of breath. (Patient not taking: Reported on 11/06/2017) 1 Inhaler 0 Not Taking  . fluticasone (FLONASE) 50 MCG/ACT nasal spray Place 2 sprays into both nostrils daily. 16 g 6   . HYDROcodone-homatropine (HYCODAN) 5-1.5 MG/5ML syrup Take 5 mLs by mouth every 6 (six) hours as needed for cough. (Patient not taking: Reported on 11/06/2017) 120 mL 0 Not Taking  . ondansetron (ZOFRAN) 4 MG tablet Take 4 mg by mouth every 8 (eight) hours as needed for nausea or vomiting.   Unknown at Unknown time  . Prenatal Vit-Fe Fumarate-FA (PRENATAL VITAMIN PLUS LOW IRON) 27-1 MG TABS TAKE 1 TABLET BY MOUTH DAILY BEFORE BREAKFAST. 30 tablet 11 Taking  . Prenatal-DSS-FeCb-FeGl-FA (CITRANATAL BLOOM) 90-1 MG TABS Take 1 tablet by mouth daily before breakfast. 90 tablet 4   . promethazine (PHENERGAN) 25 MG tablet Take 1 tablet (25 mg total) by mouth every 6 (six) hours as  needed for nausea or vomiting. 30 tablet 2   . terconazole (TERAZOL 3) 0.8 % vaginal cream Place 1 applicator vaginally at bedtime. 20 g 0   . Vitamin D, Ergocalciferol, (DRISDOL) 50000 units CAPS capsule Take 1 capsule (50,000 Units total) by mouth every 7 (seven) days. (Patient not taking: Reported on 11/06/2017) 12 capsule 0 Not Taking    Review of Systems Physical Exam   Blood pressure 116/73, pulse 93, temperature 98.4 F (36.9 C), temperature source Oral, resp. rate 18, height 5\' 5"  (1.651 m), weight 73.5 kg, last menstrual period 02/17/2018.  Physical Exam  Constitutional: She is oriented to person, place, and time. She appears well-developed and well-nourished.  HENT:  Head: Normocephalic and atraumatic.  Right Ear: External ear normal.  Left Ear: External ear normal.  Eyes: Pupils are equal, round, and reactive to light. Conjunctivae are normal.  Neck: Normal  range of motion. Neck supple.  Cardiovascular: Normal rate, regular rhythm and normal heart sounds.  Respiratory: Effort normal and breath sounds normal.  GI: Soft. Bowel sounds are normal. She exhibits no distension. There is no tenderness. There is no rebound and no guarding.  Neurological: She is alert and oriented to person, place, and time.  Skin: Skin is warm and dry.  Psychiatric: She has a normal mood and affect. Her behavior is normal. Judgment and thought content normal.   US Ob Comp Less 14 Wks  Result Date: 04/23/2018 CLINICAL DATA:  Vaginal bleeding. Estimated gestational age of [redacted] weeks, 1 day by first ultrasound. EXAM: OBSTETRIC <14 WK ULTRASOUND TECHNIQUE: Transabdominal ultrasound was performed for evaluation of the gestation as well as the maternal uterus and adnexal regions. COMPARISON:  None. FINDINGS: Intrauterine gestational sac: Single Yolk sac:  Visualized. Embryo:  Visualized. Cardiac Activity: Visualized. Heart Rate: 176 bpm CRL:   32.83 mm   10 w 1 d                  [place] EDC: 11/18/2018 Subchorionic hemorrhage:  None visualized. Maternal uterus/adnexae: Cervix is closed. The bilateral ovaries are not visualized. IMPRESSION: 1. Single, live intrauterine pregnancy with estimated gestational age of [redacted] weeks, 1 day. No acute abnormality. Electronically Signed   By: Obie Dredge M.D.   On: 04/23/2018 18:09     MAU Course  Procedures  MDM  Assessment and Plan     ICD-10-CM   1. Vaginal bleeding affecting early pregnancy O20.8   2. [redacted] weeks gestation of pregnancy Z3A.10    No source of bleeding.  Patient reassured.  Has f/u with OB next week.  Levie Heritage 04/23/2018, 5:15 PM

## 2018-04-23 NOTE — Discharge Instructions (Signed)
Vaginal Bleeding During Pregnancy, First Trimester °A small amount of bleeding (spotting) from the vagina is common in early pregnancy. Sometimes the bleeding is normal and is not a problem, and sometimes it is a sign of something serious. Be sure to tell your doctor about any bleeding from your vagina right away. °Follow these instructions at home: °· Watch your condition for any changes. °· Follow your doctor's instructions about how active you can be. °· If you are on bed rest: °? You may need to stay in bed and only get up to use the bathroom. °? You may be allowed to do some activities. °? If you need help, make plans for someone to help you. °· Write down: °? The number of pads you use each day. °? How often you change pads. °? How soaked (saturated) your pads are. °· Do not use tampons. °· Do not douche. °· Do not have sex or orgasms until your doctor says it is okay. °· If you pass any tissue from your vagina, save the tissue so you can show it to your doctor. °· Only take medicines as told by your doctor. °· Do not take aspirin because it can make you bleed. °· Keep all follow-up visits as told by your doctor. °Contact a doctor if: °· You bleed from your vagina. °· You have cramps. °· You have labor pains. °· You have a fever that does not go away after you take medicine. °Get help right away if: °· You have very bad cramps in your back or belly (abdomen). °· You pass large clots or tissue from your vagina. °· You bleed more. °· You feel light-headed or weak. °· You pass out (faint). °· You have chills. °· You are leaking fluid or have a gush of fluid from your vagina. °· You pass out while pooping (having a bowel movement). °This information is not intended to replace advice given to you by your health care provider. Make sure you discuss any questions you have with your health care provider. °Document Released: 11/10/2013 Document Revised: 12/02/2015 Document Reviewed: 03/03/2013 °Elsevier Interactive  Patient Education © 2018 [employer] ° °

## 2018-04-23 NOTE — Telephone Encounter (Signed)
TC from pt reporting vaginal spotting in first trimester. Pt denies any heavy bleeding, not soaking a pad x 1 hr, no pain. Pt advised to go to MAU for evaluation for assurance.  Pt agreeable and voiced understanding.

## 2018-04-23 NOTE — MAU Note (Signed)
Pt went to BR 45 minutes ago, wiped, noted brown spotting, had another episode of spotting.  Called MD office, told to come to MAU.  Having some R flank pain x 2 days.

## 2018-04-29 MED FILL — PRENATAL VITAMIN PLUS LOW I: 27-1 | 90 days supply | Qty: 90 | Fill #1

## 2018-05-01 ENCOUNTER — Ambulatory Visit (INDEPENDENT_AMBULATORY_CARE_PROVIDER_SITE_OTHER): Payer: 59 | Admitting: Obstetrics

## 2018-05-01 ENCOUNTER — Other Ambulatory Visit: Payer: Self-pay

## 2018-05-01 ENCOUNTER — Encounter: Payer: Self-pay | Admitting: Obstetrics

## 2018-05-01 VITALS — BP 120/80 | HR 106 | Wt 162.6 lb

## 2018-05-01 DIAGNOSIS — N898 Other specified noninflammatory disorders of vagina: Secondary | ICD-10-CM

## 2018-05-01 DIAGNOSIS — O219 Vomiting of pregnancy, unspecified: Secondary | ICD-10-CM

## 2018-05-01 DIAGNOSIS — Z113 Encounter for screening for infections with a predominantly sexual mode of transmission: Secondary | ICD-10-CM

## 2018-05-01 DIAGNOSIS — Z3480 Encounter for supervision of other normal pregnancy, unspecified trimester: Secondary | ICD-10-CM

## 2018-05-01 DIAGNOSIS — Z3481 Encounter for supervision of other normal pregnancy, first trimester: Secondary | ICD-10-CM

## 2018-05-01 DIAGNOSIS — E559 Vitamin D deficiency, unspecified: Secondary | ICD-10-CM

## 2018-05-01 MED ORDER — DOXYLAMINE-PYRIDOXINE 10-10 MG PO TBEC: DELAYED_RELEASE_TABLET | ORAL | 5 refills | Status: DC

## 2018-05-01 MED ORDER — VITAMIN D 50 MCG (2000 UT) PO CAPS: 1.0000 | ORAL_CAPSULE | Freq: Every day | ORAL | 11 refills | Status: DC

## 2018-05-01 MED FILL — DOXYLAMINE-PYRIDOXINE 10-10: 10-10 | 25 days supply | Qty: 100 | Fill #0

## 2018-05-01 NOTE — Progress Notes (Signed)
New OB.  C/o nausea.

## 2018-05-01 NOTE — Progress Notes (Signed)
Subjective:    Sherry Sheppard is being seen today for her first obstetrical visit.  This is a planned pregnancy. She is at [redacted]w[redacted]d gestation. Her obstetrical history is significant for 17-P with last pregnancy. Relationship with FOB: spouse, living together. Patient does intend to breast feed. Pregnancy history fully reviewed.  The information documented in the HPI was reviewed and verified.  Menstrual History: OB History    Gravida  4   Para  2   Term  2   Preterm      AB  1   Living  2     SAB  1   TAB      Ectopic      Multiple      Live Births  2            Patient's last menstrual period was 02/17/2018 (exact date).    Past Medical History:  Diagnosis Date  . Allergic rhinitis   . Migraines   . Seizures (HCC)    last event 08/24/12  . Vaginal Pap smear, abnormal     Past Surgical History:  Procedure Laterality Date  . 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 CESAREAN SECTION  2009  . CESAREAN SECTION N/A 08/24/2012   Procedure: CESAREAN SECTION;  Surgeon: Kathreen Cosier, MD;  Location: [facility];  Service: Obstetrics;  Laterality: N/A;  . DILATION AND EVACUATION  09/20/2011   Procedure: DILATATION AND EVACUATION;  Surgeon: Brock Bad, MD;  Location: [facility];  Service: Gynecology;  Laterality: N/A;  . TOOTH EXTRACTION  2012     (Not in a hospital admission) Allergies  Allergen Reactions  . Banana Shortness Of Breath  . Other Nausea And Vomiting    Contrast dye    Social History   Tobacco Use  . Smoking status: Never Smoker  . Smokeless tobacco: Never Used  Substance Use Topics  . Alcohol use: No    Alcohol/week: 0.0 standard drinks    Family History  Problem Relation Age of Onset  . Hyperlipidemia Mother   . Diabetes Mother   . Hypertension Father   . Lung cancer Maternal Grandmother 59  . Hypertension Maternal Aunt   . Diabetes Cousin      Review of Systems Constitutional: negative for weight  loss Gastrointestinal: positive for nausea and constipation Genitourinary:negative for genital lesions and vaginal discharge and dysuria Musculoskeletal:negative for back pain Behavioral/Psych: negative for abusive relationship, depression, illegal drug usage and tobacco use    Objective:    BP 120/80   Pulse (!) 106   Wt 162 lb 9.6 oz (73.8 kg)   LMP 02/17/2018 (Exact Date)   BMI 27.06 kg/m  General Appearance:    Alert, cooperative, no distress, appears stated age  Head:    Normocephalic, without obvious abnormality, atraumatic  Eyes:    PERRL, conjunctiva/corneas clear, EOM's intact, fundi    benign, both eyes  Ears:    Normal TM's and external ear canals, both ears  Nose:   Nares normal, septum midline, mucosa normal, no drainage    or sinus tenderness  Throat:   Lips, mucosa, and tongue normal; teeth and gums normal  Neck:   Supple, symmetrical, trachea midline, no adenopathy;    thyroid:  no enlargement/tenderness/nodules; no carotid   bruit or JVD  Back:     Symmetric, no curvature, ROM normal, no CVA tenderness  Lungs:     Clear to auscultation bilaterally, respirations  unlabored  Chest Wall:    No tenderness or deformity   Heart:    Regular rate and rhythm, S1 and S2 normal, no murmur, rub   or gallop  Breast Exam:    No tenderness, masses, or nipple abnormality  Abdomen:     Soft, non-tender, bowel sounds active all four quadrants,    no masses, no organomegaly  Genitalia:    Normal female without lesion, discharge or tenderness  Extremities:   Extremities normal, atraumatic, no cyanosis or edema  Pulses:   2+ and symmetric all extremities  Skin:   Skin color, texture, turgor normal, no rashes or lesions  Lymph nodes:   Cervical, supraclavicular, and axillary nodes normal  Neurologic:   CNII-XII intact, normal strength, sensation and reflexes    throughout      Lab Review Urine pregnancy test Labs reviewed yes Radiologic studies reviewed no Assessment:     Pregnancy at [redacted]w[redacted]d weeks    Plan:     1. Supervision of other normal pregnancy, antepartum Rx: - Enroll Patient in [facility] - Obstetric Panel, Including HIV - Hemoglobinopathy evaluation - Culture, OB Urine - Cervicovaginal ancillary only( [place]) - Genetic Screening  2. Vitamin D deficiency Rx: - Cholecalciferol (VITAMIN D) 2000 units CAPS; Take 1 capsule (2,000 Units total) by mouth daily before breakfast.  Dispense: 30 capsule; Refill: 11  3. Nausea and vomiting in pregnancy prior to [redacted] weeks gestation Rx: - Doxylamine-Pyridoxine (DICLEGIS) 10-10 MG TBEC; 1 tab in AM, 1 tab mid afternoon 2 tabs at bedtime. Max dose 4 tabs daily.  Dispense: 100 tablet; Refill: 5  Prenatal vitamins.  Counseling provided regarding continued use of seat belts, cessation of alcohol consumption, smoking or use of illicit drugs; infection precautions i.e., influenza/TDAP immunizations, toxoplasmosis,CMV, parvovirus, listeria and varicella; workplace safety, exercise during pregnancy; routine dental care, safe medications, sexual activity, hot tubs, saunas, pools, travel, caffeine use, fish and methlymercury, potential toxins, hair treatments, varicose veins Weight gain recommendations per IOM guidelines reviewed: underweight/BMI< 18.5--> gain 28 - 40 lbs; normal weight/BMI 18.5 - 24.9--> gain 25 - 35 lbs; overweight/BMI 25 - 29.9--> gain 15 - 25 lbs; obese/BMI >30->gain  11 - 20 lbs Problem list reviewed and updated. FIRST/CF mutation testing/NIPT/QUAD SCREEN/fragile X/[name] population testing/Spinal muscular atrophy discussed: requested. Role of ultrasound in pregnancy discussed; fetal survey: requested. Amniocentesis discussed: not indicated.  Meds ordered this encounter  Medications  . Doxylamine-Pyridoxine (DICLEGIS) 10-10 MG TBEC    Sig: 1 tab in AM, 1 tab mid afternoon 2 tabs at bedtime. Max dose 4 tabs daily.    Dispense:  100 tablet    Refill:  5  . Cholecalciferol (VITAMIN  D) 2000 units CAPS    Sig: Take 1 capsule (2,000 Units total) by mouth daily before breakfast.    Dispense:  30 capsule    Refill:  11   Orders Placed This Encounter  Procedures  . Culture, OB Urine  . Obstetric Panel, Including HIV  . Hemoglobinopathy evaluation  . Genetic Screening    Follow up in 6 weeks. 50% of 20 min visit spent on counseling and coordination of care.     Brock Bad MD 05-01-2018

## 2018-05-01 NOTE — Addendum Note (Signed)
Addended by: Coral Ceo A on: 05/01/2018 05:09 PM   Modules accepted: Kipp Brood

## 2018-05-02 MED FILL — VITAMIN D3 2000 UNIT CAPS: 50 MCG | 90 days supply | Qty: 90 | Fill #0

## 2018-05-03 LAB — CERVICOVAGINAL ANCILLARY ONLY
Bacterial vaginitis: NEGATIVE
CANDIDA VAGINITIS: NEGATIVE
CHLAMYDIA, DNA PROBE: NEGATIVE
Neisseria Gonorrhea: NEGATIVE
Trichomonas: NEGATIVE

## 2018-05-03 LAB — OBSTETRIC PANEL, INCLUDING HIV
BASOS: 0 %
Basophils Absolute: 0 10*3/uL (ref 0.0–0.2)
EOS (ABSOLUTE): 0.3 10*3/uL (ref 0.0–0.4)
EOS: 3 %
HEMATOCRIT: 34.8 % (ref 34.0–46.6)
HEMOGLOBIN: 11.7 g/dL (ref 11.1–15.9)
HIV Screen 4th Generation wRfx: NONREACTIVE
Hepatitis B Surface Ag: NEGATIVE
IMMATURE GRANS (ABS): 0 10*3/uL (ref 0.0–0.1)
Immature Granulocytes: 0 %
LYMPHS: 27 %
Lymphocytes Absolute: 3 10*3/uL (ref 0.7–3.1)
MCH: 27.7 pg (ref 26.6–33.0)
MCHC: 33.6 g/dL (ref 31.5–35.7)
MCV: 83 fL (ref 79–97)
MONOCYTES: 6 %
MONOS ABS: 0.7 10*3/uL (ref 0.1–0.9)
Neutrophils Absolute: 7 10*3/uL (ref 1.4–7.0)
Neutrophils: 64 %
Platelets: 335 10*3/uL (ref 150–450)
RBC: 4.22 x10E6/uL (ref 3.77–5.28)
RDW: 11.8 % — ABNORMAL LOW (ref 12.3–15.4)
RH TYPE: POSITIVE
RPR Ser Ql: NONREACTIVE
RUBELLA: 5.82 {index} (ref >0.99)
WBC: 11 10*3/uL — ABNORMAL HIGH (ref 3.4–10.8)

## 2018-05-03 LAB — AB SCR+ANTIBODY ID: ANTIBODY SCREEN: POSITIVE — AB

## 2018-05-03 LAB — URINE CULTURE, OB REFLEX

## 2018-05-03 LAB — CULTURE, OB URINE

## 2018-05-04 LAB — HEMOGLOBINOPATHY EVALUATION
HGB C: 0 %
HGB S: 0 %
HGB VARIANT: 0 %
Hemoglobin A2 Quantitation: 2.1 % (ref 1.8–3.2)
Hemoglobin F Quantitation: 0 % (ref 0.0–2.0)
Hgb A: 97.9 % (ref 96.4–98.8)

## 2018-05-06 ENCOUNTER — Other Ambulatory Visit: Payer: Self-pay | Admitting: Obstetrics

## 2018-05-06 DIAGNOSIS — M5440 Lumbago with sciatica, unspecified side: Principal | ICD-10-CM

## 2018-05-06 DIAGNOSIS — G8929 Other chronic pain: Secondary | ICD-10-CM

## 2018-05-06 MED ORDER — COMFORT FIT MATERNITY SUPP SM MISC: 0 refills | Status: DC

## 2018-05-08 ENCOUNTER — Encounter: Payer: Self-pay | Admitting: Obstetrics

## 2018-05-15 ENCOUNTER — Encounter: Payer: Self-pay | Admitting: Obstetrics and Gynecology

## 2018-05-15 DIAGNOSIS — Z98891 History of uterine scar from previous surgery: Secondary | ICD-10-CM | POA: Insufficient documentation

## 2018-05-16 DIAGNOSIS — M5440 Lumbago with sciatica, unspecified side: Secondary | ICD-10-CM | POA: Diagnosis not present

## 2018-05-16 DIAGNOSIS — O339 Maternal care for disproportion, unspecified: Secondary | ICD-10-CM | POA: Diagnosis not present

## 2018-05-27 ENCOUNTER — Ambulatory Visit (INDEPENDENT_AMBULATORY_CARE_PROVIDER_SITE_OTHER): Payer: 59

## 2018-05-27 DIAGNOSIS — Z23 Encounter for immunization: Secondary | ICD-10-CM | POA: Diagnosis not present

## 2018-05-27 DIAGNOSIS — Z3480 Encounter for supervision of other normal pregnancy, unspecified trimester: Secondary | ICD-10-CM

## 2018-05-27 NOTE — Progress Notes (Addendum)
Presents for FLU vaccine, given RD, tolerated well.  LOT#  [id number] EXP:   01/07/19 NDC:  [id number]

## 2018-06-05 ENCOUNTER — Encounter: Payer: Self-pay | Admitting: Obstetrics

## 2018-06-05 ENCOUNTER — Ambulatory Visit (INDEPENDENT_AMBULATORY_CARE_PROVIDER_SITE_OTHER): Payer: 59 | Admitting: Obstetrics

## 2018-06-05 VITALS — BP 112/76 | HR 106 | Wt 166.7 lb

## 2018-06-05 DIAGNOSIS — Z3482 Encounter for supervision of other normal pregnancy, second trimester: Secondary | ICD-10-CM

## 2018-06-05 DIAGNOSIS — Z3480 Encounter for supervision of other normal pregnancy, unspecified trimester: Secondary | ICD-10-CM

## 2018-06-05 DIAGNOSIS — Z3A15 15 weeks gestation of pregnancy: Secondary | ICD-10-CM

## 2018-06-05 NOTE — Progress Notes (Signed)
Subjective:  Sherry NissenDonquashia G Sheppard is a 32 y.o. [id number] at 67108w3d being seen today for ongoing prenatal care.  She is currently monitored for the following issues for this low-risk pregnancy and has Seizures (HCC); AVM (arteriovenous malformation) brain; Allergic rhinitis; Nondisplaced fracture of first metatarsal bone, right foot, initial encounter for closed fracture; Supervision of other normal pregnancy, antepartum; and History of cesarean delivery on their problem list.  Patient reports backache.  Contractions: Not present. Vag. Bleeding: None.  Movement: Present. Denies leaking of fluid.   The following portions of the patient's history were reviewed and updated as appropriate: allergies, current medications, past family history, past medical history, past social history, past surgical history and problem list. Problem list updated.  Objective:   Vitals:   06/05/18 1547  BP: 112/76  Pulse: (!) 106  Weight: 166 lb 11.2 oz (75.6 kg)    Fetal Status:     Movement: Present     General:  Alert, oriented and cooperative. Patient is in no acute distress.  Skin: Skin is warm and dry. No rash noted.   Cardiovascular: Normal heart rate noted  Respiratory: Normal respiratory effort, no problems with respiration noted  Abdomen: Soft, gravid, appropriate for gestational age. Pain/Pressure: Absent     Pelvic:  Cervical exam deferred        Extremities: Normal range of motion.  Edema: None  Mental Status: Normal mood and affect. Normal behavior. Normal judgment and thought content.   Urinalysis:      Assessment and Plan:  Pregnancy: [id number] at 12108w3d  1. Supervision of other normal pregnancy, antepartum Rx: - US MFM OB COMP + 14 WK; Future  Preterm labor symptoms and general obstetric precautions including but not limited to vaginal bleeding, contractions, leaking of fluid and fetal movement were reviewed in detail with the patient. Please refer to After Visit Summary for other counseling  recommendations.  Return in about 4 weeks (around 07/03/2018) for ROB.   Brock BadHarper, Tracee Mccreery A, MD

## 2018-06-12 ENCOUNTER — Ambulatory Visit: Payer: 59

## 2018-06-12 DIAGNOSIS — Z3480 Encounter for supervision of other normal pregnancy, unspecified trimester: Secondary | ICD-10-CM

## 2018-06-12 NOTE — Progress Notes (Signed)
Nurse visit for 17p. After researching pt's chart and consulting with Dr. Clearance CootsHarper, pt does not have hx of PTL; therefore, she does not need 17p injections.

## 2018-06-19 ENCOUNTER — Ambulatory Visit: Payer: 59

## 2018-06-25 ENCOUNTER — Encounter (HOSPITAL_COMMUNITY): Payer: Self-pay

## 2018-06-26 ENCOUNTER — Ambulatory Visit: Payer: 59

## 2018-07-01 MED FILL — DOXYLAMINE-PYRIDOXINE 10-10: 10-10 | 22 days supply | Qty: 85 | Fill #1

## 2018-07-04 ENCOUNTER — Inpatient Hospital Stay (HOSPITAL_COMMUNITY)
Admission: AD | Admit: 2018-07-04 | Discharge: 2018-07-04 | Disposition: A | Discharge status: Home / Self Care | Payer: 59 | Source: Ambulatory Visit | Attending: Obstetrics & Gynecology | Admitting: Obstetrics & Gynecology

## 2018-07-04 ENCOUNTER — Ambulatory Visit (INDEPENDENT_AMBULATORY_CARE_PROVIDER_SITE_OTHER): Payer: 59 | Admitting: Obstetrics

## 2018-07-04 ENCOUNTER — Encounter: Payer: Self-pay | Admitting: Obstetrics

## 2018-07-04 ENCOUNTER — Other Ambulatory Visit: Payer: Self-pay | Admitting: Obstetrics

## 2018-07-04 ENCOUNTER — Encounter (HOSPITAL_COMMUNITY): Payer: Self-pay | Admitting: *Deleted

## 2018-07-04 ENCOUNTER — Ambulatory Visit (HOSPITAL_BASED_OUTPATIENT_CLINIC_OR_DEPARTMENT_OTHER)
Admission: RE | Admit: 2018-07-04 | Discharge: 2018-07-04 | Disposition: A | Discharge status: Home / Self Care | Payer: 59 | Source: Ambulatory Visit | Attending: Obstetrics | Admitting: Obstetrics

## 2018-07-04 VITALS — BP 116/76 | HR 102 | Wt 172.6 lb

## 2018-07-04 DIAGNOSIS — G43909 Migraine, unspecified, not intractable, without status migrainosus: Secondary | ICD-10-CM | POA: Insufficient documentation

## 2018-07-04 DIAGNOSIS — Z3A19 19 weeks gestation of pregnancy: Secondary | ICD-10-CM

## 2018-07-04 DIAGNOSIS — Z363 Encounter for antenatal screening for malformations: Secondary | ICD-10-CM | POA: Insufficient documentation

## 2018-07-04 DIAGNOSIS — Z91018 Allergy to other foods: Secondary | ICD-10-CM | POA: Diagnosis not present

## 2018-07-04 DIAGNOSIS — G43009 Migraine without aura, not intractable, without status migrainosus: Secondary | ICD-10-CM | POA: Diagnosis not present

## 2018-07-04 DIAGNOSIS — O99352 Diseases of the nervous system complicating pregnancy, second trimester: Secondary | ICD-10-CM | POA: Insufficient documentation

## 2018-07-04 DIAGNOSIS — O34219 Maternal care for unspecified type scar from previous cesarean delivery: Secondary | ICD-10-CM | POA: Insufficient documentation

## 2018-07-04 DIAGNOSIS — G40909 Epilepsy, unspecified, not intractable, without status epilepticus: Secondary | ICD-10-CM | POA: Insufficient documentation

## 2018-07-04 DIAGNOSIS — Z833 Family history of diabetes mellitus: Secondary | ICD-10-CM | POA: Diagnosis not present

## 2018-07-04 DIAGNOSIS — O26892 Other specified pregnancy related conditions, second trimester: Secondary | ICD-10-CM | POA: Diagnosis not present

## 2018-07-04 DIAGNOSIS — Z3492 Encounter for supervision of normal pregnancy, unspecified, second trimester: Secondary | ICD-10-CM

## 2018-07-04 DIAGNOSIS — Z91041 Radiographic dye allergy status: Secondary | ICD-10-CM | POA: Diagnosis not present

## 2018-07-04 DIAGNOSIS — Z3490 Encounter for supervision of normal pregnancy, unspecified, unspecified trimester: Secondary | ICD-10-CM

## 2018-07-04 DIAGNOSIS — Z3480 Encounter for supervision of other normal pregnancy, unspecified trimester: Secondary | ICD-10-CM

## 2018-07-04 DIAGNOSIS — G43119 Migraine with aura, intractable, without status migrainosus: Secondary | ICD-10-CM

## 2018-07-04 DIAGNOSIS — Z801 Family history of malignant neoplasm of trachea, bronchus and lung: Secondary | ICD-10-CM | POA: Diagnosis not present

## 2018-07-04 LAB — URINALYSIS, ROUTINE W REFLEX MICROSCOPIC
BILIRUBIN URINE: NEGATIVE
GLUCOSE, UA: NEGATIVE mg/dL
HGB URINE DIPSTICK: NEGATIVE
Ketones, ur: NEGATIVE mg/dL
NITRITE: NEGATIVE
PH: 6 (ref 5.0–8.0)
Protein, ur: NEGATIVE mg/dL
SPECIFIC GRAVITY, URINE: 1.019 (ref 1.005–1.030)

## 2018-07-04 MED ORDER — BUTALBITAL-APAP-CAFFEINE 50-325-40 MG PO TABS: 2.0000 | ORAL_TABLET | Freq: Four times a day (QID) | ORAL | 2 refills | Status: DC | PRN

## 2018-07-04 MED ORDER — LACTATED RINGERS IV SOLN
INTRAVENOUS | Status: DC
  Administered 2018-07-04: 18:00:00 via INTRAVENOUS

## 2018-07-04 MED ORDER — DEXAMETHASONE SODIUM PHOSPHATE 10 MG/ML IJ SOLN
10.0000 mg | Freq: Once | INTRAMUSCULAR | Status: AC
  Administered 2018-07-04: 10 mg via INTRAVENOUS
  Filled 2018-07-04: qty 1

## 2018-07-04 MED ORDER — DIPHENHYDRAMINE HCL 50 MG/ML IJ SOLN
25.0000 mg | Freq: Once | INTRAMUSCULAR | Status: AC
  Administered 2018-07-04: 25 mg via INTRAVENOUS
  Filled 2018-07-04: qty 1

## 2018-07-04 MED ORDER — METOCLOPRAMIDE HCL 5 MG/ML IJ SOLN
5.0000 mg | Freq: Once | INTRAMUSCULAR | Status: DC
  Filled 2018-07-04: qty 2

## 2018-07-04 MED ORDER — OXYCODONE HCL 5 MG PO TABS
5.0000 mg | ORAL_TABLET | Freq: Once | ORAL | Status: AC
  Administered 2018-07-04: 5 mg via ORAL
  Filled 2018-07-04: qty 1

## 2018-07-04 MED FILL — BUTALB-ACETAMIN-CAFF 50-325: 50-325-40 | 4 days supply | Qty: 30 | Fill #0

## 2018-07-04 NOTE — Discharge Instructions (Signed)

## 2018-07-04 NOTE — Progress Notes (Signed)
Subjective:  Sherry NissenDonquashia G Sheppard is a 32 y.o. [id number] at 4374w4d being seen today for ongoing prenatal care.  She is currently monitored for the following issues for this high-risk pregnancy and has Seizures (HCC); AVM (arteriovenous malformation) brain; Allergic rhinitis; Nondisplaced fracture of first metatarsal bone, right foot, initial encounter for closed fracture; Supervision of other normal pregnancy, antepartum; and History of cesarean delivery on their problem list.  Patient reports headache.  Contractions: Not present. Vag. Bleeding: None.  Movement: Present. Denies leaking of fluid.   The following portions of the patient's history were reviewed and updated as appropriate: allergies, current medications, past family history, past medical history, past social history, past surgical history and problem list. Problem list updated.  Objective:   Vitals:   07/04/18 1527  BP: 116/76  Pulse: (!) 102  Weight: 172 lb 9.6 oz (78.3 kg)    Fetal Status:     Movement: Present     General:  Alert, oriented and cooperative. Patient is in no acute distress.  Skin: Skin is warm and dry. No rash noted.   Cardiovascular: Normal heart rate noted  Respiratory: Normal respiratory effort, no problems with respiration noted  Abdomen: Soft, gravid, appropriate for gestational age. Pain/Pressure: Present     Pelvic:  Cervical exam deferred        Extremities: Normal range of motion.  Edema: None  Mental Status: Normal mood and affect. Normal behavior. Normal judgment and thought content.   Urinalysis:      Assessment and Plan:  Pregnancy: [id number] at 1174w4d  1. Encounter for supervision of normal pregnancy, antepartum, unspecified gravidity  2. Intractable migraine with aura without status migrainosus Rx: - butalbital-acetaminophen-caffeine (FIORICET, ESGIC) 50-325-40 MG tablet; Take 2 tablets by mouth every 6 (six) hours as needed for headache or migraine.  Dispense: 30 tablet; Refill: 2  3.  [facility]  Preterm labor symptoms and general obstetric precautions including but not limited to vaginal bleeding, contractions, leaking of fluid and fetal movement were reviewed in detail with the patient. Please refer to After Visit Summary for other counseling recommendations.  Return in about 4 weeks (around 08/01/2018) for ROB.   Brock BadHarper, Devynn Scheff A, MD

## 2018-07-04 NOTE — MAU Note (Signed)
Pt reports headache"migraine" for the last few days.

## 2018-07-04 NOTE — MAU Provider Note (Signed)
Chief Complaint:  Headache   First Provider Initiated Contact with Patient 07/04/18 1752     HPI: Sherry Sheppard is a 32 y.o. [id number] at 7519w4dwho presents to maternity admissions reporting migraine headache since Monday.  Usually takes Topamax when not pregnant.  Tried Tylenol without relief.  Dr Clearance CootsHarper gave her Rx for Fioricet. . She reports good fetal movement, denies LOF, vaginal bleeding, vaginal itching/burning, urinary symptoms, dizziness, n/v, diarrhea, constipation or fever/chills.   Headache   This is a recurrent problem. The current episode started in the past 7 days. The problem occurs constantly. The problem has been unchanged. The pain is located in the frontal region. The pain does not radiate. The pain quality is similar to prior headaches. The quality of the pain is described as aching. Pertinent negatives include no abdominal pain, anorexia, back pain, blurred vision, fever, nausea, photophobia, sinus pressure or visual change. Nothing aggravates the symptoms. She has tried acetaminophen for the symptoms. The treatment provided no relief.     Past Medical History: Past Medical History:  Diagnosis Date  . Allergic rhinitis   . Migraines   . Seizures (HCC)    last event 08/24/12  . Vaginal Pap smear, abnormal     Past obstetric history: OB History  Gravida Para Term Preterm AB Living  4 2 2   1 2   SAB TAB Ectopic Multiple Live Births  1       2    # Outcome Date GA Lbr Len/2nd Weight Sex Delivery Anes PTL Lv  4 Current           3 Term 08/24/12 6680w3d  3650 g M CS-Vac Spinal  LIV  2 SAB 2013             Birth Comments: System Generated. Please review and update pregnancy details.  1 Term 04/07/08    M CS-LTranv   LIV     Birth Comments: c/S because FTP and fetal distress    Past Surgical History: Past Surgical History:  Procedure Laterality Date  . 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 CESAREAN SECTION  2009  . CESAREAN SECTION N/A  08/24/2012   Procedure: CESAREAN SECTION;  Surgeon: Kathreen CosierBernard A Marshall, MD;  Location: [facility];  Service: Obstetrics;  Laterality: N/A;  . DILATION AND EVACUATION  09/20/2011   Procedure: DILATATION AND EVACUATION;  Surgeon: Brock Badharles A Harper, MD;  Location: [facility];  Service: Gynecology;  Laterality: N/A;  . TOOTH EXTRACTION  2012    Family History: Family History  Problem Relation Age of Onset  . Hyperlipidemia Mother   . Diabetes Mother   . Hypertension Father   . Lung cancer Maternal Grandmother [id number]  . Hypertension Maternal Aunt   . Diabetes Cousin     Social History: Social History   Tobacco Use  . Smoking status: Never Smoker  . Smokeless tobacco: Never Used  Substance Use Topics  . Alcohol use: No    Alcohol/week: 0.0 standard drinks  . Drug use: No    Allergies:  Allergies  Allergen Reactions  . Banana Shortness Of Breath  . Other Nausea And Vomiting    Contrast dye    Meds:  Medications Prior to Admission  Medication Sig Dispense Refill Last Dose  . Acetaminophen (TYLENOL PO) Take by mouth.   07/04/2018 at Unknown time  . Cholecalciferol (VITAMIN D) 2000 units CAPS Take 1 capsule (2,000 Units total) by mouth daily before breakfast.  30 capsule 11 07/04/2018 at Unknown time  . Doxylamine-Pyridoxine (DICLEGIS) 10-10 MG TBEC 1 tab in AM, 1 tab mid afternoon 2 tabs at bedtime. Max dose 4 tabs daily. 100 tablet 5 Past Week at Unknown time  . Prenatal Vit-Fe Fumarate-FA (PRENATAL VITAMIN PLUS LOW IRON) 27-1 MG TABS TAKE 1 TABLET BY MOUTH DAILY BEFORE BREAKFAST. 30 tablet 11 07/04/2018 at Unknown time  . albuterol (PROVENTIL HFA;VENTOLIN HFA) 108 (90 Base) MCG/ACT inhaler Inhale 2 puffs into the lungs every 6 (six) hours as needed for wheezing or shortness of breath. (Patient not taking: Reported on 11/06/2017) 1 Inhaler 0 Not Taking  . butalbital-acetaminophen-caffeine (FIORICET, ESGIC) 50-325-40 MG tablet Take 2 tablets by mouth every 6 (six) hours as needed for headache or  migraine. 30 tablet 2   . Elastic Bandages & Supports (COMFORT FIT MATERNITY SUPP SM) MISC Wear as directed. 1 each 0 Taking  . promethazine (PHENERGAN) 25 MG tablet Take 1 tablet (25 mg total) by mouth every 6 (six) hours as needed for nausea or vomiting. 30 tablet 2 07/02/2018  . Vitamin D, Ergocalciferol, (DRISDOL) 50000 units CAPS capsule Take 1 capsule (50,000 Units total) by mouth every 7 (seven) days. (Patient not taking: Reported on 11/06/2017) 12 capsule 0 Not Taking    I have reviewed patient's Past Medical Hx, Surgical Hx, Family Hx, Social Hx, medications and allergies.   ROS:  Review of Systems  Constitutional: Negative for fever.  HENT: Negative for sinus pressure.   Eyes: Negative for blurred vision and photophobia.  Gastrointestinal: Negative for abdominal pain, anorexia and nausea.  Musculoskeletal: Negative for back pain.  Neurological: Positive for headaches.   Other systems negative  Physical Exam   Patient Vitals for the past 24 hrs:  BP Temp Temp src Pulse Resp SpO2 Height Weight  07/04/18 1801 98/66 98.4 F (36.9 C) Oral 92 16 100 % - -  07/04/18 1754 - - - - - - 5\' 5"  (1.651 m) 78.5 kg   Constitutional: Well-developed, well-nourished female in no acute distress.  Cardiovascular: normal rate and rhythm Respiratory: normal effort, clear to auscultation bilaterally GI: Abd soft, non-tender, gravid appropriate for gestational age.   No rebound or guarding. MS: Extremities nontender, no edema, normal ROM Neurologic: Alert and oriented x 4.  GU: Neg CVAT.  PELVIC EXAM: deferred  FHT:  149   Labs: A/Positive/-- (10/23 1630) Results for orders placed or performed during the hospital encounter of 07/04/18 (from the past 24 hour(s))  Urinalysis, Routine w reflex microscopic     Status: Abnormal   Collection Time: 07/04/18  5:53 PM  Result Value Ref Range   Color, Urine YELLOW YELLOW   APPearance CLOUDY (A) CLEAR   Specific Gravity, Urine 1.019 1.005 - 1.030    pH 6.0 5.0 - 8.0   Glucose, UA NEGATIVE NEGATIVE mg/dL   Hgb urine dipstick NEGATIVE NEGATIVE   Bilirubin Urine NEGATIVE NEGATIVE   Ketones, ur NEGATIVE NEGATIVE mg/dL   Protein, ur NEGATIVE NEGATIVE mg/dL   Nitrite NEGATIVE NEGATIVE   Leukocytes, UA TRACE (A) NEGATIVE   RBC / HPF 0-5 0 - 5 RBC/hpf   WBC, UA 11-20 0 - 5 WBC/hpf   Bacteria, UA MANY (A) NONE SEEN   Squamous Epithelial / LPF 0-5 0 - 5   Mucus PRESENT     Imaging:    MAU Course/MDM: I have ordered labs and reviewed results.  Discussed usual treatment for migraines including IVF/Reglan/Benadryl and Decadron.   Patient declines Reglan but agreed  to other two Got some relief from a "5" to a "4" Will add Oxy IR to her treatment for more complete relief >> pain went down to a "1"  Assessment: Single intrauterine pregnancy at 6546w4d Migraine headache  Plan: Discharge home Has Rx for Fiorecet at home Pt states she will call her Neurologist for an update Follow up in Office for prenatal visits and recheck  Encouraged to return here or to other Urgent Care/ED if she develops worsening of symptoms, increase in pain, fever, or other concerning symptoms.   Pt stable at time of discharge.  Wynelle BourgeoisMarie Meganne Rita CNM, MSN Certified Nurse-Midwife 07/04/2018 6:05 PM

## 2018-07-05 ENCOUNTER — Other Ambulatory Visit (HOSPITAL_COMMUNITY): Payer: Self-pay | Admitting: *Deleted

## 2018-07-05 ENCOUNTER — Other Ambulatory Visit (HOSPITAL_COMMUNITY): Payer: 59

## 2018-07-05 DIAGNOSIS — Z362 Encounter for other antenatal screening follow-up: Secondary | ICD-10-CM

## 2018-07-18 DIAGNOSIS — G43009 Migraine without aura, not intractable, without status migrainosus: Secondary | ICD-10-CM | POA: Diagnosis not present

## 2018-07-18 MED FILL — CYPROHEPTADINE 4 MG TABLET: 4 | 10 days supply | Qty: 30 | Fill #0

## 2018-08-02 ENCOUNTER — Ambulatory Visit (HOSPITAL_COMMUNITY): Payer: 59

## 2018-08-09 ENCOUNTER — Ambulatory Visit (INDEPENDENT_AMBULATORY_CARE_PROVIDER_SITE_OTHER): Payer: 59 | Admitting: Obstetrics

## 2018-08-09 ENCOUNTER — Ambulatory Visit (HOSPITAL_COMMUNITY)
Admission: RE | Admit: 2018-08-09 | Discharge: 2018-08-09 | Disposition: A | Discharge status: Home / Self Care | Payer: 59 | Source: Ambulatory Visit | Attending: Obstetrics | Admitting: Obstetrics

## 2018-08-09 ENCOUNTER — Encounter: Payer: Self-pay | Admitting: Obstetrics

## 2018-08-09 VITALS — BP 134/74 | HR 115

## 2018-08-09 DIAGNOSIS — Z3A24 24 weeks gestation of pregnancy: Secondary | ICD-10-CM

## 2018-08-09 DIAGNOSIS — Z3492 Encounter for supervision of normal pregnancy, unspecified, second trimester: Secondary | ICD-10-CM

## 2018-08-09 DIAGNOSIS — O34219 Maternal care for unspecified type scar from previous cesarean delivery: Secondary | ICD-10-CM | POA: Diagnosis not present

## 2018-08-09 DIAGNOSIS — Z362 Encounter for other antenatal screening follow-up: Secondary | ICD-10-CM | POA: Diagnosis not present

## 2018-08-09 DIAGNOSIS — G40909 Epilepsy, unspecified, not intractable, without status epilepticus: Secondary | ICD-10-CM | POA: Diagnosis not present

## 2018-08-09 DIAGNOSIS — O2692 Pregnancy related conditions, unspecified, second trimester: Secondary | ICD-10-CM | POA: Diagnosis not present

## 2018-08-09 DIAGNOSIS — Z3490 Encounter for supervision of normal pregnancy, unspecified, unspecified trimester: Secondary | ICD-10-CM

## 2018-08-09 DIAGNOSIS — O99352 Diseases of the nervous system complicating pregnancy, second trimester: Secondary | ICD-10-CM | POA: Diagnosis not present

## 2018-08-09 MED FILL — PRENATAL VITAMIN PLUS LOW I: 27-1 | 90 days supply | Qty: 90 | Fill #2

## 2018-08-09 MED FILL — SM VITAMIN D3 2,000 UNIT SF: 50 MCG | 90 days supply | Qty: 90 | Fill #1

## 2018-08-09 NOTE — Progress Notes (Signed)
Subjective:  Sherry Sheppard is a 33 y.o. [phone] at [redacted]w[redacted]d being seen today for ongoing prenatal care.  She is currently monitored for the following issues for this low-risk pregnancy and has Seizures (HCC); AVM (arteriovenous malformation) brain; Allergic rhinitis; Nondisplaced fracture of first metatarsal bone, right foot, initial encounter for closed fracture; Supervision of other normal pregnancy, antepartum; and History of cesarean delivery on their problem list.  Patient reports no complaints.  Contractions: Irregular. Vag. Bleeding: None.  Movement: Present. Denies leaking of fluid.   The following portions of the patient's history were reviewed and updated as appropriate: allergies, current medications, past family history, past medical history, past social history, past surgical history and problem list. Problem list updated.  Objective:   Vitals:   08/09/18 1101  BP: 134/74  Pulse: (!) 115    Fetal Status:     Movement: Present     General:  Alert, oriented and cooperative. Patient is in no acute distress.  Skin: Skin is warm and dry. No rash noted.   Cardiovascular: Normal heart rate noted  Respiratory: Normal respiratory effort, no problems with respiration noted  Abdomen: Soft, gravid, appropriate for gestational age. Pain/Pressure: Present     Pelvic:  Cervical exam deferred        Extremities: Normal range of motion.     Mental Status: Normal mood and affect. Normal behavior. Normal judgment and thought content.   Urinalysis:      Assessment and Plan:  Pregnancy: [id number] at [redacted]w[redacted]d  1. Encounter for supervision of normal pregnancy, antepartum, unspecified gravidity   Preterm labor symptoms and general obstetric precautions including but not limited to vaginal bleeding, contractions, leaking of fluid and fetal movement were reviewed in detail with the patient. Please refer to After Visit Summary for other counseling recommendations.  Return in about 4 weeks (around  09/06/2018) for ROB, 2 hour OGTT.   Brock Bad, MD

## 2018-08-19 ENCOUNTER — Other Ambulatory Visit: Payer: Self-pay | Admitting: Obstetrics

## 2018-08-19 DIAGNOSIS — M549 Dorsalgia, unspecified: Secondary | ICD-10-CM

## 2018-08-19 MED ORDER — CYCLOBENZAPRINE HCL 5 MG PO TABS: 5.0000 mg | ORAL_TABLET | Freq: Three times a day (TID) | ORAL | 2 refills | Status: DC | PRN

## 2018-08-19 MED FILL — CYCLOBENZAPRINE HCL 5 MG TA: 5 | 10 days supply | Qty: 30 | Fill #0

## 2018-09-06 ENCOUNTER — Ambulatory Visit (INDEPENDENT_AMBULATORY_CARE_PROVIDER_SITE_OTHER): Payer: 59 | Admitting: Obstetrics

## 2018-09-06 ENCOUNTER — Encounter: Payer: Self-pay | Admitting: Obstetrics

## 2018-09-06 ENCOUNTER — Other Ambulatory Visit: Payer: 59

## 2018-09-06 VITALS — BP 120/83 | HR 111 | Wt 182.4 lb

## 2018-09-06 DIAGNOSIS — Z3490 Encounter for supervision of normal pregnancy, unspecified, unspecified trimester: Secondary | ICD-10-CM

## 2018-09-06 DIAGNOSIS — Z3A28 28 weeks gestation of pregnancy: Secondary | ICD-10-CM

## 2018-09-06 DIAGNOSIS — O0990 Supervision of high risk pregnancy, unspecified, unspecified trimester: Secondary | ICD-10-CM | POA: Diagnosis not present

## 2018-09-06 DIAGNOSIS — Z3493 Encounter for supervision of normal pregnancy, unspecified, third trimester: Secondary | ICD-10-CM

## 2018-09-06 DIAGNOSIS — Z23 Encounter for immunization: Secondary | ICD-10-CM | POA: Diagnosis not present

## 2018-09-06 NOTE — Progress Notes (Signed)
Patient reports good fetal movement with irregular contractions and pressure.

## 2018-09-06 NOTE — Progress Notes (Signed)
   Subjective:  Sherry Sheppard is a 33 y.o. [phone] at [redacted]w[redacted]d being seen today for ongoing prenatal care.  She is currently monitored for the following issues for this low-risk pregnancy and has Seizures (HCC); AVM (arteriovenous malformation) brain; Allergic rhinitis; Nondisplaced fracture of first metatarsal bone, right foot, initial encounter for closed fracture; Supervision of other normal pregnancy, antepartum; and History of cesarean delivery on their problem list.  Patient reports no complaints.  Contractions: Irregular. Vag. Bleeding: None.  Movement: Present. Denies leaking of fluid.   The following portions of the patient's history were reviewed and updated as appropriate: allergies, current medications, past family history, past medical history, past social history, past surgical history and problem list. Problem list updated.  Objective:   Vitals:   09/06/18 0819  BP: 120/83  Pulse: (!) 111  Weight: 182 lb 6.4 oz (82.7 kg)    Fetal Status:     Movement: Present     General:  Alert, oriented and cooperative. Patient is in no acute distress.  Skin: Skin is warm and dry. No rash noted.   Cardiovascular: Normal heart rate noted  Respiratory: Normal respiratory effort, no problems with respiration noted  Abdomen: Soft, gravid, appropriate for gestational age. Pain/Pressure: Present     Pelvic:  Cervical exam deferred        Extremities: Normal range of motion.  Edema: None  Mental Status: Normal mood and affect. Normal behavior. Normal judgment and thought content.   Urinalysis:      Assessment and Plan:  Pregnancy: [id number] at [redacted]w[redacted]d  1. Encounter for supervision of low-risk pregnancy, antepartum Rx: - Glucose Tolerance, 2 Hours w/1 Hour - HIV antibody (with reflex) - RPR - CBC  Preterm labor symptoms and general obstetric precautions including but not limited to vaginal bleeding, contractions, leaking of fluid and fetal movement were reviewed in detail with the  patient. Please refer to After Visit Summary for other counseling recommendations.  Return in about 2 weeks (around 09/20/2018) for ROB.   Brock Bad, MD

## 2018-09-07 ENCOUNTER — Other Ambulatory Visit: Payer: Self-pay | Admitting: Obstetrics

## 2018-09-07 LAB — CBC
HEMOGLOBIN: 10.8 g/dL — AB (ref 11.1–15.9)
Hematocrit: 33.1 % — ABNORMAL LOW (ref 34.0–46.6)
MCH: 27.1 pg (ref 26.6–33.0)
MCHC: 32.6 g/dL (ref 31.5–35.7)
MCV: 83 fL (ref 79–97)
Platelets: 308 10*3/uL (ref 150–450)
RBC: 3.98 x10E6/uL (ref 3.77–5.28)
RDW: 13 % (ref 11.7–15.4)
WBC: 13.2 10*3/uL — ABNORMAL HIGH (ref 3.4–10.8)

## 2018-09-07 LAB — GLUCOSE TOLERANCE, 2 HOURS W/ 1HR
GLUCOSE, FASTING: 78 mg/dL (ref 65–91)
Glucose, 1 hour: 134 mg/dL (ref 65–179)
Glucose, 2 hour: 118 mg/dL (ref 65–152)

## 2018-09-07 LAB — HIV ANTIBODY (ROUTINE TESTING W REFLEX): HIV Screen 4th Generation wRfx: NONREACTIVE

## 2018-09-07 LAB — RPR: RPR: NONREACTIVE

## 2018-09-09 ENCOUNTER — Inpatient Hospital Stay (HOSPITAL_COMMUNITY)
Admission: AD | Admit: 2018-09-09 | Discharge: 2018-09-09 | Disposition: A | Discharge status: Home / Self Care | Payer: 59 | Attending: Obstetrics and Gynecology | Admitting: Obstetrics and Gynecology

## 2018-09-09 ENCOUNTER — Encounter (HOSPITAL_COMMUNITY): Payer: Self-pay | Admitting: *Deleted

## 2018-09-09 DIAGNOSIS — O26893 Other specified pregnancy related conditions, third trimester: Secondary | ICD-10-CM | POA: Diagnosis not present

## 2018-09-09 DIAGNOSIS — R000 Tachycardia, unspecified: Secondary | ICD-10-CM | POA: Insufficient documentation

## 2018-09-09 DIAGNOSIS — O4703 False labor before 37 completed weeks of gestation, third trimester: Secondary | ICD-10-CM | POA: Insufficient documentation

## 2018-09-09 DIAGNOSIS — O479 False labor, unspecified: Secondary | ICD-10-CM

## 2018-09-09 DIAGNOSIS — Z3A29 29 weeks gestation of pregnancy: Secondary | ICD-10-CM | POA: Diagnosis not present

## 2018-09-09 DIAGNOSIS — O4700 False labor before 37 completed weeks of gestation, unspecified trimester: Secondary | ICD-10-CM

## 2018-09-09 LAB — URINALYSIS, ROUTINE W REFLEX MICROSCOPIC
BILIRUBIN URINE: NEGATIVE
GLUCOSE, UA: NEGATIVE mg/dL
Hgb urine dipstick: NEGATIVE
Ketones, ur: NEGATIVE mg/dL
Leukocytes,Ua: NEGATIVE
Nitrite: NEGATIVE
Protein, ur: NEGATIVE mg/dL
SPECIFIC GRAVITY, URINE: 1.018 (ref 1.005–1.030)
pH: 6 (ref 5.0–8.0)

## 2018-09-09 MED ORDER — ACETAMINOPHEN 500 MG PO TABS
1000.0000 mg | ORAL_TABLET | Freq: Once | ORAL | Status: AC
  Administered 2018-09-09: 1000 mg via ORAL
  Filled 2018-09-09: qty 2

## 2018-09-09 MED ORDER — NIFEDIPINE 10 MG PO CAPS
10.0000 mg | ORAL_CAPSULE | ORAL | Status: AC
  Administered 2018-09-09 (×3): 10 mg via ORAL
  Filled 2018-09-09 (×3): qty 1

## 2018-09-09 NOTE — MAU Note (Signed)
Patient having constant vaginal pressure, especially when up walking around.  Having CTX for past month, now having 2-3/hr.  Also c/o feeling of SOB when walking.

## 2018-09-09 NOTE — Discharge Instructions (Signed)
Braxton Hicks Contractions Contractions of the uterus can occur throughout pregnancy, but they are not always a sign that you are in labor. You may have practice contractions called Braxton Hicks contractions. These false labor contractions are sometimes confused with true labor. What are Braxton Hicks contractions? Braxton Hicks contractions are tightening movements that occur in the muscles of the uterus before labor. Unlike true labor contractions, these contractions do not result in opening (dilation) and thinning of the cervix. Toward the end of pregnancy (32-34 weeks), Braxton Hicks contractions can happen more often and may become stronger. These contractions are sometimes difficult to tell apart from true labor because they can be very uncomfortable. You should not feel embarrassed if you go to the hospital with false labor. Sometimes, the only way to tell if you are in true labor is for your health care provider to look for changes in the cervix. The health care provider will do a physical exam and may monitor your contractions. If you are not in true labor, the exam should show that your cervix is not dilating and your water has not broken. If there are no other health problems associated with your pregnancy, it is completely safe for you to be sent home with false labor. You may continue to have Braxton Hicks contractions until you go into true labor. How to tell the difference between true labor and false labor True labor  Contractions last 30-70 seconds.  Contractions become very regular.  Discomfort is usually felt in the top of the uterus, and it spreads to the lower abdomen and low back.  Contractions do not go away with walking.  Contractions usually become more intense and increase in frequency.  The cervix dilates and gets thinner. False labor  Contractions are usually shorter and not as strong as true labor contractions.  Contractions are usually irregular.  Contractions  are often felt in the front of the lower abdomen and in the groin.  Contractions may go away when you walk around or change positions while lying down.  Contractions get weaker and are shorter-lasting as time goes on.  The cervix usually does not dilate or become thin. Follow these instructions at home:   Take over-the-counter and prescription medicines only as told by your health care provider.  Keep up with your usual exercises and follow other instructions from your health care provider.  Eat and drink lightly if you think you are going into labor.  If Braxton Hicks contractions are making you uncomfortable: ? Change your position from lying down or resting to walking, or change from walking to resting. ? Sit and rest in a tub of warm water. ? Drink enough fluid to keep your urine pale yellow. Dehydration may cause these contractions. ? Do slow and deep breathing several times an hour.  Keep all follow-up prenatal visits as told by your health care provider. This is important. Contact a health care provider if:  You have a fever.  You have continuous pain in your abdomen. Get help right away if:  Your contractions become stronger, more regular, and closer together.  You have fluid leaking or gushing from your vagina.  You pass blood-tinged mucus (bloody show).  You have bleeding from your vagina.  You have low back pain that you never had before.  You feel your baby's head pushing down and causing pelvic pressure.  Your baby is not moving inside you as much as it used to. Summary  Contractions that occur before labor are   called Braxton Hicks contractions, false labor, or practice contractions.  Braxton Hicks contractions are usually shorter, weaker, farther apart, and less regular than true labor contractions. True labor contractions usually become progressively stronger and regular, and they become more frequent.  Manage discomfort from Braxton Hicks contractions  by changing position, resting in a warm bath, drinking plenty of water, or practicing deep breathing. This information is not intended to replace advice given to you by your health care provider. Make sure you discuss any questions you have with your health care provider. Document Released: 11/09/2016 Document Revised: 04/10/2017 Document Reviewed: 11/09/2016 Elsevier Interactive Patient Education  2019 [employer]  

## 2018-09-09 NOTE — MAU Provider Note (Signed)
History     CSN: [id number]  Arrival date and time: 09/09/18 1404   First Provider Initiated Contact with Patient 09/09/18 1515      Chief Complaint  Patient presents with  . Contractions   HPI  Sherry Sheppard is a 33 y.o. female [phone] @ 8061w1d here with contractions. She says the contractions have been off and on for about a week. In the last 24 hours the contraptions have been stronger and more often. No vaginal bleeding, + fetal movement. No history of preterm deliveries. Her family voices that she has been SOB more often. She does not feel SOB. She says at times when she is up walking around she has occasional SOB, none now and none at rest. The SOB does not affect her night time sleep. No chest pain.    OB History    Gravida  4   Para  2   Term  2   Preterm      AB  1   Living  2     SAB  1   TAB      Ectopic      Multiple      Live Births  2           Past Medical History:  Diagnosis Date  . Allergic rhinitis   . Migraines   . Seizures (HCC)    last event 08/24/12  . Vaginal Pap smear, abnormal     Past Surgical History:  Procedure Laterality Date  . 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 CESAREAN SECTION  2009  . CESAREAN SECTION N/A 08/24/2012   Procedure: CESAREAN SECTION;  Surgeon: Kathreen CosierBernard A Marshall, MD;  Location: [facility];  Service: Obstetrics;  Laterality: N/A;  . DILATION AND EVACUATION  09/20/2011   Procedure: DILATATION AND EVACUATION;  Surgeon: Brock Badharles A Harper, MD;  Location: [facility];  Service: Gynecology;  Laterality: N/A;  . TOOTH EXTRACTION  2012    Family History  Problem Relation Age of Onset  . Hyperlipidemia Mother   . Diabetes Mother   . Hypertension Father   . Lung cancer Maternal Grandmother [id number]  . Hypertension Maternal Aunt   . Diabetes Cousin     Social History   Tobacco Use  . Smoking status: Never Smoker  . Smokeless tobacco: Never Used  Substance Use Topics  . Alcohol use: No   Alcohol/week: 0.0 standard drinks  . Drug use: No    Allergies:  Allergies  Allergen Reactions  . Banana Shortness Of Breath  . Other Nausea And Vomiting    Contrast dye  . Procardia [Nifedipine] Palpitations    Medications Prior to Admission  Medication Sig Dispense Refill Last Dose  . Acetaminophen (TYLENOL PO) Take by mouth.   Taking  . albuterol (PROVENTIL HFA;VENTOLIN HFA) 108 (90 Base) MCG/ACT inhaler Inhale 2 puffs into the lungs every 6 (six) hours as needed for wheezing or shortness of breath. (Patient not taking: Reported on 11/06/2017) 1 Inhaler 0 Not Taking  . butalbital-acetaminophen-caffeine (FIORICET, ESGIC) 50-325-40 MG tablet Take 2 tablets by mouth every 6 (six) hours as needed for headache or migraine. 30 tablet 2 Taking  . Cholecalciferol (VITAMIN D) 2000 units CAPS Take 1 capsule (2,000 Units total) by mouth daily before breakfast. 30 capsule 11 Taking  . cyclobenzaprine (FLEXERIL) 5 MG tablet Take 1 tablet (5 mg total) by mouth 3 (three) times daily as needed for muscle spasms. 30 tablet 2  Taking  . Doxylamine-Pyridoxine (DICLEGIS) 10-10 MG TBEC 1 tab in AM, 1 tab mid afternoon 2 tabs at bedtime. Max dose 4 tabs daily. 100 tablet 5 Taking  . Elastic Bandages & Supports (COMFORT FIT MATERNITY SUPP SM) MISC Wear as directed. 1 each 0 Taking  . Prenatal Vit-Fe Fumarate-FA (PRENATAL VITAMIN PLUS LOW IRON) 27-1 MG TABS TAKE 1 TABLET BY MOUTH DAILY BEFORE BREAKFAST. 30 tablet 11 Taking  . promethazine (PHENERGAN) 25 MG tablet Take 1 tablet (25 mg total) by mouth every 6 (six) hours as needed for nausea or vomiting. 30 tablet 2 Taking  . Vitamin D, Ergocalciferol, (DRISDOL) 50000 units CAPS capsule Take 1 capsule (50,000 Units total) by mouth every 7 (seven) days. (Patient not taking: Reported on 11/06/2017) 12 capsule 0 Not Taking   Results for orders placed or performed during the hospital encounter of 09/09/18 (from the past 48 hour(s))  Urinalysis, Routine w reflex  microscopic     Status: Abnormal   Collection Time: 09/09/18  3:00 PM  Result Value Ref Range   Color, Urine YELLOW YELLOW   APPearance HAZY (A) CLEAR   Specific Gravity, Urine 1.018 1.005 - 1.030   pH 6.0 5.0 - 8.0   Glucose, UA NEGATIVE NEGATIVE mg/dL   Hgb urine dipstick NEGATIVE NEGATIVE   Bilirubin Urine NEGATIVE NEGATIVE   Ketones, ur NEGATIVE NEGATIVE mg/dL   Protein, ur NEGATIVE NEGATIVE mg/dL   Nitrite NEGATIVE NEGATIVE   Leukocytes,Ua NEGATIVE NEGATIVE    Comment: Performed at [facility], [address] [place], [place] [address]   Review of Systems  Respiratory: Negative for shortness of breath.   Cardiovascular: Negative for chest pain and palpitations.  Gastrointestinal: Positive for abdominal pain.  Genitourinary: Negative for vaginal bleeding and vaginal discharge.   Physical Exam   Blood pressure 118/65, pulse (!) 124, temperature 99 F (37.2 C), temperature source Oral, resp. rate 18, height 5\' 5"  (1.651 m), weight 82.9 kg, last menstrual period 02/17/2018, SpO2 98 %.   Patient Vitals for the past 24 hrs:  BP Temp Temp src Pulse Resp SpO2 Height Weight  09/09/18 1843 118/65 - - (!) 124 18 98 % - -  09/09/18 1655 128/66 - - (!) 102 - - - -  09/09/18 1654 128/66 - - - - - - -  09/09/18 1634 110/60 - - - - - - -  09/09/18 1608 116/70 - - - - - - -  09/09/18 1435 121/78 99 F (37.2 C) Oral (!) 105 17 - - -  09/09/18 1418 - - - - - - 5\' 5"  (1.651 m) 82.9 kg    Physical Exam  Constitutional: She is oriented to person, place, and time. She appears well-developed and well-nourished. No distress.  HENT:  Head: Normocephalic.  Eyes: Pupils are equal, round, and reactive to light.  Genitourinary:    Genitourinary Comments: Dilation: Closed Effacement (%): Thick Cervical Position: Posterior Exam by:: Awilda Bill, NP   Musculoskeletal: Normal range of motion.  Neurological: She is alert and oriented to person, place, and time.  Skin: Skin is warm. She is  not diaphoretic.  Psychiatric: Her behavior is normal.    MAU Course  Procedures  None  MDM  Cerivix closed on exam: Procardia 10 mg Q20 min X 3 doses. Post procardia patient developed tachycardia and HA, Hr now in the 120's.  EKG: sinus tachycardia otherwise WNL  Tylenol 1 gram given PO and patient instructed to PO hydrated and she ordered dinner  Discussed patient with Dr. Alysia Penna.  Patient feels much better after eating and drinking fluids.  Cervical recheck prior to dc: Closed, thick    Assessment and Plan   A:  1. Threatened premature labor, antepartum   2. [redacted] weeks gestation of pregnancy   3. Braxton Hicks contractions   4. Tachycardia     P:  Discharge home In stable condition Return to MAU if symptoms worsen Increase oral fluid intake F/u with ob as scheduled  Venia Carbon I, NP 09/12/2018 8:30 AM

## 2018-09-18 ENCOUNTER — Other Ambulatory Visit: Payer: Self-pay

## 2018-09-18 ENCOUNTER — Ambulatory Visit (INDEPENDENT_AMBULATORY_CARE_PROVIDER_SITE_OTHER): Payer: 59 | Admitting: Obstetrics

## 2018-09-18 ENCOUNTER — Encounter: Payer: Self-pay | Admitting: Obstetrics

## 2018-09-18 DIAGNOSIS — Z3483 Encounter for supervision of other normal pregnancy, third trimester: Secondary | ICD-10-CM

## 2018-09-18 DIAGNOSIS — Z3A30 30 weeks gestation of pregnancy: Secondary | ICD-10-CM

## 2018-09-18 DIAGNOSIS — Z3480 Encounter for supervision of other normal pregnancy, unspecified trimester: Secondary | ICD-10-CM

## 2018-09-18 NOTE — Progress Notes (Signed)
Pt presents for ROB c/o swelling in ankles and feet x 2 days.

## 2018-09-18 NOTE — Progress Notes (Signed)
Subjective:  Sherry Sheppard is a 33 y.o. [phone] at [redacted]w[redacted]d being seen today for ongoing prenatal care.  She is currently monitored for the following issues for this low-risk pregnancy and has Seizures (HCC); AVM (arteriovenous malformation) brain; Allergic rhinitis; Nondisplaced fracture of first metatarsal bone, right foot, initial encounter for closed fracture; Supervision of other normal pregnancy, antepartum; and History of cesarean delivery on their problem list.  Patient reports no complaints.  Contractions: Irritability. Vag. Bleeding: None.  Movement: Present. Denies leaking of fluid.   The following portions of the patient's history were reviewed and updated as appropriate: allergies, current medications, past family history, past medical history, past social history, past surgical history and problem list. Problem list updated.  Objective:   Vitals:   09/18/18 0815  BP: 116/74  Pulse: (!) 116  Weight: 184 lb 12.8 oz (83.8 kg)    Fetal Status:     Movement: Present     General:  Alert, oriented and cooperative. Patient is in no acute distress.  Skin: Skin is warm and dry. No rash noted.   Cardiovascular: Normal heart rate noted  Respiratory: Normal respiratory effort, no problems with respiration noted  Abdomen: Soft, gravid, appropriate for gestational age. Pain/Pressure: Present     Pelvic:  Cervical exam deferred        Extremities: Normal range of motion.  Edema: Moderate pitting, indentation subsides rapidly  Mental Status: Normal mood and affect. Normal behavior. Normal judgment and thought content.   Urinalysis:      Assessment and Plan:  Pregnancy: [id number] at [redacted]w[redacted]d  1. Supervision of other normal pregnancy, antepartum   Preterm labor symptoms and general obstetric precautions including but not limited to vaginal bleeding, contractions, leaking of fluid and fetal movement were reviewed in detail with the patient. Please refer to After Visit Summary for other  counseling recommendations.  Return in about 2 weeks (around 10/02/2018) for ROB.   Brock Bad, MD

## 2018-09-20 ENCOUNTER — Encounter: Payer: 59 | Admitting: Obstetrics

## 2018-09-27 ENCOUNTER — Other Ambulatory Visit: Payer: Self-pay

## 2018-09-27 DIAGNOSIS — Z3483 Encounter for supervision of other normal pregnancy, third trimester: Secondary | ICD-10-CM

## 2018-09-30 ENCOUNTER — Ambulatory Visit (INDEPENDENT_AMBULATORY_CARE_PROVIDER_SITE_OTHER): Payer: 59 | Admitting: Obstetrics

## 2018-09-30 ENCOUNTER — Other Ambulatory Visit: Payer: Self-pay

## 2018-09-30 VITALS — BP 113/75 | HR 110 | Resp 18 | Ht 65.5 in | Wt 187.9 lb

## 2018-09-30 DIAGNOSIS — Z3A32 32 weeks gestation of pregnancy: Secondary | ICD-10-CM

## 2018-09-30 DIAGNOSIS — O99019 Anemia complicating pregnancy, unspecified trimester: Secondary | ICD-10-CM | POA: Diagnosis not present

## 2018-09-30 DIAGNOSIS — R300 Dysuria: Secondary | ICD-10-CM | POA: Diagnosis not present

## 2018-09-30 DIAGNOSIS — R3915 Urgency of urination: Secondary | ICD-10-CM | POA: Diagnosis not present

## 2018-09-30 DIAGNOSIS — O99013 Anemia complicating pregnancy, third trimester: Secondary | ICD-10-CM

## 2018-09-30 DIAGNOSIS — O26893 Other specified pregnancy related conditions, third trimester: Secondary | ICD-10-CM

## 2018-09-30 LAB — POCT URINALYSIS DIPSTICK OB
Bilirubin, UA: NEGATIVE
Blood, UA: NEGATIVE
Glucose, UA: NEGATIVE
Ketones, UA: NEGATIVE
Nitrite, UA: NEGATIVE
Odor: POSITIVE
Spec Grav, UA: 1.01 (ref 1.010–1.025)
Urobilinogen, UA: 0.2 E.U./dL
pH, UA: 7 (ref 5.0–8.0)

## 2018-09-30 MED ORDER — CEFUROXIME AXETIL 500 MG PO TABS: 500.0000 mg | ORAL_TABLET | Freq: Two times a day (BID) | ORAL | 0 refills | Status: DC

## 2018-09-30 MED FILL — CEFUROXIME AXETIL 500 MG TA: 500 | 7 days supply | Qty: 14 | Fill #0

## 2018-09-30 NOTE — Progress Notes (Signed)
Subjective:  Sherry Sheppard is a 33 y.o. [phone] at [redacted]w[redacted]d being seen today for ongoing prenatal care.  She is currently monitored for the following issues for this low-risk pregnancy and has Seizures (HCC); AVM (arteriovenous malformation) brain; Allergic rhinitis; Nondisplaced fracture of first metatarsal bone, right foot, initial encounter for closed fracture; Supervision of other normal pregnancy, antepartum; and History of cesarean delivery on their problem list.  Patient reports no complaints.  Contractions: Irregular.  .  Movement: Present. Denies leaking of fluid.   The following portions of the patient's history were reviewed and updated as appropriate: allergies, current medications, past family history, past medical history, past social history, past surgical history and problem list. Problem list updated.  Objective:   Vitals:   09/30/18 1013  BP: 113/75  Pulse: (!) 110  Resp: 18  Weight: 187 lb 14.4 oz (85.2 kg)  Height: 5' 5.5" (1.664 m)    Fetal Status:     Movement: Present     General:  Alert, oriented and cooperative. Patient is in no acute distress.  Skin: Skin is warm and dry. No rash noted.   Cardiovascular: Normal heart rate noted  Respiratory: Normal respiratory effort, no problems with respiration noted  Abdomen: Soft, gravid, appropriate for gestational age. Pain/Pressure: Present     Pelvic:  Cervical exam deferred        Extremities: Normal range of motion.     Mental Status: Normal mood and affect. Normal behavior. Normal judgment and thought content.   Urinalysis:      Assessment and Plan:  Pregnancy: [id number] at [redacted]w[redacted]d  1. Urgency of urination- Culture, OB Urine  2. Dysuria Rx: - Culture, OB Urine - POC Urinalysis Dipstick OB - cefUROXime (CEFTIN) 500 MG tablet; Take 1 tablet (500 mg total) by mouth 2 (two) times daily with a meal.  Dispense: 14 tablet; Refill: 0  Preterm labor symptoms and general obstetric precautions including but not  limited to vaginal bleeding, contractions, leaking of fluid and fetal movement were reviewed in detail with the patient. Please refer to After Visit Summary for other counseling recommendations.  Return if symptoms worsen or fail to improve.   Brock Bad, MD

## 2018-09-30 NOTE — Addendum Note (Signed)
Addended by: Coral Ceo A on: 09/30/2018 10:59 AM   Modules accepted: Orders, Level of Service

## 2018-09-30 NOTE — Progress Notes (Signed)
SUBJECTIVE: Sherry Sheppard is a 33 y.o. female who complains of urinary  urgency and dysuria x 2 days, without flank pain, fever, chills, or abnormal vaginal discharge or bleeding.   OBJECTIVE: Appears well, in no apparent distress.  Vital signs are normal. Urine dipstick shows positive for protein and positive for leukocytes.    ASSESSMENT: Dysuria  PLAN: Treatment per orders.  Call or return to clinic prn if these symptoms worsen or fail to improve as anticipated.

## 2018-10-01 LAB — FERRITIN: Ferritin: 25 ng/mL (ref 15–150)

## 2018-10-02 ENCOUNTER — Ambulatory Visit: Payer: 59

## 2018-10-02 LAB — CULTURE, OB URINE

## 2018-10-02 LAB — URINE CULTURE, OB REFLEX

## 2018-10-03 DIAGNOSIS — Z3483 Encounter for supervision of other normal pregnancy, third trimester: Secondary | ICD-10-CM | POA: Diagnosis not present

## 2018-10-03 DIAGNOSIS — Z3482 Encounter for supervision of other normal pregnancy, second trimester: Secondary | ICD-10-CM | POA: Diagnosis not present

## 2018-10-04 ENCOUNTER — Encounter (INDEPENDENT_AMBULATORY_CARE_PROVIDER_SITE_OTHER): Payer: 59 | Admitting: Obstetrics

## 2018-10-16 ENCOUNTER — Other Ambulatory Visit: Payer: Self-pay

## 2018-10-16 ENCOUNTER — Ambulatory Visit (INDEPENDENT_AMBULATORY_CARE_PROVIDER_SITE_OTHER): Payer: 59 | Admitting: Obstetrics and Gynecology

## 2018-10-16 ENCOUNTER — Encounter: Payer: Self-pay | Admitting: Obstetrics and Gynecology

## 2018-10-16 ENCOUNTER — Encounter: Payer: 59 | Admitting: Obstetrics and Gynecology

## 2018-10-16 VITALS — BP 113/73 | HR 86 | Wt 191.0 lb

## 2018-10-16 DIAGNOSIS — Z3A34 34 weeks gestation of pregnancy: Secondary | ICD-10-CM

## 2018-10-16 DIAGNOSIS — Z3480 Encounter for supervision of other normal pregnancy, unspecified trimester: Secondary | ICD-10-CM

## 2018-10-16 DIAGNOSIS — Q282 Arteriovenous malformation of cerebral vessels: Secondary | ICD-10-CM

## 2018-10-16 DIAGNOSIS — Z98891 History of uterine scar from previous surgery: Secondary | ICD-10-CM

## 2018-10-16 DIAGNOSIS — Z3483 Encounter for supervision of other normal pregnancy, third trimester: Secondary | ICD-10-CM

## 2018-10-16 NOTE — Progress Notes (Signed)
Pt would like to discuss date for C/S, need to recheck hemoglobin/Ferritin. Pt states that she is still having pressure, ctx and swelling.  Vitals today: 113/73 Pulse 86 Temp 98.2 Weight 191lb

## 2018-10-16 NOTE — Progress Notes (Signed)
   TELEHEALTH VIRTUAL OBSTETRICS VISIT ENCOUNTER NOTE  I connected with Sherry Sheppard on 10/16/18 at  4:15 PM EDT by telephone at home and verified that I am speaking with the correct person using two identifiers.   I discussed the limitations, risks, security and privacy concerns of performing an evaluation and management service by telephone and the availability of in person appointments. I also discussed with the patient that there may be a patient responsible charge related to this service. The patient expressed understanding and agreed to proceed.  Subjective:  Sherry Sheppard is a 33 y.o. [id number] at [redacted]w[redacted]d being followed for ongoing prenatal care.  She is currently monitored for the following issues for this high-risk pregnancy and has Seizures (HCC); AVM (arteriovenous malformation) brain; Allergic rhinitis; Supervision of other normal pregnancy, antepartum; and History of cesarean delivery on their problem list.  Patient reports general discomforts of pregnancy. Reports fetal movement. Denies any contractions, bleeding or leaking of fluid.   The following portions of the patient's history were reviewed and updated as appropriate: allergies, current medications, past family history, past medical history, past social history, past surgical history and problem list.   Objective:   General:  Alert, oriented and cooperative.   Mental Status: Normal mood and affect perceived. Normal judgment and thought content.  Rest of physical exam deferred due to type of encounter  Assessment and Plan:  Pregnancy: [id number] at [redacted]w[redacted]d 1. Supervision of other normal pregnancy, antepartum Stable Vaginal cultures next visit  2. History of cesarean delivery For repeat at 39 weeks  3. AVM (arteriovenous malformation) brain Anesthesia consult ordered  Preterm labor symptoms and general obstetric precautions including but not limited to vaginal bleeding, contractions, leaking of fluid and fetal  movement were reviewed in detail with the patient.  I discussed the assessment and treatment plan with the patient. The patient was provided an opportunity to ask questions and all were answered. The patient agreed with the plan and demonstrated an understanding of the instructions. The patient was advised to call back or seek an in-person office evaluation/go to MAU at [facility] for any urgent or concerning symptoms. Please refer to After Visit Summary for other counseling recommendations.   I provided 11 minutes of non-face-to-face time during this encounter.  Return for OB visit face to face for vaginal cultures.  Future Appointments  Date Time Provider Department Center  10/31/2018  4:00 PM Hermina Staggers, MD [facility] None    Hermina Staggers, MD Center for [facility], [facility]

## 2018-10-17 ENCOUNTER — Encounter: Payer: 59 | Admitting: Certified Nurse Midwife

## 2018-10-17 ENCOUNTER — Encounter: Payer: Self-pay | Admitting: Obstetrics

## 2018-10-17 ENCOUNTER — Other Ambulatory Visit: Payer: Self-pay | Admitting: Obstetrics

## 2018-10-17 DIAGNOSIS — K029 Dental caries, unspecified: Secondary | ICD-10-CM

## 2018-10-17 MED ORDER — AMOXICILLIN-POT CLAVULANATE 875-125 MG PO TABS: 1.0000 | ORAL_TABLET | Freq: Two times a day (BID) | ORAL | 1 refills | Status: DC

## 2018-10-17 MED FILL — AMOX-CLAV 875-125 MG TABLET: 875-125 | 10 days supply | Qty: 20 | Fill #0

## 2018-10-29 ENCOUNTER — Inpatient Hospital Stay (HOSPITAL_COMMUNITY)
Admission: AD | Admit: 2018-10-29 | Discharge: 2018-10-29 | Disposition: A | Discharge status: Home / Self Care | Payer: 59 | Attending: Obstetrics and Gynecology | Admitting: Obstetrics and Gynecology

## 2018-10-29 ENCOUNTER — Encounter (HOSPITAL_COMMUNITY): Payer: Self-pay | Admitting: *Deleted

## 2018-10-29 ENCOUNTER — Other Ambulatory Visit: Payer: Self-pay

## 2018-10-29 DIAGNOSIS — O4703 False labor before 37 completed weeks of gestation, third trimester: Secondary | ICD-10-CM | POA: Diagnosis not present

## 2018-10-29 LAB — URINALYSIS, ROUTINE W REFLEX MICROSCOPIC
Bilirubin Urine: NEGATIVE
Glucose, UA: NEGATIVE mg/dL
Hgb urine dipstick: NEGATIVE
Ketones, ur: NEGATIVE mg/dL
Leukocytes,Ua: NEGATIVE
Nitrite: NEGATIVE
Protein, ur: NEGATIVE mg/dL
Specific Gravity, Urine: 1.006 (ref 1.005–1.030)
pH: 7 (ref 5.0–8.0)

## 2018-10-29 NOTE — Discharge Instructions (Signed)
Braxton Hicks Contractions Contractions of the uterus can occur throughout pregnancy, but they are not always a sign that you are in labor. You may have practice contractions called Braxton Hicks contractions. These false labor contractions are sometimes confused with true labor. What are Braxton Hicks contractions? Braxton Hicks contractions are tightening movements that occur in the muscles of the uterus before labor. Unlike true labor contractions, these contractions do not result in opening (dilation) and thinning of the cervix. Toward the end of pregnancy (32-34 weeks), Braxton Hicks contractions can happen more often and may become stronger. These contractions are sometimes difficult to tell apart from true labor because they can be very uncomfortable. You should not feel embarrassed if you go to the hospital with false labor. Sometimes, the only way to tell if you are in true labor is for your health care provider to look for changes in the cervix. The health care provider will do a physical exam and may monitor your contractions. If you are not in true labor, the exam should show that your cervix is not dilating and your water has not broken. If there are no other health problems associated with your pregnancy, it is completely safe for you to be sent home with false labor. You may continue to have Braxton Hicks contractions until you go into true labor. How to tell the difference between true labor and false labor True labor  Contractions last 30-70 seconds.  Contractions become very regular.  Discomfort is usually felt in the top of the uterus, and it spreads to the lower abdomen and low back.  Contractions do not go away with walking.  Contractions usually become more intense and increase in frequency.  The cervix dilates and gets thinner. False labor  Contractions are usually shorter and not as strong as true labor contractions.  Contractions are usually irregular.  Contractions  are often felt in the front of the lower abdomen and in the groin.  Contractions may go away when you walk around or change positions while lying down.  Contractions get weaker and are shorter-lasting as time goes on.  The cervix usually does not dilate or become thin. Follow these instructions at home:   Take over-the-counter and prescription medicines only as told by your health care provider.  Keep up with your usual exercises and follow other instructions from your health care provider.  Eat and drink lightly if you think you are going into labor.  If Braxton Hicks contractions are making you uncomfortable: ? Change your position from lying down or resting to walking, or change from walking to resting. ? Sit and rest in a tub of warm water. ? Drink enough fluid to keep your urine pale yellow. Dehydration may cause these contractions. ? Do slow and deep breathing several times an hour.  Keep all follow-up prenatal visits as told by your health care provider. This is important. Contact a health care provider if:  You have a fever.  You have continuous pain in your abdomen. Get help right away if:  Your contractions become stronger, more regular, and closer together.  You have fluid leaking or gushing from your vagina.  You pass blood-tinged mucus (bloody show).  You have bleeding from your vagina.  You have low back pain that you never had before.  You feel your baby's head pushing down and causing pelvic pressure.  Your baby is not moving inside you as much as it used to. Summary  Contractions that occur before labor are   called Braxton Hicks contractions, false labor, or practice contractions.  Braxton Hicks contractions are usually shorter, weaker, farther apart, and less regular than true labor contractions. True labor contractions usually become progressively stronger and regular, and they become more frequent.  Manage discomfort from Braxton Hicks contractions  by changing position, resting in a warm bath, drinking plenty of water, or practicing deep breathing. This information is not intended to replace advice given to you by your health care provider. Make sure you discuss any questions you have with your health care provider. Document Released: 11/09/2016 Document Revised: 04/10/2017 Document Reviewed: 11/09/2016 Elsevier Interactive Patient Education  2019 [employer]  

## 2018-10-29 NOTE — Progress Notes (Signed)
Sherry Sheppard, CNM notified of pt pulse elevation prior to discharge.  CNM states pt may go home, pulse during previous visits approximately the same

## 2018-10-29 NOTE — MAU Note (Signed)
I have communicated with Dr. Aneta Mins and reviewed vital signs:  Vitals:   10/29/18 1100  BP: 103/70  Pulse: (!) 123  Resp: 20  Temp: 98.3 F (36.8 C)  SpO2: 100%    Vaginal exam:  Dilation: Closed Exam by:: F. Reana Chacko, RNC,   Also reviewed contraction pattern and that non-stress test is reactive.  It has been documented that patient is contracting once in 25 minutes with a closed cervix not indicating active labor.  Patient denies any other complaints.  Based on this report provider has given order for discharge.  A discharge order and diagnosis entered by a provider.   Labor discharge instructions reviewed with patient.

## 2018-10-29 NOTE — MAU Note (Signed)
Presents with c/o ctxs every 11 minutes since 0900 thiam.  Denies VB or LOF.  Reports +FM.  Reports scheduled for repeat Cesarean on Nov 17, 2018.

## 2018-10-30 DIAGNOSIS — B370 Candidal stomatitis: Secondary | ICD-10-CM

## 2018-10-30 MED ORDER — NYSTATIN 100000 UNIT/ML MT SUSP: OROMUCOSAL | 1 refills | Status: DC

## 2018-10-30 MED FILL — NYSTATIN 100,000 UNITS/ML S: 100000 | 3 days supply | Qty: 60 | Fill #0

## 2018-10-31 ENCOUNTER — Ambulatory Visit (INDEPENDENT_AMBULATORY_CARE_PROVIDER_SITE_OTHER): Payer: 59 | Admitting: Obstetrics and Gynecology

## 2018-10-31 ENCOUNTER — Other Ambulatory Visit: Payer: Self-pay

## 2018-10-31 ENCOUNTER — Encounter: Payer: Self-pay | Admitting: Obstetrics and Gynecology

## 2018-10-31 VITALS — BP 117/73 | HR 98 | Wt 192.9 lb

## 2018-10-31 DIAGNOSIS — Z3483 Encounter for supervision of other normal pregnancy, third trimester: Secondary | ICD-10-CM

## 2018-10-31 DIAGNOSIS — N898 Other specified noninflammatory disorders of vagina: Secondary | ICD-10-CM

## 2018-10-31 DIAGNOSIS — Z3480 Encounter for supervision of other normal pregnancy, unspecified trimester: Secondary | ICD-10-CM

## 2018-10-31 DIAGNOSIS — Z113 Encounter for screening for infections with a predominantly sexual mode of transmission: Secondary | ICD-10-CM | POA: Diagnosis not present

## 2018-10-31 DIAGNOSIS — Z98891 History of uterine scar from previous surgery: Secondary | ICD-10-CM

## 2018-10-31 DIAGNOSIS — Q282 Arteriovenous malformation of cerebral vessels: Secondary | ICD-10-CM

## 2018-10-31 DIAGNOSIS — Z3A36 36 weeks gestation of pregnancy: Secondary | ICD-10-CM

## 2018-10-31 NOTE — Progress Notes (Signed)
Patient reports fetal movement with irregular contractions. 

## 2018-10-31 NOTE — Progress Notes (Signed)
Subjective:  Sherry Sheppard is a 33 y.o. [phone] at [redacted]w[redacted]d being seen today for ongoing prenatal care.  She is currently monitored for the following issues for this high-risk pregnancy and has Seizures (HCC); AVM (arteriovenous malformation) brain; Allergic rhinitis; Supervision of other normal pregnancy, antepartum; and History of cesarean delivery on their problem list.  Patient reports general discomforts of pregnancy.  Contractions: Irregular. Vag. Bleeding: None.  Movement: Present. Denies leaking of fluid.   The following portions of the patient's history were reviewed and updated as appropriate: allergies, current medications, past family history, past medical history, past social history, past surgical history and problem list. Problem list updated.  Objective:   Vitals:   10/31/18 1606  BP: 117/73  Pulse: 98  Weight: 192 lb 14.4 oz (87.5 kg)    Fetal Status: Fetal Heart Rate (bpm): 146   Movement: Present     General:  Alert, oriented and cooperative. Patient is in no acute distress.  Skin: Skin is warm and dry. No rash noted.   Cardiovascular: Normal heart rate noted  Respiratory: Normal respiratory effort, no problems with respiration noted  Abdomen: Soft, gravid, appropriate for gestational age. Pain/Pressure: Present     Pelvic:  Cervical exam performed        Extremities: Normal range of motion.  Edema: Trace  Mental Status: Normal mood and affect. Normal behavior. Normal judgment and thought content.   Urinalysis:      Assessment and Plan:  Pregnancy: [id number] at [redacted]w[redacted]d  1. Supervision of other normal pregnancy, antepartum Stable - Culture, beta strep (group b only) - Cervicovaginal ancillary only( [place]) - Babyscripts Schedule Optimization  2. History of cesarean delivery For repeat at 39 weeks Cleared by anesthesia  3. AVM (arteriovenous malformation) brain As noted above  Preterm labor symptoms and general obstetric precautions including but not  limited to vaginal bleeding, contractions, leaking of fluid and fetal movement were reviewed in detail with the patient. Please refer to After Visit Summary for other counseling recommendations.  Return in about 2 weeks (around 11/14/2018) for OB visit, televisit.   Hermina Staggers, MD

## 2018-10-31 NOTE — Patient Instructions (Signed)

## 2018-11-04 LAB — CULTURE, BETA STREP (GROUP B ONLY): Strep Gp B Culture: NEGATIVE

## 2018-11-05 ENCOUNTER — Telehealth (HOSPITAL_COMMUNITY): Payer: Self-pay | Admitting: Lactation Services

## 2018-11-05 ENCOUNTER — Encounter (HOSPITAL_COMMUNITY): Payer: Self-pay

## 2018-11-05 LAB — CERVICOVAGINAL ANCILLARY ONLY
Bacterial vaginitis: NEGATIVE
Candida vaginitis: NEGATIVE
Chlamydia: NEGATIVE
Neisseria Gonorrhea: NEGATIVE
Trichomonas: NEGATIVE

## 2018-11-05 MED FILL — NYSTATIN 100,000 UNITS/ML S: 100000 | 3 days supply | Qty: 60 | Fill #1

## 2018-11-05 NOTE — Telephone Encounter (Signed)
Called to follow up with Pt phone call in regards to Oral Thrush.   Mrs. Sayres reports she took Augmentin for a few days for tooth pain. She stopped the medication early. She then noticed a white coating on her tongue and in her cheeks. She was prescribed Nystatin Suspension to take 5 ml QID 3 days ago. She reports she still has some white on her tongue but is much better now. She is getting a refill today to continue the medication.   Discussed good handwashing with eating or if touching face and toileting. Pt scheduled for c/s on 5/10. Discussed with pt that infection should be cleared by then. Discussed not kissing family members while being treated, changing out toothbrush and good handwashing for now. Discussed if she still has when infant is born then the IP Lactation consultants can give her direction on treating her breasts if needed.   Mom is concerned yeast will get into her milk through her body. Discussed this is less likely and if transferred to breast or milk would most likely happen by hands or through the yeast on her skin.. We discussed S/S of yeast to nipples and intraductal, mom does not have any breast signs at this time.   Pt to call back with questions/concerns as needed.

## 2018-11-05 NOTE — Patient Instructions (Signed)
Sherry Sheppard  11/05/2018   Your procedure is scheduled on:  11/17/2018  Arrive at 0700 at Entrance C on [employer] at [facility]  and [employer]. You are invited to use the FREE valet parking or use the Visitor's parking deck.  Pick up the phone at the desk and dial [phone].  Call this number if you have problems the morning of surgery: [phone]  Remember:   Do not eat food:(After Midnight) Desps de medianoche.  Do not drink clear liquids: (After Midnight) Desps de medianoche.  Take these medicines the morning of surgery with A SIP OF WATER:  none   Do not wear jewelry, make-up or nail polish.  Do not wear lotions, powders, or perfumes. Do not wear deodorant.  Do not shave 48 hours prior to surgery.  Do not bring valuables to the hospital.  [facility] is not   responsible for any belongings or valuables brought to the hospital.  Contacts, dentures or bridgework may not be worn into surgery.  Leave suitcase in the car. After surgery it may be brought to your room.  For patients admitted to the hospital, checkout time is 11:00 AM the day of              discharge.      Please read over the following fact sheets that you were given:     Preparing for Surgery

## 2018-11-05 NOTE — Telephone Encounter (Signed)
Mom called in to say she has been on Augmentin and got Oral thrush and is now being treated with Oral Nystatin. Mom is concerned that she can transfer to infant who is due soon.

## 2018-11-07 ENCOUNTER — Other Ambulatory Visit: Payer: Self-pay

## 2018-11-07 ENCOUNTER — Encounter: Payer: Self-pay | Admitting: Obstetrics

## 2018-11-07 ENCOUNTER — Ambulatory Visit (INDEPENDENT_AMBULATORY_CARE_PROVIDER_SITE_OTHER): Payer: 59 | Admitting: Obstetrics

## 2018-11-07 VITALS — BP 120/76 | HR 116

## 2018-11-07 DIAGNOSIS — Z3480 Encounter for supervision of other normal pregnancy, unspecified trimester: Secondary | ICD-10-CM

## 2018-11-07 DIAGNOSIS — Z3482 Encounter for supervision of other normal pregnancy, second trimester: Secondary | ICD-10-CM

## 2018-11-07 DIAGNOSIS — Z3A37 37 weeks gestation of pregnancy: Secondary | ICD-10-CM

## 2018-11-07 DIAGNOSIS — Z98891 History of uterine scar from previous surgery: Secondary | ICD-10-CM

## 2018-11-07 DIAGNOSIS — Q282 Arteriovenous malformation of cerebral vessels: Secondary | ICD-10-CM

## 2018-11-07 NOTE — Progress Notes (Signed)
   TELEHEALTH VIRTUAL OBSTETRICS PRENATAL VISIT ENCOUNTER NOTE  I connected with Sherry Sheppard on 11/07/18 at  1:30 PM EDT by WebEx at home and verified that I am speaking with the correct person using two identifiers.   I discussed the limitations, risks, security and privacy concerns of performing an evaluation and management service by telephone and the availability of in person appointments. I also discussed with the patient that there may be a patient responsible charge related to this service. The patient expressed understanding and agreed to proceed. Subjective:  Sherry Sheppard is a 33 y.o. [phone] at [redacted]w[redacted]d being seen today for ongoing prenatal care.  She is currently monitored for the following issues for this high-risk pregnancy and has Seizures (HCC); AVM (arteriovenous malformation) brain; Allergic rhinitis; Supervision of other normal pregnancy, antepartum; and History of cesarean delivery on their problem list.  Patient reports no complaints.  Reports fetal movement. Contractions: Irregular. Vag. Bleeding: None.  Movement: Present. Denies any contractions, bleeding or leaking of fluid.   The following portions of the patient's history were reviewed and updated as appropriate: allergies, current medications, past family history, past medical history, past social history, past surgical history and problem list.   Objective:  There were no vitals filed for this visit.  Fetal Status:     Movement: Present     General:  Alert, oriented and cooperative. Patient is in no acute distress.  Respiratory: Normal respiratory effort, no problems with respiration noted  Mental Status: Normal mood and affect. Normal behavior. Normal judgment and thought content.  Rest of physical exam deferred due to type of encounter  Assessment and Plan:  Pregnancy: [id number] at [redacted]w[redacted]d 1. Supervision of other normal pregnancy, antepartum  2. History of cesarean delivery  3. AVM (arteriovenous  malformation) brain - clinically stable  Term labor symptoms and general obstetric precautions including but not limited to vaginal bleeding, contractions, leaking of fluid and fetal movement were reviewed in detail with the patient. I discussed the assessment and treatment plan with the patient. The patient was provided an opportunity to ask questions and all were answered. The patient agreed with the plan and demonstrated an understanding of the instructions. The patient was advised to call back or seek an in-person office evaluation/go to MAU at [facility] for any urgent or concerning symptoms. Please refer to After Visit Summary for other counseling recommendations.   I provided 10 minutes of face-to-face via WebEx time during this encounter.  Return in about 1 week (around 11/14/2018) for [facility].  Future Appointments  Date Time Provider Department Center  11/15/2018  9:15 AM [facility] PAT 1 [facility]INDC None    Coral Ceo, MD Center for [facility], [facility] 11-07-2018

## 2018-11-07 NOTE — Progress Notes (Signed)
Pt is on the phone preparing for Webex visit with provider. [redacted]w[redacted]d. Pt does not have BP cuff at home, pt denies any HA or blurry vision.

## 2018-11-09 ENCOUNTER — Other Ambulatory Visit: Payer: Self-pay | Admitting: Obstetrics

## 2018-11-09 DIAGNOSIS — B370 Candidal stomatitis: Secondary | ICD-10-CM

## 2018-11-09 MED ORDER — FLUCONAZOLE 100 MG PO TABS: 100.0000 mg | ORAL_TABLET | Freq: Every day | ORAL | 2 refills | Status: DC

## 2018-11-09 MED FILL — FLUCONAZOLE 100 MG TAB: 100 | 14 days supply | Qty: 14 | Fill #0

## 2018-11-10 ENCOUNTER — Inpatient Hospital Stay (HOSPITAL_COMMUNITY): Payer: 59 | Admitting: Anesthesiology

## 2018-11-10 ENCOUNTER — Encounter (HOSPITAL_COMMUNITY)
Admission: AD | Disposition: A | Discharge status: Home / Self Care | Payer: Self-pay | Source: Home / Self Care | Attending: Obstetrics and Gynecology

## 2018-11-10 ENCOUNTER — Inpatient Hospital Stay (HOSPITAL_COMMUNITY)
Admission: AD | Admit: 2018-11-10 | Discharge: 2018-11-12 | DRG: 788 | Disposition: A | Discharge status: Home / Self Care | Payer: 59 | Attending: Obstetrics and Gynecology | Admitting: Obstetrics and Gynecology

## 2018-11-10 ENCOUNTER — Inpatient Hospital Stay (HOSPITAL_COMMUNITY): Payer: 59

## 2018-11-10 ENCOUNTER — Other Ambulatory Visit: Payer: Self-pay

## 2018-11-10 ENCOUNTER — Encounter (HOSPITAL_COMMUNITY): Payer: Self-pay

## 2018-11-10 DIAGNOSIS — Z3A38 38 weeks gestation of pregnancy: Secondary | ICD-10-CM | POA: Diagnosis not present

## 2018-11-10 DIAGNOSIS — O34211 Maternal care for low transverse scar from previous cesarean delivery: Principal | ICD-10-CM | POA: Diagnosis present

## 2018-11-10 DIAGNOSIS — Q282 Arteriovenous malformation of cerebral vessels: Secondary | ICD-10-CM

## 2018-11-10 DIAGNOSIS — O26893 Other specified pregnancy related conditions, third trimester: Secondary | ICD-10-CM | POA: Diagnosis not present

## 2018-11-10 DIAGNOSIS — Z87898 Personal history of other specified conditions: Secondary | ICD-10-CM

## 2018-11-10 DIAGNOSIS — R569 Unspecified convulsions: Secondary | ICD-10-CM

## 2018-11-10 DIAGNOSIS — O288 Other abnormal findings on antenatal screening of mother: Secondary | ICD-10-CM | POA: Diagnosis present

## 2018-11-10 DIAGNOSIS — Z98891 History of uterine scar from previous surgery: Secondary | ICD-10-CM

## 2018-11-10 LAB — CBC
HCT: 36.2 % (ref 36.0–46.0)
Hemoglobin: 11.4 g/dL — ABNORMAL LOW (ref 12.0–15.0)
MCH: 26 pg (ref 26.0–34.0)
MCHC: 31.5 g/dL (ref 30.0–36.0)
MCV: 82.6 fL (ref 80.0–100.0)
Platelets: 279 10*3/uL (ref 150–400)
RBC: 4.38 MIL/uL (ref 3.87–5.11)
RDW: 14.4 % (ref 11.5–15.5)
WBC: 9.1 10*3/uL (ref 4.0–10.5)
nRBC: 0 % (ref 0.0–0.2)

## 2018-11-10 SURGERY — Surgical Case
Anesthesia: Spinal

## 2018-11-10 MED ORDER — ACETAMINOPHEN 325 MG PO TABS
650.0000 mg | ORAL_TABLET | ORAL | Status: DC | PRN
  Administered 2018-11-10 – 2018-11-12 (×4): 650 mg via ORAL
  Filled 2018-11-10 (×4): qty 2

## 2018-11-10 MED ORDER — LACTATED RINGERS IV SOLN
INTRAVENOUS | Status: DC
  Administered 2018-11-10 (×2): via INTRAVENOUS

## 2018-11-10 MED ORDER — METOCLOPRAMIDE HCL 5 MG/ML IJ SOLN
INTRAMUSCULAR | Status: AC
  Filled 2018-11-10: qty 2

## 2018-11-10 MED ORDER — SIMETHICONE 80 MG PO CHEW
80.0000 mg | CHEWABLE_TABLET | ORAL | Status: DC | PRN
  Administered 2018-11-11: 80 mg via ORAL

## 2018-11-10 MED ORDER — ONDANSETRON HCL 4 MG/2ML IJ SOLN
4.0000 mg | Freq: Three times a day (TID) | INTRAMUSCULAR | Status: DC | PRN
  Administered 2018-11-10: 4 mg via INTRAVENOUS
  Filled 2018-11-10: qty 2

## 2018-11-10 MED ORDER — FENTANYL CITRATE (PF) 100 MCG/2ML IJ SOLN
INTRAMUSCULAR | Status: DC | PRN
  Administered 2018-11-10: 15 ug via INTRATHECAL

## 2018-11-10 MED ORDER — SIMETHICONE 80 MG PO CHEW
80.0000 mg | CHEWABLE_TABLET | ORAL | Status: DC
  Administered 2018-11-10 – 2018-11-11 (×2): 80 mg via ORAL
  Filled 2018-11-10 (×2): qty 1

## 2018-11-10 MED ORDER — MORPHINE SULFATE (PF) 0.5 MG/ML IJ SOLN
INTRAMUSCULAR | Status: AC
  Filled 2018-11-10: qty 10

## 2018-11-10 MED ORDER — DIBUCAINE (PERIANAL) 1 % EX OINT: 1.0000 "application " | TOPICAL_OINTMENT | CUTANEOUS | Status: DC | PRN

## 2018-11-10 MED ORDER — ZOLPIDEM TARTRATE 5 MG PO TABS: 5.0000 mg | ORAL_TABLET | Freq: Every evening | ORAL | Status: DC | PRN

## 2018-11-10 MED ORDER — SIMETHICONE 80 MG PO CHEW
80.0000 mg | CHEWABLE_TABLET | Freq: Three times a day (TID) | ORAL | Status: DC
  Administered 2018-11-10 – 2018-11-12 (×4): 80 mg via ORAL
  Filled 2018-11-10 (×5): qty 1

## 2018-11-10 MED ORDER — CEFAZOLIN SODIUM-DEXTROSE 2-4 GM/100ML-% IV SOLN
2.0000 g | INTRAVENOUS | Status: AC
  Administered 2018-11-10: 2 g via INTRAVENOUS
  Filled 2018-11-10: qty 100

## 2018-11-10 MED ORDER — SCOPOLAMINE 1 MG/3DAYS TD PT72
MEDICATED_PATCH | TRANSDERMAL | Status: DC | PRN
  Administered 2018-11-10: 1 via TRANSDERMAL

## 2018-11-10 MED ORDER — ONDANSETRON HCL 4 MG/2ML IJ SOLN
INTRAMUSCULAR | Status: DC | PRN
  Administered 2018-11-10: 4 mg via INTRAVENOUS

## 2018-11-10 MED ORDER — CEFAZOLIN SODIUM-DEXTROSE 2-4 GM/100ML-% IV SOLN
INTRAVENOUS | Status: AC
  Filled 2018-11-10: qty 100

## 2018-11-10 MED ORDER — NALBUPHINE HCL 10 MG/ML IJ SOLN: 5.0000 mg | INTRAMUSCULAR | Status: DC | PRN

## 2018-11-10 MED ORDER — PRENATAL MULTIVITAMIN CH
1.0000 | ORAL_TABLET | Freq: Every day | ORAL | Status: DC
  Administered 2018-11-11: 1 via ORAL
  Filled 2018-11-10: qty 1

## 2018-11-10 MED ORDER — OXYCODONE HCL 5 MG PO TABS
5.0000 mg | ORAL_TABLET | ORAL | Status: DC | PRN
  Administered 2018-11-11: 5 mg via ORAL
  Administered 2018-11-11: 10 mg via ORAL
  Administered 2018-11-12: 5 mg via ORAL
  Administered 2018-11-12: 10 mg via ORAL
  Filled 2018-11-10: qty 2
  Filled 2018-11-10 (×2): qty 1
  Filled 2018-11-10: qty 2

## 2018-11-10 MED ORDER — METOCLOPRAMIDE HCL 5 MG/ML IJ SOLN
INTRAMUSCULAR | Status: DC | PRN
  Administered 2018-11-10: 10 mg via INTRAVENOUS

## 2018-11-10 MED ORDER — PHENYLEPHRINE HCL-NACL 20-0.9 MG/250ML-% IV SOLN
INTRAVENOUS | Status: AC
  Filled 2018-11-10: qty 250

## 2018-11-10 MED ORDER — SODIUM CHLORIDE 0.9 % IV SOLN
INTRAVENOUS | Status: DC | PRN
  Administered 2018-11-10: 14:00:00 via INTRAVENOUS

## 2018-11-10 MED ORDER — FAMOTIDINE IN NACL 20-0.9 MG/50ML-% IV SOLN
20.0000 mg | Freq: Once | INTRAVENOUS | Status: AC
  Administered 2018-11-10: 20 mg via INTRAVENOUS
  Filled 2018-11-10: qty 50

## 2018-11-10 MED ORDER — SENNOSIDES-DOCUSATE SODIUM 8.6-50 MG PO TABS
2.0000 | ORAL_TABLET | ORAL | Status: DC
  Administered 2018-11-10 – 2018-11-11 (×2): 2 via ORAL
  Filled 2018-11-10 (×2): qty 2

## 2018-11-10 MED ORDER — NALBUPHINE HCL 10 MG/ML IJ SOLN
5.0000 mg | Freq: Once | INTRAMUSCULAR | Status: AC
  Administered 2018-11-10: 5 mg via INTRAVENOUS
  Filled 2018-11-10: qty 1

## 2018-11-10 MED ORDER — FENTANYL CITRATE (PF) 100 MCG/2ML IJ SOLN
INTRAMUSCULAR | Status: AC
  Filled 2018-11-10: qty 2

## 2018-11-10 MED ORDER — MENTHOL 3 MG MT LOZG: 1.0000 | LOZENGE | OROMUCOSAL | Status: DC | PRN

## 2018-11-10 MED ORDER — MORPHINE SULFATE (PF) 0.5 MG/ML IJ SOLN
INTRAMUSCULAR | Status: DC | PRN
  Administered 2018-11-10: 15 mg via INTRATHECAL

## 2018-11-10 MED ORDER — ONDANSETRON HCL 4 MG/2ML IJ SOLN
INTRAMUSCULAR | Status: AC
  Filled 2018-11-10: qty 2

## 2018-11-10 MED ORDER — DIPHENHYDRAMINE HCL 25 MG PO CAPS: 25.0000 mg | ORAL_CAPSULE | Freq: Four times a day (QID) | ORAL | Status: DC | PRN

## 2018-11-10 MED ORDER — DIPHENHYDRAMINE HCL 25 MG PO CAPS
25.0000 mg | ORAL_CAPSULE | ORAL | Status: DC | PRN
  Administered 2018-11-10 – 2018-11-11 (×2): 25 mg via ORAL
  Filled 2018-11-10 (×2): qty 1

## 2018-11-10 MED ORDER — PHENYLEPHRINE HCL-NACL 20-0.9 MG/250ML-% IV SOLN
INTRAVENOUS | Status: DC | PRN
  Administered 2018-11-10: 60 ug/min via INTRAVENOUS

## 2018-11-10 MED ORDER — DEXAMETHASONE SODIUM PHOSPHATE 4 MG/ML IJ SOLN
INTRAMUSCULAR | Status: DC | PRN
  Administered 2018-11-10: 4 mg via INTRAVENOUS

## 2018-11-10 MED ORDER — ACETAMINOPHEN 10 MG/ML IV SOLN
INTRAVENOUS | Status: AC
  Filled 2018-11-10: qty 100

## 2018-11-10 MED ORDER — COCONUT OIL OIL: 1.0000 "application " | TOPICAL_OIL | Status: DC | PRN

## 2018-11-10 MED ORDER — KETOROLAC TROMETHAMINE 30 MG/ML IJ SOLN
30.0000 mg | Freq: Four times a day (QID) | INTRAMUSCULAR | Status: AC
  Administered 2018-11-10 – 2018-11-11 (×3): 30 mg via INTRAVENOUS
  Filled 2018-11-10 (×4): qty 1

## 2018-11-10 MED ORDER — BUPIVACAINE IN DEXTROSE 0.75-8.25 % IT SOLN
INTRATHECAL | Status: DC | PRN
  Administered 2018-11-10: 1.8 mL via INTRATHECAL

## 2018-11-10 MED ORDER — OXYTOCIN 40 UNITS IN NORMAL SALINE INFUSION - SIMPLE MED: 2.5000 [IU]/h | INTRAVENOUS | Status: AC

## 2018-11-10 MED ORDER — SCOPOLAMINE 1 MG/3DAYS TD PT72
MEDICATED_PATCH | TRANSDERMAL | Status: AC
  Filled 2018-11-10: qty 1

## 2018-11-10 MED ORDER — PHENYLEPHRINE 40 MCG/ML (10ML) SYRINGE FOR IV PUSH (FOR BLOOD PRESSURE SUPPORT)
PREFILLED_SYRINGE | INTRAVENOUS | Status: AC
  Filled 2018-11-10: qty 10

## 2018-11-10 MED ORDER — WITCH HAZEL-GLYCERIN EX PADS: 1.0000 "application " | MEDICATED_PAD | CUTANEOUS | Status: DC | PRN

## 2018-11-10 MED ORDER — IBUPROFEN 800 MG PO TABS
800.0000 mg | ORAL_TABLET | Freq: Three times a day (TID) | ORAL | Status: DC
  Administered 2018-11-11 – 2018-11-12 (×2): 800 mg via ORAL
  Filled 2018-11-10 (×2): qty 1

## 2018-11-10 MED ORDER — OXYTOCIN 40 UNITS IN NORMAL SALINE INFUSION - SIMPLE MED
INTRAVENOUS | Status: AC
  Filled 2018-11-10: qty 1000

## 2018-11-10 MED ORDER — LACTATED RINGERS IV BOLUS
1000.0000 mL | Freq: Once | INTRAVENOUS | Status: AC
  Administered 2018-11-10: 1000 mL via INTRAVENOUS

## 2018-11-10 MED ORDER — DIPHENHYDRAMINE HCL 50 MG/ML IJ SOLN: 12.5000 mg | INTRAMUSCULAR | Status: DC | PRN

## 2018-11-10 MED ORDER — DEXAMETHASONE SODIUM PHOSPHATE 4 MG/ML IJ SOLN
INTRAMUSCULAR | Status: AC
  Filled 2018-11-10: qty 7

## 2018-11-10 MED ORDER — SOD CITRATE-CITRIC ACID 500-334 MG/5ML PO SOLN: 30.0000 mL | Freq: Once | ORAL | Status: DC

## 2018-11-10 MED ORDER — ENOXAPARIN SODIUM 40 MG/0.4ML ~~LOC~~ SOLN
40.0000 mg | SUBCUTANEOUS | Status: DC
  Administered 2018-11-11 – 2018-11-12 (×2): 40 mg via SUBCUTANEOUS
  Filled 2018-11-10 (×2): qty 0.4

## 2018-11-10 MED ORDER — FENTANYL CITRATE (PF) 100 MCG/2ML IJ SOLN
100.0000 ug | INTRAMUSCULAR | Status: DC | PRN
  Administered 2018-11-10 (×2): 100 ug via INTRAVENOUS
  Filled 2018-11-10 (×2): qty 2

## 2018-11-10 MED ORDER — ACETAMINOPHEN 10 MG/ML IV SOLN
1000.0000 mg | Freq: Once | INTRAVENOUS | Status: DC | PRN
  Administered 2018-11-10: 1000 mg via INTRAVENOUS

## 2018-11-10 MED ORDER — SOD CITRATE-CITRIC ACID 500-334 MG/5ML PO SOLN
30.0000 mL | ORAL | Status: AC
  Administered 2018-11-10: 30 mL via ORAL
  Filled 2018-11-10: qty 30

## 2018-11-10 MED ORDER — LACTATED RINGERS IV SOLN
INTRAVENOUS | Status: DC
  Administered 2018-11-10 – 2018-11-11 (×2): via INTRAVENOUS

## 2018-11-10 MED ORDER — OXYTOCIN 10 UNIT/ML IJ SOLN
INTRAMUSCULAR | Status: DC | PRN
  Administered 2018-11-10: 40 [IU] via INTRAMUSCULAR

## 2018-11-10 SURGICAL SUPPLY — 40 items
APL SKNCLS STERI-STRIP NONHPOA (GAUZE/BANDAGES/DRESSINGS) ×1
BENZOIN TINCTURE PRP APPL 2/3 (GAUZE/BANDAGES/DRESSINGS) ×2 IMPLANT
CHLORAPREP W/TINT 26ML (MISCELLANEOUS) ×2 IMPLANT
CLAMP CORD UMBIL (MISCELLANEOUS) IMPLANT
CLOSURE STERI STRIP 1/2 X4 (GAUZE/BANDAGES/DRESSINGS) ×1 IMPLANT
CLOTH BEACON ORANGE TIMEOUT ST (SAFETY) ×2 IMPLANT
DRAPE C SECTION CLR SCREEN (DRAPES) IMPLANT
DRSG OPSITE POSTOP 4X10 (GAUZE/BANDAGES/DRESSINGS) ×2 IMPLANT
ELECT REM PT RETURN 9FT ADLT (ELECTROSURGICAL) ×2
ELECTRODE REM PT RTRN 9FT ADLT (ELECTROSURGICAL) ×1 IMPLANT
EXTRACTOR VACUUM M CUP 4 TUBE (SUCTIONS) IMPLANT
GLOVE BIO SURGEON STRL SZ7.5 (GLOVE) ×2 IMPLANT
GLOVE BIOGEL PI IND STRL 7.0 (GLOVE) ×1 IMPLANT
GLOVE BIOGEL PI INDICATOR 7.0 (GLOVE) ×1
GOWN STRL REUS W/TWL 2XL LVL3 (GOWN DISPOSABLE) ×2 IMPLANT
GOWN STRL REUS W/TWL LRG LVL3 (GOWN DISPOSABLE) ×4 IMPLANT
KIT ABG SYR 3ML LUER SLIP (SYRINGE) IMPLANT
NDL HYPO 25X5/8 SAFETYGLIDE (NEEDLE) IMPLANT
NEEDLE HYPO 22GX1.5 SAFETY (NEEDLE) ×2 IMPLANT
NEEDLE HYPO 25X5/8 SAFETYGLIDE (NEEDLE) IMPLANT
NS IRRIG 1000ML POUR BTL (IV SOLUTION) ×2 IMPLANT
PACK C SECTION WH (CUSTOM PROCEDURE TRAY) ×2 IMPLANT
PAD OB MATERNITY 4.3X12.25 (PERSONAL CARE ITEMS) ×2 IMPLANT
PENCIL SMOKE EVAC W/HOLSTER (ELECTROSURGICAL) ×2 IMPLANT
RTRCTR C-SECT PINK 25CM LRG (MISCELLANEOUS) ×2 IMPLANT
STRIP CLOSURE SKIN 1/2X4 (GAUZE/BANDAGES/DRESSINGS) ×2 IMPLANT
SUT CHROMIC 1 CTX 36 (SUTURE) ×4 IMPLANT
SUT VIC AB 1 CT1 36 (SUTURE) ×4 IMPLANT
SUT VIC AB 2-0 CT1 (SUTURE) ×2 IMPLANT
SUT VIC AB 2-0 CT1 27 (SUTURE) ×2
SUT VIC AB 2-0 CT1 TAPERPNT 27 (SUTURE) ×1 IMPLANT
SUT VIC AB 3-0 CT1 27 (SUTURE) ×4
SUT VIC AB 3-0 CT1 TAPERPNT 27 (SUTURE) ×2 IMPLANT
SUT VIC AB 3-0 SH 27 (SUTURE)
SUT VIC AB 3-0 SH 27X BRD (SUTURE) IMPLANT
SUT VIC AB 4-0 KS 27 (SUTURE) ×2 IMPLANT
SYR BULB IRRIGATION 50ML (SYRINGE) IMPLANT
TOWEL OR 17X24 6PK STRL BLUE (TOWEL DISPOSABLE) ×2 IMPLANT
TRAY FOLEY W/BAG SLVR 14FR LF (SET/KITS/TRAYS/PACK) ×2 IMPLANT
WATER STERILE IRR 1000ML POUR (IV SOLUTION) ×2 IMPLANT

## 2018-11-10 NOTE — Progress Notes (Signed)
Patient getting more uncomfortable with contractions. Doesn't want RN to recheck her. Sherry Sheppard CNM states will put in pain medication order.

## 2018-11-10 NOTE — Op Note (Signed)
Sherry Sheppard PROCEDURE DATE: 11/10/2018  PREOPERATIVE DIAGNOSES: Intrauterine pregnancy at [redacted]w[redacted]d weeks gestation; history of prior Cesarean delivery x2; non-reactive NST   POSTOPERATIVE DIAGNOSIS: The same  PROCEDURE: Repeat Low Transverse Cesarean Section  SURGEON:  Dr. Nettie Elm    ASSISTANT:  Dr. Rhett Bannister   ANESTHESIOLOGY TEAM: Anesthesiologist: Elmer Picker, MD CRNA: Graciela Husbands, CRNA  INDICATIONS: Sherry Sheppard is a 33 y.o. [phone] at [redacted]w[redacted]d here for cesarean section secondary to the indications listed under preoperative diagnoses; please see preoperative note for further details.  The risks of cesarean section were discussed with the patient including but were not limited to: bleeding which may require transfusion or reoperation; infection which may require antibiotics; injury to bowel, bladder, ureters or other surrounding organs; injury to the fetus; need for additional procedures including hysterectomy in the event of a life-threatening hemorrhage; placental abnormalities wth subsequent pregnancies, incisional problems, thromboembolic phenomenon and other postoperative/anesthesia complications.   The patient concurred with the proposed plan, giving informed written consent for the procedure.    FINDINGS:  Viable female infant in cephalic presentation.  Apgars 9 and 9.  Clear amniotic fluid.  Intact placenta, three vessel cord.  Normal uterus, fallopian tubes and ovaries bilaterally.  ANESTHESIA: Spinal INTRAVENOUS FLUIDS: 2300 ml   ESTIMATED BLOOD LOSS: 151 ml per Triton URINE OUTPUT:  200 ml SPECIMENS: Placenta sent to L&D COMPLICATIONS: None immediate  PROCEDURE IN DETAIL:  The patient preoperatively received intravenous antibiotics and had sequential compression devices applied to her lower extremities.  She was then taken to the operating room where spinal anesthesia was administered and was found to be adequate. She was then placed in a dorsal  supine position with a leftward tilt, and prepped and draped in a sterile manner.  A foley catheter was placed into her bladder and attached to constant gravity.  After an adequate timeout was performed, a Pfannenstiel skin incision was made with scalpel on her preexisting scar and carried through to the underlying layer of fascia. The fascia was incised in the midline, and this incision was extended bilaterally using the Mayo scissors.  Kocher clamps were applied to the superior aspect of the fascial incision and the underlying rectus muscles were dissected off bluntly.  A similar process was carried out on the inferior aspect of the fascial incision. The rectus muscles were separated in the midline and the peritoneum was entered bluntly. The Alexis self-retaining retractor was introduced into the abdominal cavity.  Attention was turned to the lower uterine segment where a low transverse hysterotomy was made with a scalpel and extended bilaterally bluntly.  The infant was successfully delivered, the cord was clamped and cut after one minute, and the infant was handed over to the awaiting neonatology team. Uterine massage was then administered, and the placenta delivered intact with a three-vessel cord. The uterus was then cleared of clots and debris.  The hysterotomy was closed with 0 Chromic in a running locked fashion, and an imbricating layer was also placed with 0 Chromic. The pelvis was cleared of all clot and debris. Hemostasis was confirmed on all surfaces.  The retractor was removed. A utero-abdominal adhesion was noted on the right wall and was taken down and tied. The peritoneum and muscles were closed with a 2-0 Vicryl running stitch. The fascia was then closed using 1-0 Vicryl in a running fashion.  The subcutaneous layer was irrigated, and the skin was closed with a 4-0 Vicryl subcuticular stitch. The patient tolerated the  procedure well. Sponge, instrument and needle counts were correct x 3.  She was  taken to the recovery room in stable condition.   Sherry DeerLaurel S. Earlene PlaterWallace, DO OB/GYN Fellow

## 2018-11-10 NOTE — MAU Provider Note (Addendum)
Chief Complaint:  Contractions   First Provider Initiated Contact with Patient 11/10/18 [phone]      HPI: Sherry Sheppard is a 33 y.o. [id number] at [redacted]w[redacted]d by LMP pt of [facility] with obstetric hx significant for  who presents to maternity admissions reporting painful worsening contractions. She is breathing through contractions in MAU.  Her pain is in her low abdomen, intermittent, and radiates to her low back.  There are no associated symptoms. She has tried changing positions and warm shower but these have not helped.   She reports good fetal movement.  HPI  Past Medical History: Past Medical History:  Diagnosis Date  . Allergic rhinitis   . AVM (arteriovenous malformation) brain   . Migraines   . Seizures (HCC)    last event 08/24/12  . Vaginal Pap smear, abnormal     Past obstetric history: OB History  Gravida Para Term Preterm AB Living  SAB TAB Ectopic Multiple Live Births  1       2    # Outcome Date GA Lbr Len/2nd Weight Sex Delivery Anes PTL Lv  4 Current           3 Term 08/24/12 [redacted]w[redacted]d  3650 g M CS-Vac Spinal  LIV     Birth Comments: had seizure during last CS had an AVM completely resolved as of 6/19 had surgery 12/2012  2 SAB 2013             Birth Comments: System Generated. Please review and update pregnancy details.  1 Term 04/07/08    M CS-LTranv   LIV     Birth Comments: c/S because FTP and fetal distress    Past Surgical History: Past Surgical History:  Procedure Laterality Date  . 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 CESAREAN SECTION  2009  . CESAREAN SECTION N/A 08/24/2012   Procedure: CESAREAN SECTION;  Surgeon: Kathreen Cosier, MD;  Location: [facility] ORS;  Service: Obstetrics;  Laterality: N/A;  . DILATION AND EVACUATION  09/20/2011   Procedure: DILATATION AND EVACUATION;  Surgeon: Brock Bad, MD;  Location: [facility] ORS;  Service: Gynecology;  Laterality: N/A;  . TOOTH EXTRACTION  2012    Family History: Family  History  Problem Relation Age of Onset  . Hyperlipidemia Mother   . Diabetes Mother   . Hypertension Father   . Lung cancer Maternal Grandmother 66  . Hypertension Maternal Aunt   . Diabetes Cousin     Social History: Social History   Tobacco Use  . Smoking status: Never Smoker  . Smokeless tobacco: Never Used  Substance Use Topics  . Alcohol use: No    Alcohol/week: 0.0 standard drinks  . Drug use: No    Allergies:  Allergies  Allergen Reactions  . Banana Shortness Of Breath  . Other Nausea And Vomiting    Contrast dye  . Procardia [Nifedipine] Palpitations    Meds:  Medications Prior to Admission  Medication Sig Dispense Refill Last Dose  . Cholecalciferol (VITAMIN D) 2000 units CAPS Take 1 capsule (2,000 Units total) by mouth daily before breakfast. 30 capsule 11 Past Week at Unknown time  . Docusate Sodium (COLACE PO) Take by mouth 2 (two) times daily.   11/09/2018 at Unknown time  . Ferrous Sulfate (IRON PO) Take by mouth.   11/09/2018 at Unknown time  . Prenatal Vit-Fe Fumarate-FA (PRENATAL VITAMIN PLUS LOW IRON) 27-1  MG TABS TAKE 1 TABLET BY MOUTH DAILY BEFORE BREAKFAST. 30 tablet 11 11/09/2018 at Unknown time  . Vitamin D, Ergocalciferol, (DRISDOL) 50000 units CAPS capsule Take 1 capsule (50,000 Units total) by mouth every 7 (seven) days. 12 capsule 0 11/09/2018 at Unknown time  . Acetaminophen (TYLENOL PO) Take by mouth.   Taking  . cyclobenzaprine (FLEXERIL) 5 MG tablet Take 1 tablet (5 mg total) by mouth 3 (three) times daily as needed for muscle spasms. 30 tablet 2 Taking  . cyproheptadine (PERIACTIN) 4 MG tablet Take 1 tablet by mouth as needed.     . Doxylamine-Pyridoxine (DICLEGIS) 10-10 MG TBEC 1 tab in AM, 1 tab mid afternoon 2 tabs at bedtime. Max dose 4 tabs daily. (Patient not taking: Reported on 11/07/2018) 100 tablet 5 Not Taking  . Elastic Bandages & Supports (COMFORT FIT MATERNITY SUPP SM) MISC Wear as directed. 1 each 0 Taking  . fluconazole (DIFLUCAN) 100  MG tablet Take 1 tablet (100 mg total) by mouth daily. 14 tablet 2   . nystatin (MYCOSTATIN) 100000 UNIT/ML suspension Take 5 mls by mouth and swish and swallow 4 times daily 60 mL 1 Taking    ROS:  Review of Systems  Constitutional: Negative for chills, fatigue and fever.  Eyes: Negative for visual disturbance.  Respiratory: Negative for shortness of breath.   Cardiovascular: Negative for chest pain.  Gastrointestinal: Positive for abdominal pain. Negative for nausea and vomiting.  Genitourinary: Positive for pelvic pain. Negative for difficulty urinating, dysuria, flank pain, vaginal bleeding, vaginal discharge and vaginal pain.  Musculoskeletal: Positive for back pain.  Neurological: Negative for dizziness and headaches.  Psychiatric/Behavioral: Negative.      I have reviewed patient's Past Medical Hx, Surgical Hx, Family Hx, Social Hx, medications and allergies.   Physical Exam   Patient Vitals for the past 24 hrs:  BP Temp Pulse Resp Height Weight  11/10/18 0502 - 98.9 F (37.2 C) - - - -  11/10/18 0500 126/83 - (!) 119 20 - -  11/10/18 0456 - - - - 5\' 5"  (1.651 m) 88.5 kg   Constitutional: Well-developed, well-nourished female in no acute distress.  Cardiovascular: normal rate Respiratory: normal effort GI: Abd soft, non-tender, gravid appropriate for gestational age.  MS: Extremities nontender, no edema, normal ROM Neurologic: Alert and oriented x 4.  GU: Neg CVAT.   Dilation: Fingertip Effacement (%): 30 Cervical Position: Posterior Station: Ballotable Presentation: Undeterminable Exam by:: Quintella BatonJo Barham RNC  FHT:  Baseline 145 , moderate variability, accelerations present, no decelerations, tracing MHR intermittently Contractions: Q 2-4 minutes   Labs: No results found for this or any previous visit (from the past 24 hour(s)). A/Positive/-- (10/23 1630)  Imaging:  No results found.  MAU Course/MDM: Orders Placed This Encounter  Procedures  . US MFM FETAL  BPP WO NON STRESS    No orders of the defined types were placed in this encounter.    NST reviewed and not reactive BPP ordered, done at bedside due to pt discomfort LR 1000 ml, Fentanyl 100 mcg IV push x 1 Report to [place] Fujie Dickison, CNM with results pending   Sharen CounterLisa Leftwich-Kirby Certified Nurse-Midwife 11/10/2018 7:49 AM   Dorathy KinsmanVirginia Lyndall Bellot, CNM assumed care of pt at 0800. Pt getting BPP. BPP 6/8, -2 for no FBM.  Crying w/ UC's. No cervical change per RN exam.  Called Dr. Alysia PennaErvin to discuss contractions, [facility]. Questionable decel vs tracing MHR at 0836. EFM adjusted. Will come assess  pt and determine POC. Pt NPO since 2130.   Dr. Alysia Penna at [facility], reviewing FHR tracing and BPP--> Will proceed with C/S. Assuming care of pt.   Katrinka Blazing, [place], CNM 11/10/2018 9:32 AM

## 2018-11-10 NOTE — Progress Notes (Signed)
Pt up to BR

## 2018-11-10 NOTE — MAU Note (Signed)
Ctxs yesterday that were pretty strong. Able to sleep some but ctxs awoke me at 0200. Denies LOF or bleeding. States was lying down and I turned to my other side and had weird feeling and like "pop" and ctxs started coming more. For repeat c/s 11/17/2018

## 2018-11-10 NOTE — H&P (Signed)
Sherry Sheppard is a 33 y.o. female G3P2002 IUP 38 0/7 weeks presenting with strong regular ut ctx since @ midnight. Irregular ut ctx yesterday that became progressively stronger. Denies VB or LOF. Prenatal care unremarkable except for H/O AVM, s/p Gamma knife repair and H/O c section x 2 . W/U in MAU, non reactive NST and 6/10 BPP ( - 2 for breathing and NST). Pt with not cervical change and no pain relief after pain medication.  Enlight of non reassuring fetal testing and prior c section x 2, will proceed toward delivery via c section.  OB History    Gravida  4   Para  2   Term  2   Preterm      AB  1   Living  2     SAB  1   TAB      Ectopic      Multiple      Live Births  2          Past Medical History:  Diagnosis Date  . Allergic rhinitis   . AVM (arteriovenous malformation) brain   . Migraines   . Seizures (HCC)    last event 08/24/12  . Vaginal Pap smear, abnormal    Past Surgical History:  Procedure Laterality Date  . 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 CESAREAN SECTION  2009  . CESAREAN SECTION N/A 08/24/2012   Procedure: CESAREAN SECTION;  Surgeon: Sherry CosierBernard A Marshall, MD;  Location: [facility];  Service: Obstetrics;  Laterality: N/A;  . DILATION AND EVACUATION  09/20/2011   Procedure: DILATATION AND EVACUATION;  Surgeon: Sherry Badharles A Harper, MD;  Location: [facility];  Service: Gynecology;  Laterality: N/A;  . TOOTH EXTRACTION  2012   Family History: family history includes Diabetes in her cousin and mother; Hyperlipidemia in her mother; Hypertension in her father and maternal aunt; Lung cancer (age of onset: [phone]) in her maternal grandmother. Social History:  reports that she has never smoked. She has never used smokeless tobacco. She reports that she does not drink alcohol or use drugs.     Maternal Diabetes: No Genetic Screening: Normal Maternal Ultrasounds/Referrals: Normal Fetal Ultrasounds or other Referrals:  None Maternal  Substance Abuse:  No Significant Maternal Medications:  None Significant Maternal Lab Results:  None Other Comments:  None  Review of Systems  Constitutional: Negative.   Respiratory: Negative.   Cardiovascular: Negative.   Gastrointestinal: Positive for abdominal pain.  Genitourinary: Negative.    History Dilation: Fingertip Effacement (%): 30 Station: Ballotable Exam by:: Sherry Sheppard RNC Blood pressure 126/83, pulse (!) 119, temperature 98.9 F (37.2 C), resp. rate 20, height 5\' 5"  (1.651 m), weight 88.5 kg, last menstrual period 02/17/2018. Exam Physical Exam  Constitutional: She appears well-developed and well-nourished.  Uncomfortable with ut ctx  Cardiovascular: Normal rate and regular rhythm.  Respiratory: Effort normal and breath sounds normal.  GI: Soft. Bowel sounds are normal.  Gravid FH = dates  Genitourinary:    Genitourinary Comments: See CMW's exam   Musculoskeletal:     Comments: Trace edema    Prenatal labs: ABO, Rh: A/Positive/-- (10/23 1630) Antibody: Positive, See Final Results (10/23 1630) Rubella: 5.82 (10/23 1630) RPR: Non Reactive (02/28 0846)  HBsAg: Negative (10/23 1630)  HIV: Non Reactive (02/28 0846)  GBS:     Assessment/Plan: IUP 38 0/7 weeks Non reassuring fetal testing BPP 6/10 Prior c section x 2  Will proceed to repeat c  section. R/B/Post op care reviewed Nursing and anesthesia aware. Will proceed to OR when available    Sherry Sheppard 11/10/2018, 9:15 AM

## 2018-11-10 NOTE — Discharge Summary (Signed)
Obstetrics Discharge Summary [facility]   Patient Name: Sherry Sheppard DOB: 01/04/1986 MRN: [id number]  Date of admission: 11/10/2018 Delivering MD: Hermina StaggersERVIN, MICHAEL L   Date of discharge: 11/12/2018  Admitting diagnosis: 38wks, contractions Intrauterine pregnancy: 8559w0d     Secondary diagnosis:   Principal Problem:   Non-reactive NST (non-stress test) Active Problems:   Seizures (HCC)   AVM (arteriovenous malformation) brain   History of cesarean delivery   Cesarean delivery delivered    Discharge diagnosis: Term Pregnancy Delivered by Cesarean section      Postpartum procedures: None  Complications: none  Outpatient Follow-Up: [ ]  desires IUD at postpartum follow-up   Hospital course: Sherry Sheppard is a 33 y.o. 5259w0d who was admitted for repeat LTCS given history of prior Cesarean delivery x2 and BPP 6/10 in setting of early labor. Her pregnancy was complicated by above noted. Delivery was uncomplicated. Please see delivery/op note for additional details. Her HgB trended from 11.4 to 9.2 on POD#1. Her postpartum course was uncomplicated. She was breastfeeding without difficulty. By day of discharge, she was passing flatus, urinating, eating and drinking without difficulty. She had several BMs yesterday, having some gas pains today. Her pain was well-controlled, and she was discharged home with ibuprofen and oxycodone and recommended taking some gas pills. She will follow-up in clinic in 2 weeks.   Physical exam  Vitals:   11/11/18 1230 11/11/18 1449 11/11/18 2137 11/12/18 0427  BP: (!) 93/53 97/64 108/75 94/68  Pulse: 99 86 92 86  Resp: 18 18 16 18   Temp: 98 F (36.7 C) 98.4 F (36.9 C) 98 F (36.7 C) 98.8 F (37.1 C)  TempSrc: Oral Oral  Oral  SpO2: 100% 98% 100% 100%  Weight:      Height:       General: WDWN Lochia: appropriate Uterine Fundus: firm Incision: Healing well with no significant drainage, No significant erythema, Dressing is clean,  dry, and intact DVT Evaluation: No evidence of DVT seen on physical exam. Negative Homan's sign. No cords or calf tenderness. Labs: Lab Results  Component Value Date   WBC 9.2 11/11/2018   HGB 9.2 (L) 11/11/2018   HCT 29.5 (L) 11/11/2018   MCV 82.4 11/11/2018   PLT 231 11/11/2018   CMP Latest Ref Rng & Units 11/11/2018  Glucose 65 - 99 mg/dL -  BUN 6 - 20 mg/dL -  Creatinine 4.090.44 - 8.111.00 mg/dL 9.140.60  Sodium 782134 - 956144 mmol/L -  Potassium 3.5 - 5.2 mmol/L -  Chloride 96 - 106 mmol/L -  CO2 20 - 29 mmol/L -  Calcium 8.7 - 10.2 mg/dL -  Total Protein 6.0 - 8.5 g/dL -  Total Bilirubin 0.0 - 1.2 mg/dL -  Alkaline Phos 39 - 213117 IU/L -  AST 0 - 40 IU/L -  ALT 0 - 32 IU/L -    Discharge instructions: Per After Visit Summary and "Baby and Me Booklet"  After visit meds:  Allergies as of 11/12/2018      Reactions   Banana Shortness Of Breath   Other Nausea And Vomiting   Contrast dye   Procardia [nifedipine] Palpitations      Medication List    STOP taking these medications   cyclobenzaprine 5 MG tablet Commonly known as:  FLEXERIL   TYLENOL PO   Vitamin D 50 MCG (2000 UT) Caps     TAKE these medications   COLACE PO Take by mouth 2 (two) times daily.   cyproheptadine 4  MG tablet Commonly known as:  PERIACTIN Take 1 tablet by mouth as needed.   ibuprofen 800 MG tablet Commonly known as:  ADVIL Take 1 tablet (800 mg total) by mouth 3 (three) times daily.   IRON PO Take by mouth.   oxyCODONE 5 MG immediate release tablet Commonly known as:  Oxy IR/ROXICODONE Take 1 tablet q4 hour prn pain   Prenatal Vitamin Plus Low Iron 27-1 MG Tabs TAKE 1 TABLET BY MOUTH DAILY BEFORE BREAKFAST.       Postpartum contraception:  Diet: Routine Diet Activity: Advance as tolerated. Pelvic rest for 6 weeks.   Follow-up Appt: Future Appointments  Date Time Provider Department Center  11/15/2018  9:15 AM [facility] 1 [facility] None  11/26/2018 11:15 AM Hermina Staggers, MD [facility]  None  12/09/2018 10:00 AM Brock Bad, MD [facility] None   Please schedule this patient for Postpartum visit in: 4-6 weeks with the following provider: Any provider For C/S patients schedule nurse incision check in weeks 2 weeks: yes Low risk pregnancy complicated by: history of prior C/S Delivery mode:  CS Anticipated Birth Control:  IUD PP Procedures needed: Incision check  Schedule Integrated BH visit: no Newborn Data: Live born female    Newborn Delivery   Birth date/time:  11/10/2018 13:54:00 Delivery type:  C-Section, Low Transverse Trial of labor:  No C-section categorization:  Repeat    Baby Feeding: Breast Disposition:home with mother

## 2018-11-10 NOTE — Anesthesia Postprocedure Evaluation (Signed)
Anesthesia Post Note  Patient: AMUNET ARIAS  Procedure(s) Performed: CESAREAN SECTION (N/A )     Patient location during evaluation: PACU Anesthesia Type: Spinal Level of consciousness: oriented and awake and alert Pain management: pain level controlled Vital Signs Assessment: post-procedure vital signs reviewed and stable Respiratory status: spontaneous breathing, respiratory function stable and patient connected to nasal cannula oxygen Cardiovascular status: blood pressure returned to baseline and stable Postop Assessment: no headache, no backache and no apparent nausea or vomiting Anesthetic complications: no    Last Vitals:  Vitals:   11/10/18 1500 11/10/18 1515  BP: 120/69 111/73  Pulse: 100 87  Resp: 20 15  Temp:    SpO2: 97% 99%    Last Pain:  Vitals:   11/10/18 1515  TempSrc:   PainSc: 0-No pain   Pain Goal: Patients Stated Pain Goal: 0 (11/10/18 1218)  LLE Motor Response: No movement due to regional block (11/10/18 1515)   RLE Motor Response: No movement due to regional block (11/10/18 1515)       Epidural/Spinal Function Cutaneous sensation: Tingles (11/10/18 1515), Patient able to flex knees: No (11/10/18 1515), Patient able to lift hips off bed: No (11/10/18 1515), Back pain beyond tenderness at insertion site: No (11/10/18 1515), Progressively worsening motor and/or sensory loss: No (11/10/18 1515), Bowel and/or bladder incontinence post epidural: No (11/10/18 1515)  Marc Leichter L Lahna Nath

## 2018-11-10 NOTE — Anesthesia Procedure Notes (Signed)
Spinal  Patient location during procedure: OR Start time: 11/10/2018 1:16 PM End time: 11/10/2018 1:26 PM Staffing Anesthesiologist: Elmer Picker, MD Performed: anesthesiologist  Preanesthetic Checklist Completed: patient identified, surgical consent, pre-op evaluation, timeout performed, IV checked, risks and benefits discussed and monitors and equipment checked Spinal Block Patient position: sitting Prep: site prepped and draped and DuraPrep Patient monitoring: cardiac monitor, continuous pulse ox and blood pressure Approach: midline Location: L3-4 Injection technique: single-shot Needle Needle type: Pencan  Needle gauge: 24 G Needle length: 9 cm Assessment Sensory level: T6 Additional Notes Functioning IV was confirmed and monitors were applied. Sterile prep and drape, including hand hygiene and sterile gloves were used. The patient was positioned and the spine was prepped. The skin was anesthetized with lidocaine.  Free flow of clear CSF was obtained prior to injecting local anesthetic into the CSF.  The spinal needle aspirated freely following injection.  The needle was carefully withdrawn.  The patient tolerated the procedure well.

## 2018-11-10 NOTE — Anesthesia Preprocedure Evaluation (Signed)
Anesthesia Evaluation  Patient identified by MRN, date of birth, ID band Patient awake    Reviewed: Allergy & Precautions, NPO status , Patient's Chart, lab work & pertinent test results  Airway Mallampati: II  TM Distance: >3 FB Neck ROM: Full    Dental no notable dental hx.    Pulmonary neg pulmonary ROS,    Pulmonary exam normal breath sounds clear to auscultation       Cardiovascular negative cardio ROS Normal cardiovascular exam Rhythm:Regular Rate:Normal     Neuro/Psych  Headaches, Seizures -, Well Controlled,  Seizure during C/S in 2014. 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 No further seizures.  negative psych ROS   GI/Hepatic negative GI ROS, Neg liver ROS,   Endo/Other  negative endocrine ROS  Renal/GU negative Renal ROS  negative genitourinary   Musculoskeletal negative musculoskeletal ROS (+)   Abdominal   Peds negative pediatric ROS (+)  Hematology negative hematology ROS (+)   Anesthesia Other Findings H/o C/Sx2  Reproductive/Obstetrics (+) Pregnancy                            Anesthesia Physical Anesthesia Plan  ASA: III  Anesthesia Plan: Spinal   Post-op Pain Management:    Induction:   PONV Risk Score and Plan: Treatment may vary due to age or medical condition  Airway Management Planned: Natural Airway  Additional Equipment:   Intra-op Plan:   Post-operative Plan:   Informed Consent: I have reviewed the patients History and Physical, chart, labs and discussed the procedure including the risks, benefits and alternatives for the proposed anesthesia with the patient or authorized representative who has indicated his/her understanding and acceptance.     Dental advisory given  Plan Discussed with: CRNA  Anesthesia Plan Comments:         Anesthesia Quick Evaluation

## 2018-11-10 NOTE — MAU Note (Signed)
0915: consent signed by Dr. Alysia Penna. Plan of care discussed with patient. Dr. Alysia Penna states he will call the OR.  0920: RN prepping to done patient for OR.  0925: Sharee Pimple CRNA called to tell RN that she needs to call Anesthesia MD.  [phone]: RN called Dr. Armond Hang. Reviewed NPO status (2130 last night for both solids and liquids); this a repeat c-section for her. Type and Screen along with other labs drawn and sent by lab.  [phone]: Standing OR anesthesia orders placed for Oracit and Pepcid. Message sent to pharmacy for Pepcid to arrive Stat for pending OR case.  0935: OB OR Warehouse manager called to inform of add on c-section. Informed that Dr. Alysia Penna would be calling. States she will talk with him.  [phone]: OR prep completed.  1100: Blood Bank called and informed RN about delayed Type and Screen. Dr. Alysia Penna and Ivonne Andrew CNM in department and made aware. Verbalized understanding. Dr. Alysia Penna informed RN and patient that the case has been moved to a later time.  1244: Dr. Armond Hang called to inform RN that patient could be transported to the Pre-op area. Patient taken to PACU area on stretcher. Handoff given to [employer] which consisted of care received in MAU. Dr. Armond Hang also notified of MAU care.

## 2018-11-10 NOTE — Transfer of Care (Signed)
Immediate Anesthesia Transfer of Care Note  Patient: Sherry Sheppard  Procedure(s) Performed: CESAREAN SECTION (N/A )  Patient Location: PACU  Anesthesia Type:Spinal  Level of Consciousness: awake, alert  and oriented  Airway & Oxygen Therapy: Patient Spontanous Breathing  Post-op Assessment: Report given to RN and Post -op Vital signs reviewed and stable  Post vital signs: Reviewed and stable  Last Vitals:  Vitals Value Taken Time  BP 116/68 11/10/2018  2:53 PM  Temp    Pulse 109 11/10/2018  2:55 PM  Resp 26 11/10/2018  2:55 PM  SpO2 98 % 11/10/2018  2:55 PM  Vitals shown include unvalidated device data.  Last Pain:  Vitals:   11/10/18 1218  TempSrc:   PainSc: 7       Patients Stated Pain Goal: 0 (11/10/18 1218)  Complications: No apparent anesthesia complications

## 2018-11-10 NOTE — Progress Notes (Signed)
Patient reporting painful contractions. Informed of no change in FHR tracing after position change and readjustment. CNM reviewed tracing and ordered BPP.

## 2018-11-10 NOTE — Progress Notes (Signed)
Pt sitting up and FM picking up maternal HR

## 2018-11-11 LAB — CBC
HCT: 29.5 % — ABNORMAL LOW (ref 36.0–46.0)
Hemoglobin: 9.2 g/dL — ABNORMAL LOW (ref 12.0–15.0)
MCH: 25.7 pg — ABNORMAL LOW (ref 26.0–34.0)
MCHC: 31.2 g/dL (ref 30.0–36.0)
MCV: 82.4 fL (ref 80.0–100.0)
Platelets: 231 10*3/uL (ref 150–400)
RBC: 3.58 MIL/uL — ABNORMAL LOW (ref 3.87–5.11)
RDW: 14.4 % (ref 11.5–15.5)
WBC: 9.2 10*3/uL (ref 4.0–10.5)
nRBC: 0 % (ref 0.0–0.2)

## 2018-11-11 LAB — CREATININE, SERUM
Creatinine, Ser: 0.6 mg/dL (ref 0.44–1.00)
GFR calc Af Amer: >60 mL/min (ref >60)
GFR calc non Af Amer: >60 mL/min (ref >60)

## 2018-11-11 LAB — RPR: RPR Ser Ql: NONREACTIVE

## 2018-11-11 MED ORDER — OXYCODONE HCL 5 MG PO TABS: ORAL_TABLET | ORAL | 0 refills | Status: DC

## 2018-11-11 MED ORDER — IBUPROFEN 800 MG PO TABS: 800.0000 mg | ORAL_TABLET | Freq: Three times a day (TID) | ORAL | 0 refills | Status: DC

## 2018-11-11 MED FILL — oxyCODONE HCL 5 MG TABS: 5 | 5 days supply | Qty: 30 | Fill #0

## 2018-11-11 MED FILL — IBUPROFEN 800 MG TAB: 800 | 10 days supply | Qty: 30 | Fill #0

## 2018-11-11 NOTE — Discharge Instructions (Signed)

## 2018-11-11 NOTE — Lactation Note (Signed)
This note was copied from a baby's chart. Lactation Consultation Note Baby 12 hrs old. Mom's 3rd child. Mom has 58 and 32 yr old that she BF for 6 months each. Mom hopes to BF longer with this baby. Mom has everted nipples.  Hand expression w/a bare glistening to tender breast. Mom denies painful latches.  Newborn behavior, STS, I&O, positioning, support, cluster feeding, breast massage, supply and demand discussed. Mom feels BF going well. Encouraged to call for assistance or questions. Lactation brochure at bed side.  Patient Name: Sherry Sheppard [id number]'V Date: 11/11/2018 Reason for consult: Initial assessment;Early term 37-38.6wks   Maternal Data Has patient been taught Hand Expression?: Yes Does the patient have breastfeeding experience prior to this delivery?: Yes  Feeding Feeding Type: Breast Fed  LATCH Score Latch: Grasps breast easily, tongue down, lips flanged, rhythmical sucking.  Audible Swallowing: Spontaneous and intermittent  Type of Nipple: Everted at rest and after stimulation  Comfort (Breast/Nipple): Soft / non-tender  Hold (Positioning): No assistance needed to correctly position infant at breast.  LATCH Score: 10  Interventions Interventions: Breast feeding basics reviewed;Breast compression;Skin to skin;Support pillows;Breast massage;Hand express  Lactation Tools Discussed/Used WIC Program: No   Consult Status Consult Status: Follow-up Date: 11/12/18 Follow-up type: In-patient    Annibelle Brazie, Diamond Nickel 11/11/2018, 2:50 AM

## 2018-11-11 NOTE — Progress Notes (Signed)
Subjective: Postpartum Day 1: Repeat Cesarean Delivery Patient reports vomiting, incisional pain, tolerating PO and + flatus.    Objective: Vital signs in last 24 hours: Temp:  [97.6 F (36.4 C)-99.1 F (37.3 C)] 98.2 F (36.8 C) (05/04 0430) Pulse Rate:  [58-114] 62 (05/04 0430) Resp:  [10-22] 16 (05/04 0430) BP: (98-120)/(58-77) 98/67 (05/04 0430) SpO2:  [97 %-100 %] 99 % (05/03 2100)  Physical Exam:  General: alert Lochia: appropriate Uterine Fundus: firm and appropriately tender at U Honeycomb dressing: clean, dry, intact DVT Evaluation: No evidence of DVT seen on physical exam.  Recent Labs    11/10/18 0924 11/11/18 0451  HGB 11.4* 9.2*  HCT 36.2 29.5*    Assessment/Plan: Status post Cesarean section. Doing well postoperatively.  Continue current care Probable discharge home tomorrow Percocet and IBU prescribed  Allie Bossier 11/11/2018, 8:44 AM

## 2018-11-12 ENCOUNTER — Encounter (HOSPITAL_COMMUNITY): Payer: Self-pay | Admitting: *Deleted

## 2018-11-12 NOTE — Lactation Note (Signed)
This note was copied from a baby's chart. Lactation Consultation Note  Patient Name: Sherry Sheppard EGBTD'V Date: 11/12/2018 Reason for consult: Follow-up assessment;Infant weight loss;Early term 37-38.6wks   P3, 40 hour female infant, ETI , 7.8% weight loss. Infant had 6 voids and 3 stools since birth. Per mom, infant cluster feeding last night. She gave infant 5 ml of formula at  2 am  due to being tired and feeling she did not have enough colostrum for infant, mom stated the infant's  other feedings were at the breast.  LC discussed with mom infant's small tummy size and that the more she breastfeeds infant suckling at breast with good milk transfer the more milk she will produce for baby  "more milk out -more milk in" LC suggested mom breastfeed  according hunger cues and she can hand express afterwards and give infant extra volume of express breast milk in foley cup. LC did not observe latch at this time because mom had  breastfeed infant for 20 minutes 1 hour prior to [facility] entering the room.  LC ask mom to  hand express and mom hand expressed 6 ml of colostrum which was given to infant by foley cup. Dad helped with hand expression as well and stated he will help mom with future feedings, mom plans to breastfeed first, then hand express that is her plan to give back extra volume and do as much STS as possible. Mom knows to call Nurse or LC if she has any questions, concerns or need assistance with latching infant to breast.     Maternal Data    Feeding Feeding Type: Breast Fed  LATCH Score                   Interventions    Lactation Tools Discussed/Used     Consult Status Consult Status: Follow-up Date: 11/12/18 Follow-up type: In-patient    Danelle Earthly 11/12/2018, 6:08 AM

## 2018-11-12 NOTE — Plan of Care (Signed)
Patient appropriate for discharge.

## 2018-11-13 LAB — BPAM RBC
Blood Product Expiration Date: 202005222359
Blood Product Expiration Date: 202005222359
Unit Type and Rh: 6200
Unit Type and Rh: 6200

## 2018-11-13 LAB — TYPE AND SCREEN
ABO/RH(D): A POS
Antibody Screen: POSITIVE
PT AG Type: NEGATIVE
Unit division: 0
Unit division: 0

## 2018-11-14 ENCOUNTER — Encounter: Payer: 59 | Admitting: Obstetrics and Gynecology

## 2018-11-14 ENCOUNTER — Telehealth: Payer: Self-pay

## 2018-11-14 NOTE — Telephone Encounter (Signed)
S/w pt to confirm out of work dates for paper work.

## 2018-11-15 ENCOUNTER — Encounter (HOSPITAL_COMMUNITY)
Admission: RE | Admit: 2018-11-15 | Discharge: 2018-11-15 | Disposition: A | Discharge status: Home / Self Care | Payer: 59 | Source: Ambulatory Visit | Attending: Internal Medicine | Admitting: Internal Medicine

## 2018-11-15 HISTORY — DX: Arteriovenous malformation of cerebral vessels: Q28.2

## 2018-11-17 ENCOUNTER — Inpatient Hospital Stay (HOSPITAL_COMMUNITY): Admit: 2018-11-17 | Payer: 59 | Admitting: Obstetrics and Gynecology

## 2018-11-19 ENCOUNTER — Ambulatory Visit (INDEPENDENT_AMBULATORY_CARE_PROVIDER_SITE_OTHER): Payer: 59 | Admitting: Obstetrics & Gynecology

## 2018-11-19 ENCOUNTER — Encounter: Payer: Self-pay | Admitting: Obstetrics & Gynecology

## 2018-11-19 ENCOUNTER — Other Ambulatory Visit: Payer: Self-pay

## 2018-11-19 VITALS — BP 127/75 | HR 69 | Ht 65.0 in | Wt 177.8 lb

## 2018-11-19 DIAGNOSIS — Z9889 Other specified postprocedural states: Secondary | ICD-10-CM

## 2018-11-19 NOTE — Progress Notes (Signed)
Presented for Incision Check.  C/o odor.  Denies leaking, pain, fever or tenderness.

## 2018-11-20 NOTE — Progress Notes (Signed)
Patient ID: Sherry Sheppard, female   DOB: [birth year], 33 y.o.   MRN: [id number] Subjective:     Sherry Sheppard is a 33 y.o. female who presents to the clinic 1 weeks status post cesarean section for elective repeat. Eating a regular diet without difficulty. Bowel movements are normal. Pain is controlled with current analgesics. Medications being used: ibuprofen (OTC).  The following portions of the patient's history were reviewed and updated as appropriate: allergies, current medications, past family history, past medical history, past social history, past surgical history and problem list.  Review of Systems Pertinent items are noted in HPI.    Objective:    BP 127/75   Pulse 69   Ht 5\' 5"  (1.651 m)   Wt 177 lb 12.8 oz (80.6 kg)   Breastfeeding Yes   BMI 29.59 kg/m  General:  alert, cooperative and no distress  Abdomen: soft, non-tender  Incision:   healing well, no drainage, no erythema, no hernia, no seroma, no swelling, no dehiscence, incision well approximated     Assessment:    Doing well postoperatively. Operative findings again reviewed. Pathology report discussed.    Plan:    1. Continue any current medications. 2. Wound care discussed. 3. Activity restrictions: no lifting more than 15 pounds 4. Anticipated return to work: 5-6 weeks. 5. Follow up: 4 weeks   Adam Phenix, MD 11/20/2018

## 2018-11-20 NOTE — Patient Instructions (Signed)

## 2018-11-26 ENCOUNTER — Ambulatory Visit: Payer: 59 | Admitting: Obstetrics and Gynecology

## 2018-11-30 MED FILL — FLUCONAZOLE 100 MG TAB: 100 | 14 days supply | Qty: 14 | Fill #1

## 2018-12-09 ENCOUNTER — Ambulatory Visit: Payer: 59 | Admitting: Obstetrics

## 2018-12-18 ENCOUNTER — Encounter: Payer: Self-pay | Admitting: Obstetrics and Gynecology

## 2018-12-18 ENCOUNTER — Ambulatory Visit (INDEPENDENT_AMBULATORY_CARE_PROVIDER_SITE_OTHER): Payer: 59 | Admitting: Obstetrics and Gynecology

## 2018-12-18 ENCOUNTER — Other Ambulatory Visit: Payer: Self-pay

## 2018-12-18 DIAGNOSIS — Z1389 Encounter for screening for other disorder: Secondary | ICD-10-CM

## 2018-12-18 DIAGNOSIS — Z3202 Encounter for pregnancy test, result negative: Secondary | ICD-10-CM | POA: Diagnosis not present

## 2018-12-18 DIAGNOSIS — Z30430 Encounter for insertion of intrauterine contraceptive device: Secondary | ICD-10-CM

## 2018-12-18 DIAGNOSIS — Z975 Presence of (intrauterine) contraceptive device: Secondary | ICD-10-CM

## 2018-12-18 LAB — POCT URINE PREGNANCY: Preg Test, Ur: NEGATIVE

## 2018-12-18 MED ORDER — LEVONORGESTREL 20 MCG/24HR IU IUD
INTRAUTERINE_SYSTEM | Freq: Once | INTRAUTERINE | Status: AC
  Administered 2018-12-18: 09:00:00 via INTRAUTERINE

## 2018-12-18 NOTE — Progress Notes (Signed)
Post Partum Exam  Sherry Sheppard is a 33 y.o. [phone] female who presents for a postpartum visit. She is 5 weeks postpartum following a low cervical transverse Cesarean section. I have fully reviewed the prenatal and intrapartum course. The delivery was at 32 gestational weeks.  Anesthesia: spinal. Postpartum course has been doing well. Baby's course has been doing well . Baby is feeding by breast. Bleeding no bleeding. Bowel function is normal. Bladder function is normal. Patient is sexually active. Contraception method is condoms.  Postpartum depression screening:neg   Last pap smear done 02/2018 and was Normal  Review of Systems Pertinent items are noted in HPI.    Objective:  currently breastfeeding.  General:  alert   Breasts:  not examined  Lungs: clear to auscultation bilaterally  Heart:  regular rate and rhythm, S1, S2 normal, no murmur, click, rub or gallop  Abdomen: soft, non-tender; bowel sounds normal; no masses,  no organomegaly, incision well healed   Vulva:  normal  Vagina: normal vagina  Cervix:  no lesions  Corpus: normal  Adnexa:  normal adnexa  Rectal Exam: Not performed.        GYNECOLOGY CLINIC PROCEDURE NOTE  Sherry Sheppard is a 33 y.o. [phone] here for Mirena IUD insertion. No GYN concerns.  Last pap smear was on 01/2017 and was normal.  IUD Removal and Reinsertion  Patient identified, informed consent performed, consent signed.   Discussed risks of irregular bleeding, cramping, infection, malpositioning or misplacement of the IUD outside the uterus which may require further procedures. Also advised to use backup contraception for one week as the risk of pregnancy is higher during the transition period of removing an IUD and replacing it with another one. Time out was performed. Speculum placed in the vagina.  The cervix was cleaned with Betadine x 2 and grasped anteriorly with a single tooth tenaculum.  The new Mirena IUD insertion apparatus was used to  sound the uterus to 8 cm;  the IUD was then placed per manufacturer's recommendations. Strings trimmed to 3 cm. Tenaculum was removed, good hemostasis noted. Patient tolerated procedure well.   Patient was given post-procedure instructions.  She was reminded to have backup contraception for one week during this transition period between IUDs.  Patient was also asked to check IUD strings periodically and follow up in 4 weeks for IUD check.     Assessment:    NL postpartum exam. Pap smear not done at today's visit.   IUD insertion Plan:   1. Contraception: IUD, inserted today 2. Return to nl ADL's 3. Follow up in: 4 weeks for IUID check, then in 1 yr  or as needed.

## 2018-12-18 NOTE — Patient Instructions (Signed)
IUD PLACEMENT POST-PROCEDURE INSTRUCTIONS  1. You may take Ibuprofen, Aleve or Tylenol for pain if needed.  Cramping should resolve within in 24 hours.  2. You may have a small amount of spotting.  You should wear a mini pad for the next few days.  3. You may have intercourse after 24 hours.  If you using this for birth control, it is effective immediately.  4. You need to call if you have any pelvic pain, fever, heavy bleeding or foul smelling vaginal discharge.  Irregular bleeding is common the first several months after having an IUD placed. You do not need to call for this reason unless you are concerned.  5. Shower or bathe as normal  You should have a follow-up appointment in 4-8 weeks for a re-check to make sure you are not having any problems.Health Maintenance, Female Adopting a healthy lifestyle and getting preventive care can go a long way to promote health and wellness. Talk with your health care provider about what schedule of regular examinations is right for you. This is a good chance for you to check in with your provider about disease prevention and staying healthy. In between checkups, there are plenty of things you can do on your own. Experts have done a lot of research about which lifestyle changes and preventive measures are most likely to keep you healthy. Ask your health care provider for more information. Weight and diet Eat a healthy diet  Be sure to include plenty of vegetables, fruits, low-fat dairy products, and lean protein.  Do not eat a lot of foods high in solid fats, added sugars, or salt.  Get regular exercise. This is one of the most important things you can do for your health. ? Most adults should exercise for at least 150 minutes each week. The exercise should increase your heart rate and make you sweat (moderate-intensity exercise). ? Most adults should also do strengthening exercises at least twice a week. This is in addition to the moderate-intensity  exercise. Maintain a healthy weight  Body mass index (BMI) is a measurement that can be used to identify possible weight problems. It estimates body fat based on height and weight. Your health care provider can help determine your BMI and help you achieve or maintain a healthy weight.  For females 53 years of age and older: ? A BMI below 18.5 is considered underweight. ? A BMI of 18.5 to 24.9 is normal. ? A BMI of 25 to 29.9 is considered overweight. ? A BMI of 30 and above is considered obese. Watch levels of cholesterol and blood lipids  You should start having your blood tested for lipids and cholesterol at 32 years of age, then have this test every 5 years.  You may need to have your cholesterol levels checked more often if: ? Your lipid or cholesterol levels are high. ? You are older than 32 years of age. ? You are at high risk for heart disease. Cancer screening Lung Cancer  Lung cancer screening is recommended for adults 36-35 years old who are at high risk for lung cancer because of a history of smoking.  A yearly low-dose CT scan of the lungs is recommended for people who: ? Currently smoke. ? Have quit within the past 15 years. ? Have at least a 30-pack-year history of smoking. A pack year is smoking an average of one pack of cigarettes a day for 1 year.  Yearly screening should continue until it has been 15 years  since you quit.  Yearly screening should stop if you develop a health problem that would prevent you from having lung cancer treatment. Breast Cancer  Practice breast self-awareness. This means understanding how your breasts normally appear and feel.  It also means doing regular breast self-exams. Let your health care provider know about any changes, no matter how small.  If you are in your 20s or 30s, you should have a clinical breast exam (CBE) by a health care provider every 1-3 years as part of a regular health exam.  If you are 81 or older, have a CBE  every year. Also consider having a breast X-ray (mammogram) every year.  If you have a family history of breast cancer, talk to your health care provider about genetic screening.  If you are at high risk for breast cancer, talk to your health care provider about having an MRI and a mammogram every year.  Breast cancer gene (BRCA) assessment is recommended for women who have family members with BRCA-related cancers. BRCA-related cancers include: ? Breast. ? Ovarian. ? Tubal. ? Peritoneal cancers.  Results of the assessment will determine the need for genetic counseling and BRCA1 and BRCA2 testing. Cervical Cancer Your health care provider may recommend that you be screened regularly for cancer of the pelvic organs (ovaries, uterus, and vagina). This screening involves a pelvic examination, including checking for microscopic changes to the surface of your cervix (Pap test). You may be encouraged to have this screening done every 3 years, beginning at age 46.  For women ages 28-65, health care providers may recommend pelvic exams and Pap testing every 3 years, or they may recommend the Pap and pelvic exam, combined with testing for human papilloma virus (HPV), every 5 years. Some types of HPV increase your risk of cervical cancer. Testing for HPV may also be done on women of any age with unclear Pap test results.  Other health care providers may not recommend any screening for nonpregnant women who are considered low risk for pelvic cancer and who do not have symptoms. Ask your health care provider if a screening pelvic exam is right for you.  If you have had past treatment for cervical cancer or a condition that could lead to cancer, you need Pap tests and screening for cancer for at least 20 years after your treatment. If Pap tests have been discontinued, your risk factors (such as having a new sexual partner) need to be reassessed to determine if screening should resume. Some women have medical  problems that increase the chance of getting cervical cancer. In these cases, your health care provider may recommend more frequent screening and Pap tests. Colorectal Cancer  This type of cancer can be detected and often prevented.  Routine colorectal cancer screening usually begins at 32 years of age and continues through 32 years of age.  Your health care provider may recommend screening at an earlier age if you have risk factors for colon cancer.  Your health care provider may also recommend using home test kits to check for hidden blood in the stool.  A small camera at the end of a tube can be used to examine your colon directly (sigmoidoscopy or colonoscopy). This is done to check for the earliest forms of colorectal cancer.  Routine screening usually begins at age 49.  Direct examination of the colon should be repeated every 5-10 years through 32 years of age. However, you may need to be screened more often if early forms of  precancerous polyps or small growths are found. Skin Cancer  Check your skin from head to toe regularly.  Tell your health care provider about any new moles or changes in moles, especially if there is a change in a mole's shape or color.  Also tell your health care provider if you have a mole that is larger than the size of a pencil eraser.  Always use sunscreen. Apply sunscreen liberally and repeatedly throughout the day.  Protect yourself by wearing long sleeves, pants, a wide-brimmed hat, and sunglasses whenever you are outside. Heart disease, diabetes, and high blood pressure  High blood pressure causes heart disease and increases the risk of stroke. High blood pressure is more likely to develop in: ? People who have blood pressure in the high end of the normal range (130-139/85-89 mm Hg). ? People who are overweight or obese. ? People who are African American.  If you are 3-43 years of age, have your blood pressure checked every 3-5 years. If you  are 41 years of age or older, have your blood pressure checked every year. You should have your blood pressure measured twice-once when you are at a hospital or clinic, and once when you are not at a hospital or clinic. Record the average of the two measurements. To check your blood pressure when you are not at a hospital or clinic, you can use: ? An automated blood pressure machine at a pharmacy. ? A home blood pressure monitor.  If you are between 96 years and 76 years old, ask your health care provider if you should take aspirin to prevent strokes.  Have regular diabetes screenings. This involves taking a blood sample to check your fasting blood sugar level. ? If you are at a normal weight and have a low risk for diabetes, have this test once every three years after 32 years of age. ? If you are overweight and have a high risk for diabetes, consider being tested at a younger age or more often. Preventing infection Hepatitis B  If you have a higher risk for hepatitis B, you should be screened for this virus. You are considered at high risk for hepatitis B if: ? You were born in a country where hepatitis B is common. Ask your health care provider which countries are considered high risk. ? Your parents were born in a high-risk country, and you have not been immunized against hepatitis B (hepatitis B vaccine). ? You have HIV or AIDS. ? You use needles to inject street drugs. ? You live with someone who has hepatitis B. ? You have had sex with someone who has hepatitis B. ? You get hemodialysis treatment. ? You take certain medicines for conditions, including cancer, organ transplantation, and autoimmune conditions. Hepatitis C  Blood testing is recommended for: ? Everyone born from [birth year] through 1964. ? Anyone with known risk factors for hepatitis C. Sexually transmitted infections (STIs)  You should be screened for sexually transmitted infections (STIs) including gonorrhea and chlamydia  if: ? You are sexually active and are younger than 32 years of age. ? You are older than 32 years of age and your health care provider tells you that you are at risk for this type of infection. ? Your sexual activity has changed since you were last screened and you are at an increased risk for chlamydia or gonorrhea. Ask your health care provider if you are at risk.  If you do not have HIV, but are at risk, it may  be recommended that you take a prescription medicine daily to prevent HIV infection. This is called pre-exposure prophylaxis (PrEP). You are considered at risk if: ? You are sexually active and do not regularly use condoms or know the HIV status of your partner(s). ? You take drugs by injection. ? You are sexually active with a partner who has HIV. Talk with your health care provider about whether you are at high risk of being infected with HIV. If you choose to begin PrEP, you should first be tested for HIV. You should then be tested every 3 months for as long as you are taking PrEP. Pregnancy  If you are premenopausal and you may become pregnant, ask your health care provider about preconception counseling.  If you may become pregnant, take 400 to 800 micrograms (mcg) of folic acid every day.  If you want to prevent pregnancy, talk to your health care provider about birth control (contraception). Osteoporosis and menopause  Osteoporosis is a disease in which the bones lose minerals and strength with aging. This can result in serious bone fractures. Your risk for osteoporosis can be identified using a bone density scan.  If you are 39 years of age or older, or if you are at risk for osteoporosis and fractures, ask your health care provider if you should be screened.  Ask your health care provider whether you should take a calcium or vitamin D supplement to lower your risk for osteoporosis.  Menopause may have certain physical symptoms and risks.  Hormone replacement therapy may  reduce some of these symptoms and risks. Talk to your health care provider about whether hormone replacement therapy is right for you. Follow these instructions at home:  Schedule regular health, dental, and eye exams.  Stay current with your immunizations.  Do not use any tobacco products including cigarettes, chewing tobacco, or electronic cigarettes.  If you are pregnant, do not drink alcohol.  If you are breastfeeding, limit how much and how often you drink alcohol.  Limit alcohol intake to no more than 1 drink per day for nonpregnant women. One drink equals 12 ounces of beer, 5 ounces of wine, or 1 ounces of hard liquor.  Do not use street drugs.  Do not share needles.  Ask your health care provider for help if you need support or information about quitting drugs.  Tell your health care provider if you often feel depressed.  Tell your health care provider if you have ever been abused or do not feel safe at home. This information is not intended to replace advice given to you by your health care provider. Make sure you discuss any questions you have with your health care provider. Document Released: 01/09/2011 Document Revised: 12/02/2015 Document Reviewed: 03/30/2015 Elsevier Interactive Patient Education  2019 [employer].

## 2018-12-26 ENCOUNTER — Ambulatory Visit (INDEPENDENT_AMBULATORY_CARE_PROVIDER_SITE_OTHER): Payer: 59 | Admitting: Internal Medicine

## 2018-12-26 ENCOUNTER — Encounter: Payer: Self-pay | Admitting: Internal Medicine

## 2018-12-26 DIAGNOSIS — B370 Candidal stomatitis: Secondary | ICD-10-CM

## 2018-12-26 DIAGNOSIS — Z3482 Encounter for supervision of other normal pregnancy, second trimester: Secondary | ICD-10-CM | POA: Diagnosis not present

## 2018-12-26 DIAGNOSIS — Z3483 Encounter for supervision of other normal pregnancy, third trimester: Secondary | ICD-10-CM | POA: Diagnosis not present

## 2018-12-26 MED ORDER — FLUCONAZOLE 150 MG PO TABS: 150.0000 mg | ORAL_TABLET | Freq: Every day | ORAL | 0 refills | Status: AC

## 2018-12-26 MED FILL — FLUCONAZOLE 150 MG TABS: 150 | 14 days supply | Qty: 14 | Fill #0

## 2018-12-26 NOTE — Progress Notes (Signed)
Virtual Visit via Video Note  I connected with Gaspar Garbe on 12/26/18 at  8:20 AM EDT by a video enabled telemedicine application and verified that I am speaking with the correct person using two identifiers.  The patient and the provider were at separate locations throughout the entire encounter.   I discussed the limitations of evaluation and management by telemedicine and the availability of in person appointments. The patient expressed understanding and agreed to proceed.  History of Present Illness: The patient is a 33 y.o. female with visit for oral thrush. Started during pregnancy while taking augmentin for tooth problem. She only took 1 day of the augmentin. She saw her ob who gave her nystatin liquid to swish and swallow QID which she took until bottle gone. She then had some resolution. It then came back with delivery which was about 1 month ago. She was then given diflucan 100 mg 10 day course with 2 refills from ob/gyn. She took this and the first course helped her. She is not sure if she was switching out her toothbrush during that time enough and it came back. She started taking the second course but this is not helping. The white plaque is worse at night time. She can brush it off with tooth brush. She is currently breastfeeding and no yeast there. No oral thrush for the baby girl. No vaginal yeast infection. Has no fevers or chills. Overall it is stable.   Observations/Objective: Appearance: normal, breathing appears normal, casual grooming, abdomen does not appear distended, throat with some white on the tongue mild, she has just brushed in the morning and it was worse per report on waking up, mental status is A and O times 3  Assessment and Plan: See problem oriented charting  Follow Up Instructions: rx diflucan 150 mg daily 14 days.   I discussed the assessment and treatment plan with the patient. The patient was provided an opportunity to ask questions and all were  answered. The patient agreed with the plan and demonstrated an understanding of the instructions.   The patient was advised to call back or seek an in-person evaluation if the symptoms worsen or if the condition fails to improve as anticipated.  Hoyt Koch, MD

## 2018-12-26 NOTE — Assessment & Plan Note (Signed)
Recent hiv screening in pregnancy negative. Likely related to the stress of recent delivery. Rx diflucan 150 mg daily 14 days. If no resolution needs in person visit to visualize and make sure it is removable.

## 2019-01-01 ENCOUNTER — Telehealth (HOSPITAL_COMMUNITY): Payer: Self-pay | Admitting: Lactation Services

## 2019-01-01 NOTE — Telephone Encounter (Signed)
Telephone call from mom of 61 weeks old.  She is returning to work in 2 weeks.  Mom has questions about giving a bottle and supplementing with formula.  Answered pumping questions and suggestions for giving a bottle.  Encouraged to call back if we can be of assistance.

## 2019-01-17 ENCOUNTER — Ambulatory Visit: Payer: 59 | Admitting: Obstetrics & Gynecology

## 2019-02-05 DIAGNOSIS — Z3482 Encounter for supervision of other normal pregnancy, second trimester: Secondary | ICD-10-CM | POA: Diagnosis not present

## 2019-02-05 DIAGNOSIS — Z3483 Encounter for supervision of other normal pregnancy, third trimester: Secondary | ICD-10-CM | POA: Diagnosis not present

## 2019-03-04 ENCOUNTER — Other Ambulatory Visit: Payer: Self-pay

## 2019-03-04 ENCOUNTER — Ambulatory Visit (INDEPENDENT_AMBULATORY_CARE_PROVIDER_SITE_OTHER): Payer: 59 | Admitting: Internal Medicine

## 2019-03-04 ENCOUNTER — Encounter: Payer: Self-pay | Admitting: Internal Medicine

## 2019-03-04 VITALS — BP 110/74 | HR 84 | Temp 98.4°F | Ht 65.0 in | Wt 169.0 lb

## 2019-03-04 DIAGNOSIS — Z0000 Encounter for general adult medical examination without abnormal findings: Secondary | ICD-10-CM | POA: Diagnosis not present

## 2019-03-04 DIAGNOSIS — Z87898 Personal history of other specified conditions: Secondary | ICD-10-CM

## 2019-03-04 NOTE — Assessment & Plan Note (Signed)
Had 1 seizure during c-section. Never recurrent, not on meds. Does not have seizure disorder.

## 2019-03-04 NOTE — Progress Notes (Signed)
   Subjective:   Patient ID: Sherry Sheppard, female    DOB: 09/17/1985, 33 y.o.   MRN: [id number]  HPI The patient is a 33 YO female coming in for physical. Recent baby May 2020, denies PPD.   PMH, [facility], social history reviewed and updated  Review of Systems  Constitutional: Negative.   HENT: Negative.   Eyes: Negative.   Respiratory: Negative for cough, chest tightness and shortness of breath.   Cardiovascular: Negative for chest pain, palpitations and leg swelling.  Gastrointestinal: Negative for abdominal distention, abdominal pain, constipation, diarrhea, nausea and vomiting.  Musculoskeletal: Negative.   Skin: Negative.   Neurological: Negative.   Psychiatric/Behavioral: Negative.     Objective:  Physical Exam Constitutional:      Appearance: She is well-developed.  HENT:     Head: Normocephalic and atraumatic.  Neck:     Musculoskeletal: Normal range of motion.  Cardiovascular:     Rate and Rhythm: Normal rate and regular rhythm.  Pulmonary:     Effort: Pulmonary effort is normal. No respiratory distress.     Breath sounds: Normal breath sounds. No wheezing or rales.  Abdominal:     General: Bowel sounds are normal. There is no distension.     Palpations: Abdomen is soft.     Tenderness: There is no abdominal tenderness. There is no rebound.  Skin:    General: Skin is warm and dry.  Neurological:     Mental Status: She is alert and oriented to person, place, and time.     Coordination: Coordination normal.     Vitals:   03/04/19 1306  BP: 110/74  Pulse: 84  Temp: 98.4 F (36.9 C)  TempSrc: Oral  SpO2: 98%  Weight: 169 lb (76.7 kg)  Height: 5\' 5"  (1.651 m)    Assessment & Plan:

## 2019-03-04 NOTE — Assessment & Plan Note (Signed)
Flu shot yearly through work. Tetanus up to date. Pap smear up to date with gyn. Negative for post partum depression. Counseled about sun safety and mole surveillance. Counseled about the dangers of distracted driving. Given 10 year screening recommendations.

## 2019-03-04 NOTE — Patient Instructions (Signed)
Health Maintenance, Female Adopting a healthy lifestyle and getting preventive care are important in promoting health and wellness. Ask your health care provider about:  The right schedule for you to have regular tests and exams.  Things you can do on your own to prevent diseases and keep yourself healthy. What should I know about diet, weight, and exercise? Eat a healthy diet   Eat a diet that includes plenty of vegetables, fruits, low-fat dairy products, and lean protein.  Do not eat a lot of foods that are high in solid fats, added sugars, or sodium. Maintain a healthy weight Body mass index (BMI) is used to identify weight problems. It estimates body fat based on height and weight. Your health care provider can help determine your BMI and help you achieve or maintain a healthy weight. Get regular exercise Get regular exercise. This is one of the most important things you can do for your health. Most adults should:  Exercise for at least 150 minutes each week. The exercise should increase your heart rate and make you sweat (moderate-intensity exercise).  Do strengthening exercises at least twice a week. This is in addition to the moderate-intensity exercise.  Spend less time sitting. Even light physical activity can be beneficial. Watch cholesterol and blood lipids Have your blood tested for lipids and cholesterol at 32 years of age, then have this test every 5 years. Have your cholesterol levels checked more often if:  Your lipid or cholesterol levels are high.  You are older than 32 years of age.  You are at high risk for heart disease. What should I know about cancer screening? Depending on your health history and family history, you may need to have cancer screening at various ages. This may include screening for:  Breast cancer.  Cervical cancer.  Colorectal cancer.  Skin cancer.  Lung cancer. What should I know about heart disease, diabetes, and high blood  pressure? Blood pressure and heart disease  High blood pressure causes heart disease and increases the risk of stroke. This is more likely to develop in people who have high blood pressure readings, are of African descent, or are overweight.  Have your blood pressure checked: ? Every 3-5 years if you are 18-39 years of age. ? Every year if you are 40 years old or older. Diabetes Have regular diabetes screenings. This checks your fasting blood sugar level. Have the screening done:  Once every three years after age 40 if you are at a normal weight and have a low risk for diabetes.  More often and at a younger age if you are overweight or have a high risk for diabetes. What should I know about preventing infection? Hepatitis B If you have a higher risk for hepatitis B, you should be screened for this virus. Talk with your health care provider to find out if you are at risk for hepatitis B infection. Hepatitis C Testing is recommended for:  Everyone born from 1945 through 1965.  Anyone with known risk factors for hepatitis C. Sexually transmitted infections (STIs)  Get screened for STIs, including gonorrhea and chlamydia, if: ? You are sexually active and are younger than 32 years of age. ? You are older than 32 years of age and your health care provider tells you that you are at risk for this type of infection. ? Your sexual activity has changed since you were last screened, and you are at increased risk for chlamydia or gonorrhea. Ask your health care provider if   you are at risk.  Ask your health care provider about whether you are at high risk for HIV. Your health care provider may recommend a prescription medicine to help prevent HIV infection. If you choose to take medicine to prevent HIV, you should first get tested for HIV. You should then be tested every 3 months for as long as you are taking the medicine. Pregnancy  If you are about to stop having your period (premenopausal) and  you may become pregnant, seek counseling before you get pregnant.  Take 400 to 800 micrograms (mcg) of folic acid every day if you become pregnant.  Ask for birth control (contraception) if you want to prevent pregnancy. Osteoporosis and menopause Osteoporosis is a disease in which the bones lose minerals and strength with aging. This can result in bone fractures. If you are 65 years old or older, or if you are at risk for osteoporosis and fractures, ask your health care provider if you should:  Be screened for bone loss.  Take a calcium or vitamin D supplement to lower your risk of fractures.  Be given hormone replacement therapy (HRT) to treat symptoms of menopause. Follow these instructions at home: Lifestyle  Do not use any products that contain nicotine or tobacco, such as cigarettes, e-cigarettes, and chewing tobacco. If you need help quitting, ask your health care provider.  Do not use street drugs.  Do not share needles.  Ask your health care provider for help if you need support or information about quitting drugs. Alcohol use  Do not drink alcohol if: ? Your health care provider tells you not to drink. ? You are pregnant, may be pregnant, or are planning to become pregnant.  If you drink alcohol: ? Limit how much you use to 0-1 drink a day. ? Limit intake if you are breastfeeding.  Be aware of how much alcohol is in your drink. In the [place] one drink equals one 12 oz bottle of beer (355 mL), one 5 oz glass of wine (148 mL), or one 1 oz glass of hard liquor (44 mL). General instructions  Schedule regular health, dental, and eye exams.  Stay current with your vaccines.  Tell your health care provider if: ? You often feel depressed. ? You have ever been abused or do not feel safe at home. Summary  Adopting a healthy lifestyle and getting preventive care are important in promoting health and wellness.  Follow your health care provider's instructions about healthy  diet, exercising, and getting tested or screened for diseases.  Follow your health care provider's instructions on monitoring your cholesterol and blood pressure. This information is not intended to replace advice given to you by your health care provider. Make sure you discuss any questions you have with your health care provider. Document Released: 01/09/2011 Document Revised: 06/19/2018 Document Reviewed: 06/19/2018 Elsevier Patient Education  2020 Elsevier Inc.  

## 2019-03-10 DIAGNOSIS — Z3482 Encounter for supervision of other normal pregnancy, second trimester: Secondary | ICD-10-CM | POA: Diagnosis not present

## 2019-03-10 DIAGNOSIS — Z3483 Encounter for supervision of other normal pregnancy, third trimester: Secondary | ICD-10-CM | POA: Diagnosis not present

## 2019-03-13 ENCOUNTER — Ambulatory Visit: Payer: 59 | Admitting: Obstetrics and Gynecology

## 2019-04-08 ENCOUNTER — Other Ambulatory Visit: Payer: Self-pay

## 2019-04-08 ENCOUNTER — Ambulatory Visit (INDEPENDENT_AMBULATORY_CARE_PROVIDER_SITE_OTHER): Payer: 59 | Admitting: Obstetrics and Gynecology

## 2019-04-08 ENCOUNTER — Encounter: Payer: Self-pay | Admitting: Obstetrics and Gynecology

## 2019-04-08 DIAGNOSIS — Z01419 Encounter for gynecological examination (general) (routine) without abnormal findings: Secondary | ICD-10-CM | POA: Insufficient documentation

## 2019-04-08 NOTE — Progress Notes (Signed)
Patient presents for Annual Exam Today.  Last Pap: 02/12/2018 WNL  LMP:No cycles with IUD  Contraception:IUD  STD Screening:  CC: Pt states she has been having a lot of hair loss. Wants to discuss labs

## 2019-04-08 NOTE — Progress Notes (Signed)
Patient ID: Sherry Sheppard, female   DOB: August 08, 1985, 33 y.o.   MRN: [id number]  Sherry Sheppard is a 33 y.o. [id number] female here for a routine annual gynecologic exam.  Current complaints: some hair loss since delivery this past May.   Denies abnormal vaginal bleeding, discharge, pelvic pain, problems with intercourse or other gynecologic concerns.   Received Flu vaccine with PCP  Gynecologic History No LMP recorded. (Menstrual status: IUD). Contraception: IUD Last Pap: 02/2018. Results were: normal Last mammogram: NA.   Obstetric History OB History  Gravida Para Term Preterm AB Living  4 2 2   1 2   SAB TAB Ectopic Multiple Live Births  1       2    # Outcome Date GA Lbr Len/2nd Weight Sex Delivery Anes PTL Lv  4 Gravida           3 Term 08/24/12 [redacted]w[redacted]d  8 lb 0.8 oz (3.65 kg) M CS-Vac Spinal  LIV     Birth Comments: had seizure during last CS had an AVM completely resolved as of 6/19 had surgery 12/2012  2 SAB 2013             Birth Comments: System Generated. Please review and update pregnancy details.  1 Term 04/07/08    M CS-LTranv   LIV     Birth Comments: c/S because FTP and fetal distress    Past Medical History:  Diagnosis Date  . Allergic rhinitis   . AVM (arteriovenous malformation) brain   . Migraines   . Seizures ([place])    last event 08/24/12  . Vaginal Pap smear, abnormal     Past Surgical History:  Procedure Laterality Date  . BRAIN AVM REPAIR  12/2012   gamma radio knife @ [facility]  2009  . CESAREAN SECTION N/A 08/24/2012   Procedure: CESAREAN SECTION;  Surgeon: Frederico Hamman, MD;  Location: [facility];  Service: Obstetrics;  Laterality: N/A;  . CESAREAN SECTION N/A 11/10/2018   Procedure: CESAREAN SECTION;  Surgeon: Chancy Milroy, MD;  Location: [facility];  Service: Obstetrics;  Laterality: N/A;  . DILATION AND EVACUATION  09/20/2011   Procedure: DILATATION AND EVACUATION;  Surgeon: Shelly Bombard, MD;  Location: [facility];  Service:  Gynecology;  Laterality: N/A;  . TOOTH EXTRACTION  2012    Current Outpatient Medications on File Prior to Visit  Medication Sig Dispense Refill  . levonorgestrel (MIRENA) 20 MCG/24HR IUD 1 each by Intrauterine route once.    Mariane Baumgarten Sodium (COLACE PO) Take by mouth 2 (two) times daily.    . Ferrous Sulfate (IRON PO) Take by mouth.     No current facility-administered medications on file prior to visit.     Allergies  Allergen Reactions  . Banana Shortness Of Breath  . Other Nausea And Vomiting    Contrast dye  . Procardia [Nifedipine] Palpitations    Social History   Socioeconomic History  . Marital status: Married    Spouse name: Not on file  . Number of children: 2  . Years of education: Not on file  . Highest education level: Not on file  Occupational History  . Occupation: Research scientist (life sciences): [employer]  Social Needs  . Financial resource strain: Not hard at all  . Food insecurity    Worry: Never true    Inability: Never true  . Transportation needs    Medical: No  Non-medical: No  Tobacco Use  . Smoking status: Never Smoker  . Smokeless tobacco: Never Used  Substance and Sexual Activity  . Alcohol use: No    Alcohol/week: 0.0 standard drinks  . Drug use: No  . Sexual activity: Yes    Partners: Male    Birth control/protection: I.U.D.  Lifestyle  . Physical activity    Days per week: 1 day    Minutes per session: 30 min  . Stress: Not at all  Relationships  . Social connections    Talks on phone: More than three times a week    Gets together: Three times a week    Attends religious service: More than 4 times per year    Active member of club or organization: No    Attends meetings of clubs or organizations: Never    Relationship status: Not on file  . Intimate partner violence    Fear of current or ex partner: No    Emotionally abused: No    Physically abused: No    Forced sexual activity: No  Other Topics  Concern  . 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   Single, lives with sons    Family History  Problem Relation Age of Onset  . Hyperlipidemia Mother   . Diabetes Mother   . Hypertension Father   . Lung cancer Maternal Grandmother 37  . Hypertension Maternal Aunt   . Diabetes Cousin     The following portions of the patient's history were reviewed and updated as appropriate: allergies, current medications, past family history, past medical history, past social history, past surgical history and problem list.  Review of Systems Pertinent items noted in HPI and remainder of comprehensive ROS otherwise negative.   Objective:  BP 107/71   Pulse (!) 106   Wt 171 lb (77.6 kg)   Breastfeeding Yes   BMI 28.46 kg/m  CONSTITUTIONAL: Well-developed, well-nourished female in no acute distress.  HENT:  Normocephalic, atraumatic, External right and left ear normal. Oropharynx is clear and moist EYES: Conjunctivae and EOM are normal. Pupils are equal, round, and reactive to light. No scleral icterus.  NECK: Normal range of motion, supple, no masses.  Normal thyroid.  SKIN: Skin is warm and dry. No rash noted. Not diaphoretic. No erythema. No pallor. NEUROLGIC: Alert and oriented to person, place, and time. Normal reflexes, muscle tone coordination. No cranial nerve deficit noted. PSYCHIATRIC: Normal mood and affect. Normal behavior. Normal judgment and thought content. CARDIOVASCULAR: Normal heart rate noted, regular rhythm RESPIRATORY: Clear to auscultation bilaterally. Effort and breath sounds normal, no problems with respiration noted. BREASTS: Symmetric in size. No masses, skin changes, nipple drainage, or lymphadenopathy. ABDOMEN: Soft, normal bowel sounds, no distention noted.  No tenderness, rebound or guarding.  PELVIC: Normal appearing external genitalia; normal appearing vaginal mucosa and cervix.  No abnormal discharge noted.  Pap  smear not obtained.  Normal uterine size, no other palpable masses, no uterine or adnexal tenderness. MUSCULOSKELETAL: Normal range of motion. No tenderness.  No cyanosis, clubbing, or edema.  2+ distal pulses.   Assessment:  Annual gynecologic examination (pap smear not indicated) Hair loss suspect hormonal related Plan:  Pt reassured in regards to hair loss. Will monitor for now. Routine preventative health maintenance measures emphasized. Please refer to After Visit Summary for other counseling recommendations.    Hermina Staggers, MD, FACOG Attending Obstetrician & Gynecologist Center for [facility],  [place]

## 2019-04-08 NOTE — Patient Instructions (Signed)
Health Maintenance, Female Adopting a healthy lifestyle and getting preventive care are important in promoting health and wellness. Ask your health care provider about:  The right schedule for you to have regular tests and exams.  Things you can do on your own to prevent diseases and keep yourself healthy. What should I know about diet, weight, and exercise? Eat a healthy diet   Eat a diet that includes plenty of vegetables, fruits, low-fat dairy products, and lean protein.  Do not eat a lot of foods that are high in solid fats, added sugars, or sodium. Maintain a healthy weight Body mass index (BMI) is used to identify weight problems. It estimates body fat based on height and weight. Your health care provider can help determine your BMI and help you achieve or maintain a healthy weight. Get regular exercise Get regular exercise. This is one of the most important things you can do for your health. Most adults should:  Exercise for at least 150 minutes each week. The exercise should increase your heart rate and make you sweat (moderate-intensity exercise).  Do strengthening exercises at least twice a week. This is in addition to the moderate-intensity exercise.  Spend less time sitting. Even light physical activity can be beneficial. Watch cholesterol and blood lipids Have your blood tested for lipids and cholesterol at 32 years of age, then have this test every 5 years. Have your cholesterol levels checked more often if:  Your lipid or cholesterol levels are high.  You are older than 32 years of age.  You are at high risk for heart disease. What should I know about cancer screening? Depending on your health history and family history, you may need to have cancer screening at various ages. This may include screening for:  Breast cancer.  Cervical cancer.  Colorectal cancer.  Skin cancer.  Lung cancer. What should I know about heart disease, diabetes, and high blood  pressure? Blood pressure and heart disease  High blood pressure causes heart disease and increases the risk of stroke. This is more likely to develop in people who have high blood pressure readings, are of African descent, or are overweight.  Have your blood pressure checked: ? Every 3-5 years if you are 18-39 years of age. ? Every year if you are 40 years old or older. Diabetes Have regular diabetes screenings. This checks your fasting blood sugar level. Have the screening done:  Once every three years after age 40 if you are at a normal weight and have a low risk for diabetes.  More often and at a younger age if you are overweight or have a high risk for diabetes. What should I know about preventing infection? Hepatitis B If you have a higher risk for hepatitis B, you should be screened for this virus. Talk with your health care provider to find out if you are at risk for hepatitis B infection. Hepatitis C Testing is recommended for:  Everyone born from 1945 through 1965.  Anyone with known risk factors for hepatitis C. Sexually transmitted infections (STIs)  Get screened for STIs, including gonorrhea and chlamydia, if: ? You are sexually active and are younger than 32 years of age. ? You are older than 32 years of age and your health care provider tells you that you are at risk for this type of infection. ? Your sexual activity has changed since you were last screened, and you are at increased risk for chlamydia or gonorrhea. Ask your health care provider if   you are at risk.  Ask your health care provider about whether you are at high risk for HIV. Your health care provider may recommend a prescription medicine to help prevent HIV infection. If you choose to take medicine to prevent HIV, you should first get tested for HIV. You should then be tested every 3 months for as long as you are taking the medicine. Pregnancy  If you are about to stop having your period (premenopausal) and  you may become pregnant, seek counseling before you get pregnant.  Take 400 to 800 micrograms (mcg) of folic acid every day if you become pregnant.  Ask for birth control (contraception) if you want to prevent pregnancy. Osteoporosis and menopause Osteoporosis is a disease in which the bones lose minerals and strength with aging. This can result in bone fractures. If you are 65 years old or older, or if you are at risk for osteoporosis and fractures, ask your health care provider if you should:  Be screened for bone loss.  Take a calcium or vitamin D supplement to lower your risk of fractures.  Be given hormone replacement therapy (HRT) to treat symptoms of menopause. Follow these instructions at home: Lifestyle  Do not use any products that contain nicotine or tobacco, such as cigarettes, e-cigarettes, and chewing tobacco. If you need help quitting, ask your health care provider.  Do not use street drugs.  Do not share needles.  Ask your health care provider for help if you need support or information about quitting drugs. Alcohol use  Do not drink alcohol if: ? Your health care provider tells you not to drink. ? You are pregnant, may be pregnant, or are planning to become pregnant.  If you drink alcohol: ? Limit how much you use to 0-1 drink a day. ? Limit intake if you are breastfeeding.  Be aware of how much alcohol is in your drink. In the [place] one drink equals one 12 oz bottle of beer (355 mL), one 5 oz glass of wine (148 mL), or one 1 oz glass of hard liquor (44 mL). General instructions  Schedule regular health, dental, and eye exams.  Stay current with your vaccines.  Tell your health care provider if: ? You often feel depressed. ? You have ever been abused or do not feel safe at home. Summary  Adopting a healthy lifestyle and getting preventive care are important in promoting health and wellness.  Follow your health care provider's instructions about healthy  diet, exercising, and getting tested or screened for diseases.  Follow your health care provider's instructions on monitoring your cholesterol and blood pressure. This information is not intended to replace advice given to you by your health care provider. Make sure you discuss any questions you have with your health care provider. Document Released: 01/09/2011 Document Revised: 06/19/2018 Document Reviewed: 06/19/2018 Elsevier Patient Education  2020 Elsevier Inc.  

## 2019-04-25 DIAGNOSIS — H53461 Homonymous bilateral field defects, right side: Secondary | ICD-10-CM | POA: Diagnosis not present

## 2019-04-25 DIAGNOSIS — H53462 Homonymous bilateral field defects, left side: Secondary | ICD-10-CM | POA: Diagnosis not present

## 2019-04-25 DIAGNOSIS — H18603 Keratoconus, unspecified, bilateral: Secondary | ICD-10-CM | POA: Diagnosis not present

## 2019-04-25 DIAGNOSIS — H53469 Homonymous bilateral field defects, unspecified side: Secondary | ICD-10-CM | POA: Diagnosis not present

## 2019-04-30 DIAGNOSIS — Q282 Arteriovenous malformation of cerebral vessels: Secondary | ICD-10-CM | POA: Diagnosis not present

## 2019-04-30 DIAGNOSIS — R519 Headache, unspecified: Secondary | ICD-10-CM | POA: Diagnosis not present

## 2019-04-30 MED FILL — PROPRANOLOL 20 MG TABLET: 20 | 30 days supply | Qty: 60 | Fill #0

## 2019-04-30 MED FILL — ALPRAZolam 1 MG TABS: 1 | 1 days supply | Qty: 2 | Fill #0

## 2019-05-07 DIAGNOSIS — Z3483 Encounter for supervision of other normal pregnancy, third trimester: Secondary | ICD-10-CM | POA: Diagnosis not present

## 2019-05-07 DIAGNOSIS — Z3482 Encounter for supervision of other normal pregnancy, second trimester: Secondary | ICD-10-CM | POA: Diagnosis not present

## 2019-07-10 IMAGING — US US OB COMP LESS 14 WK
1 series · 15 of 28 positions shown · non-contrast
Comparison: None.

CLINICAL DATA: Vaginal bleeding. Estimated gestational age of 10
weeks, 1 day by first ultrasound.

EXAM:
OBSTETRIC <14 WK ULTRASOUND
TECHNIQUE: Transabdominal ultrasound was performed for evaluation of the
gestation as well as the maternal uterus and adnexal regions.

[Series 1: us ob comp less 14 wk · 28 acquisitions, 15 frames shown]
[im 1/28]
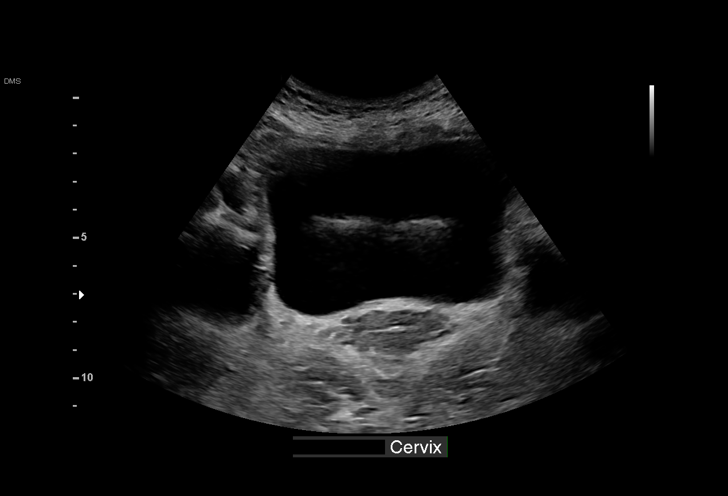
[im 3/28]
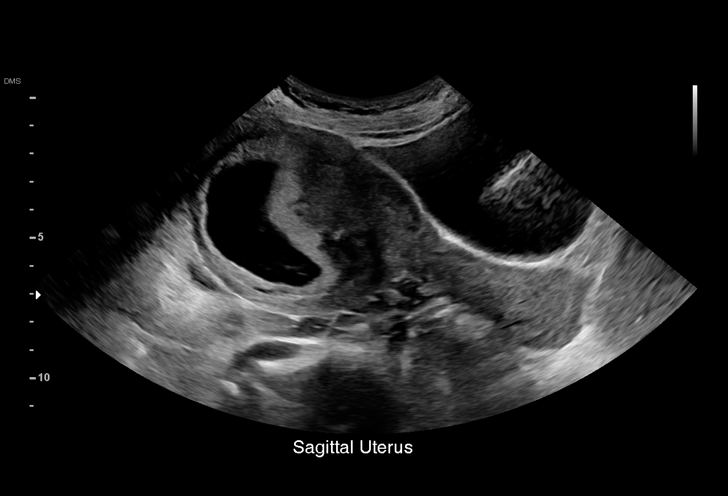
[im 5/28]
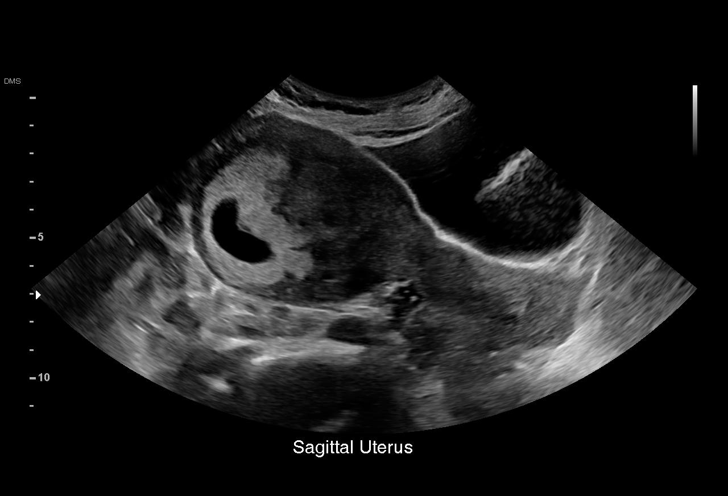
[im 7/28]
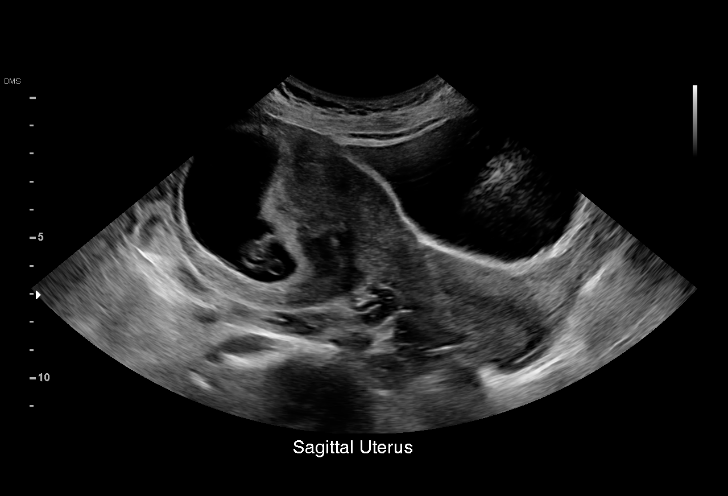
[im 9/28]
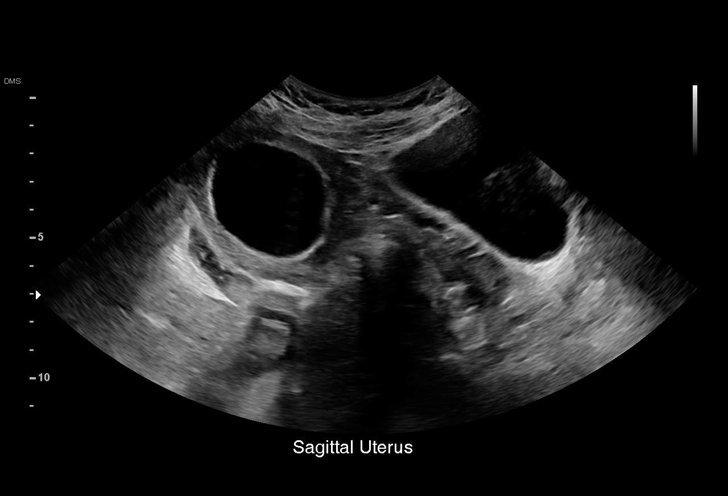
[im 11/28]
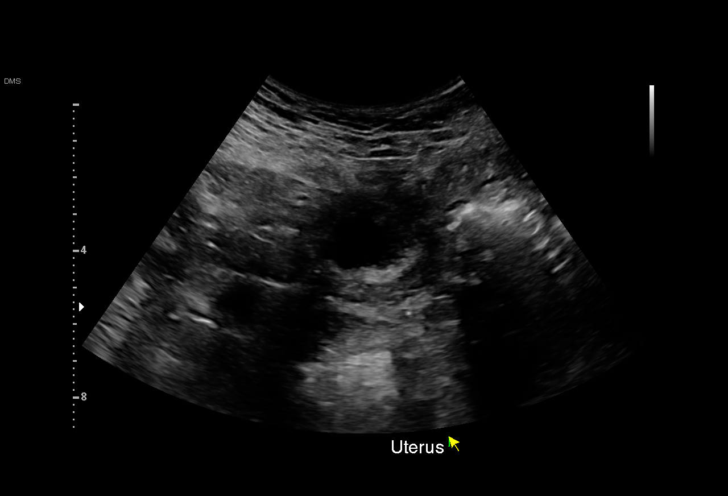
[im 13/28]
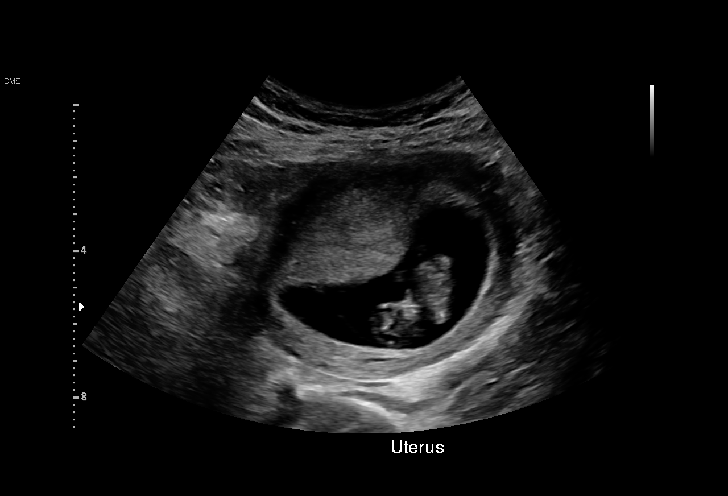
[im 15/28]
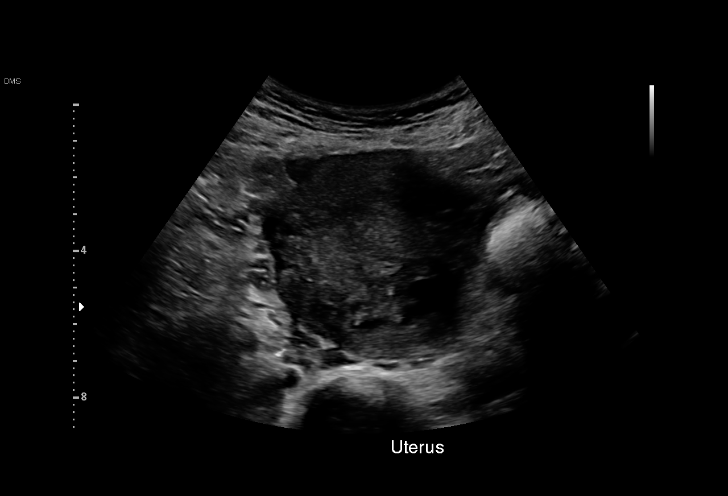
[im 16/28]
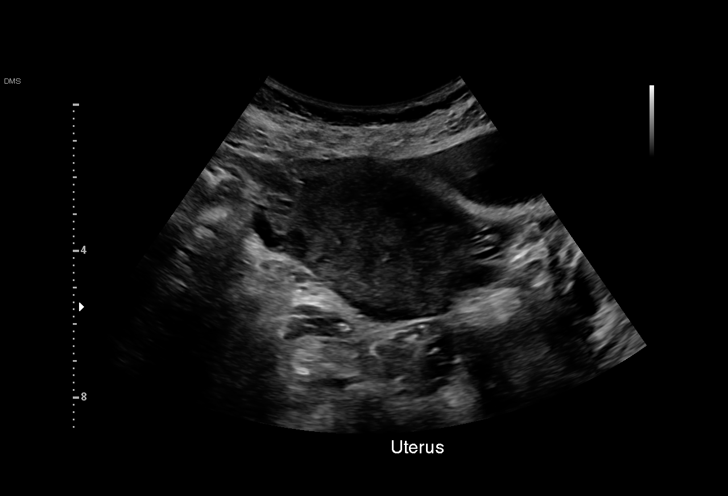
[im 18/28]
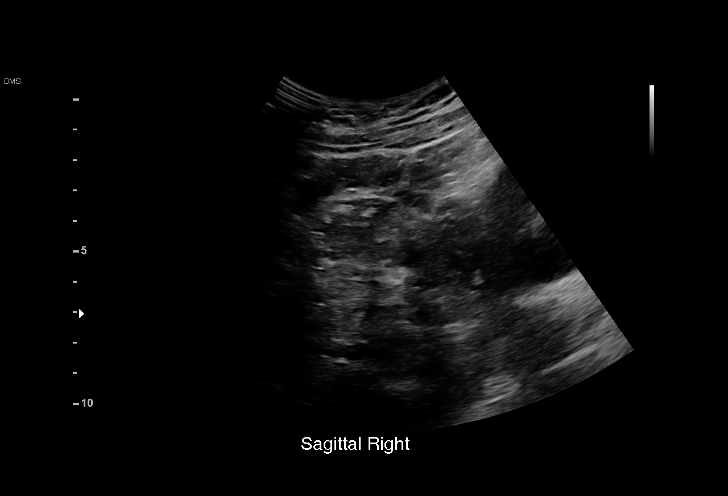
[im 20/28]
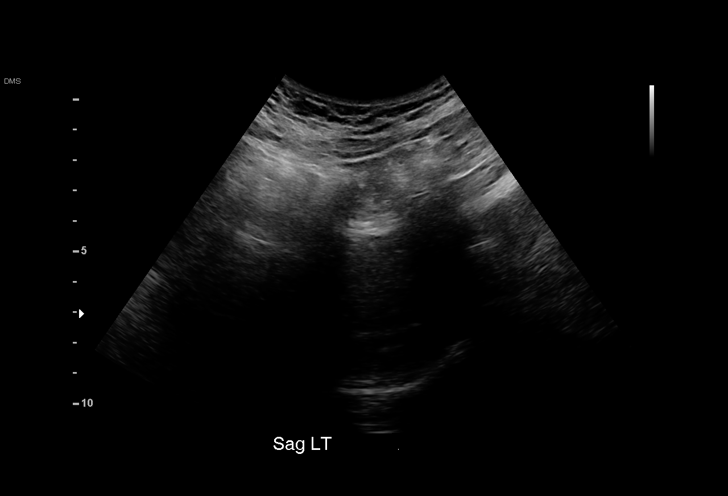
[im 22/28]
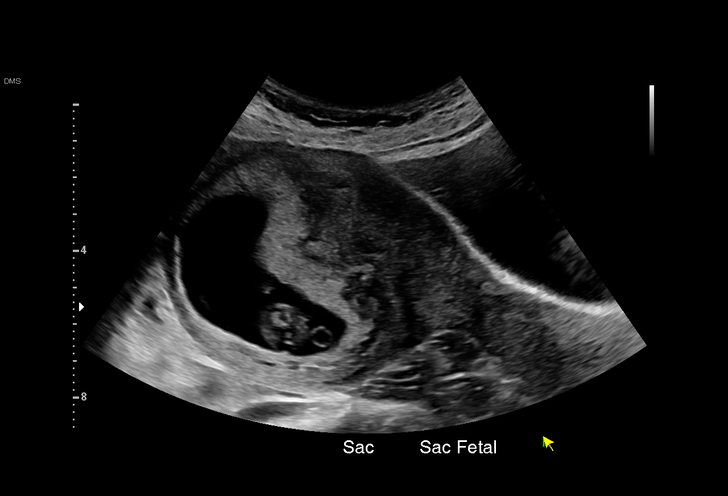
[im 24/28]
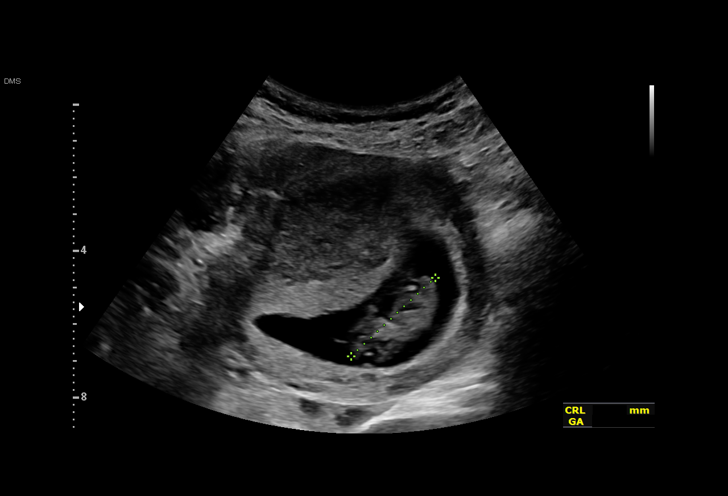
[im 26/28]
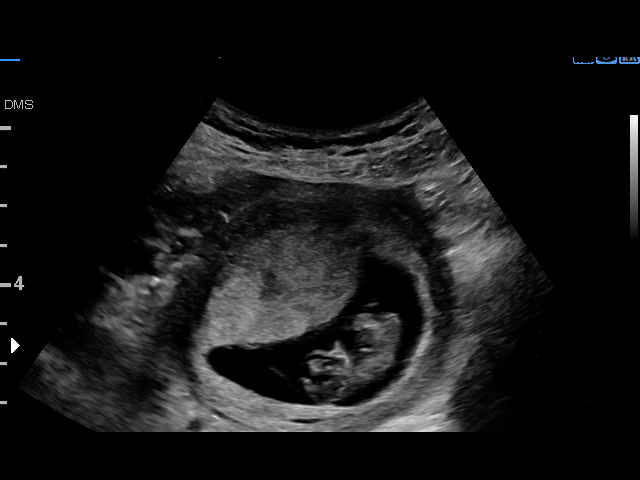
[im 28/28]
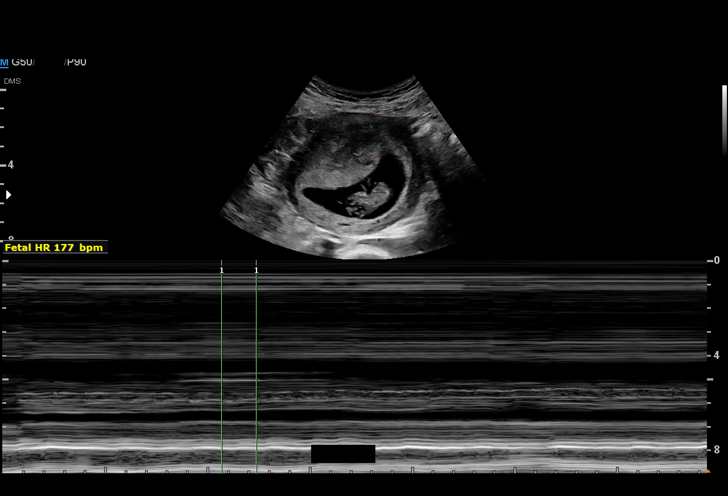

[15 of 28 positions shown; findings below may reference images not displayed]

FINDINGS: Intrauterine gestational sac: Single

Yolk sac:  Visualized.

Embryo:  Visualized.

Cardiac Activity: Visualized.

Heart Rate: 176 bpm

CRL:   32.83 mm   10 w 1 d                  US EDC: 11/18/2018

Subchorionic hemorrhage:  None visualized.

Maternal uterus/adnexae: Cervix is closed. The bilateral ovaries are
not visualized.
IMPRESSION: 1. Single, live intrauterine pregnancy with estimated gestational
age of 10 weeks, 1 day. No acute abnormality.

## 2019-10-26 IMAGING — US US MFM OB FOLLOW-UP
1 series · 14 of 28 positions shown · non-contrast
Comparison: none

[Series 1: us mfm ob follow-up · 14 of 42 slices shown]
[im 2/42]
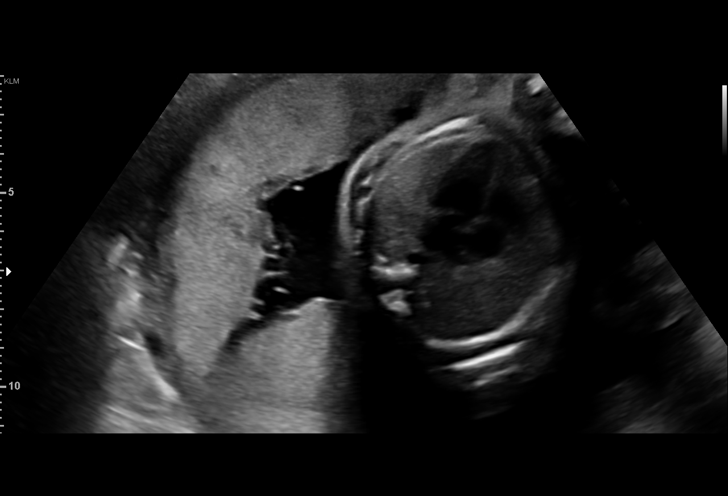
[im 5/42]
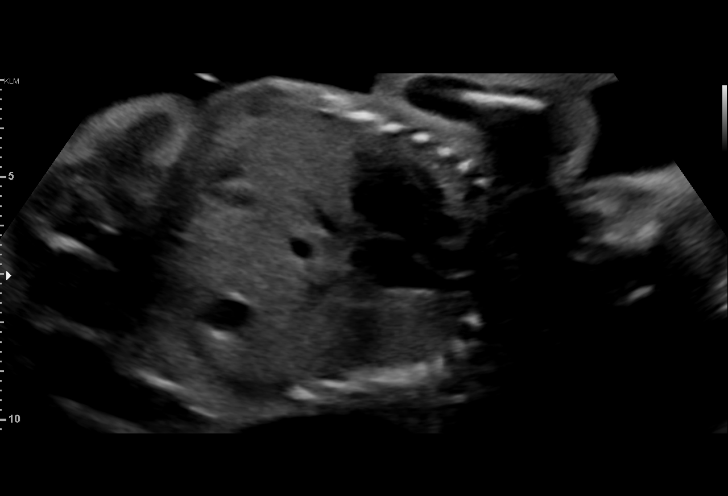
[im 8/42]
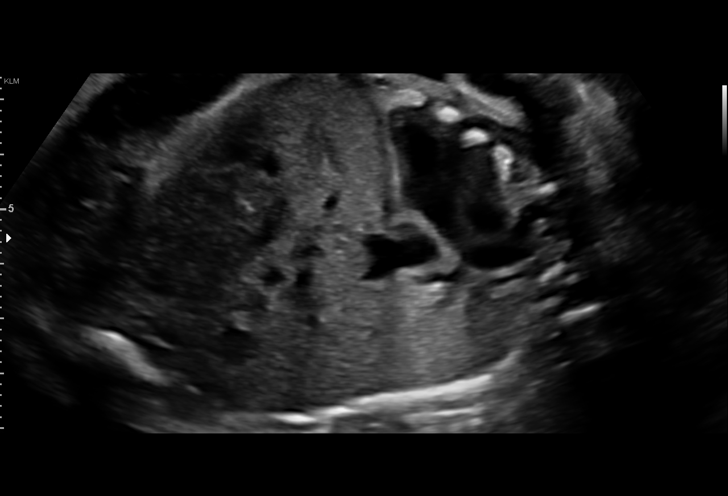
[im 11/42]
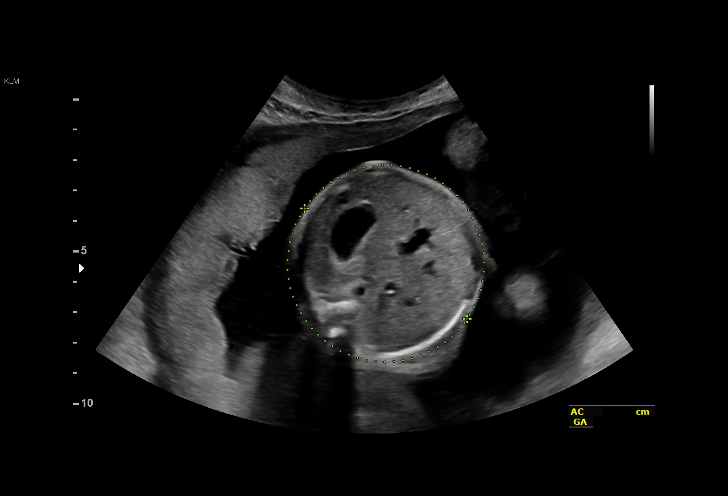
[im 14/42]
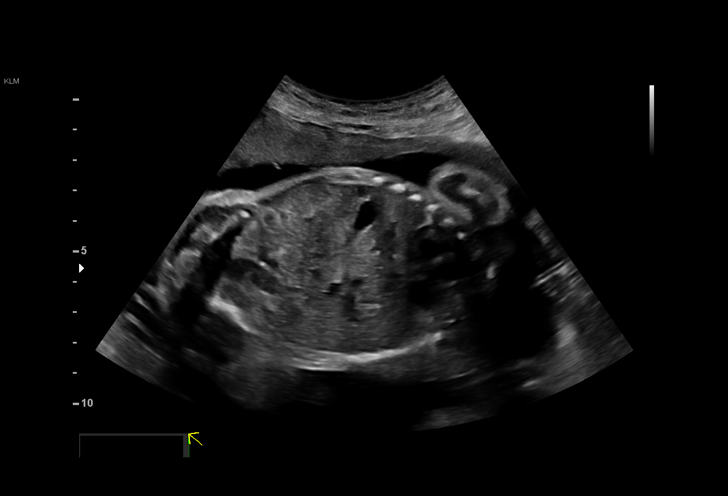
[im 17/42]
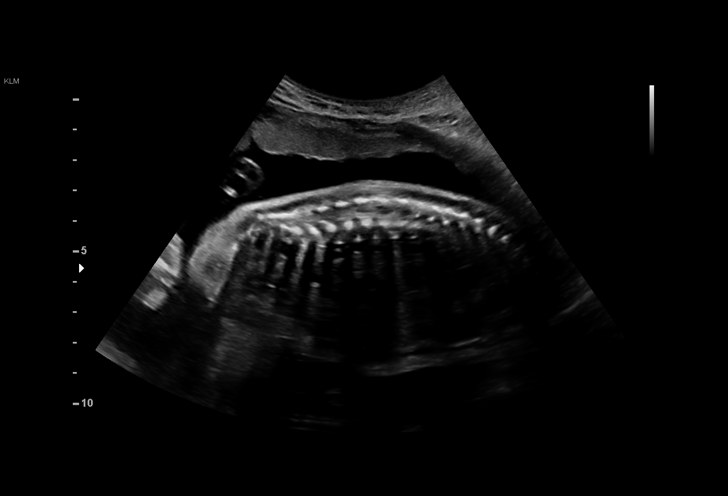
[im 20/42]
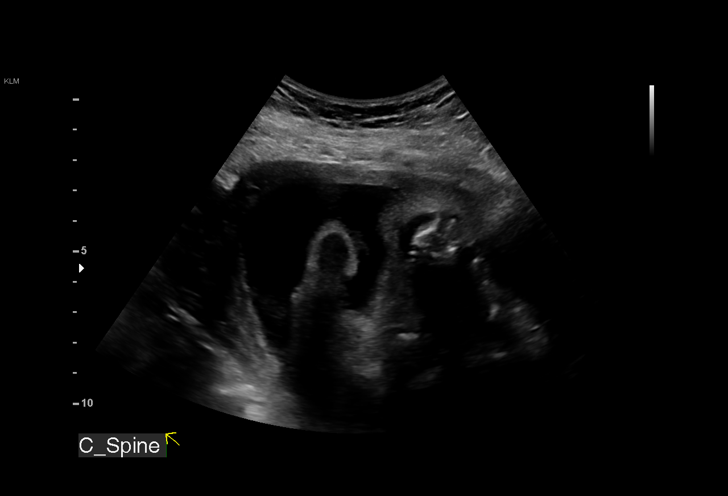
[im 23/42]
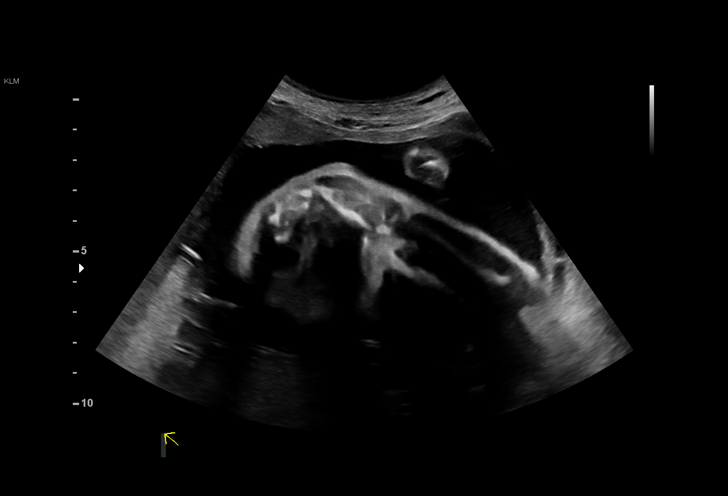
[im 26/42]
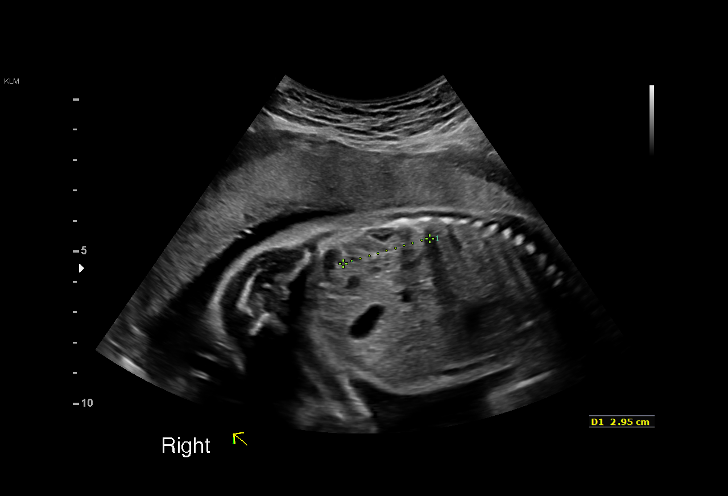
[im 29/42]
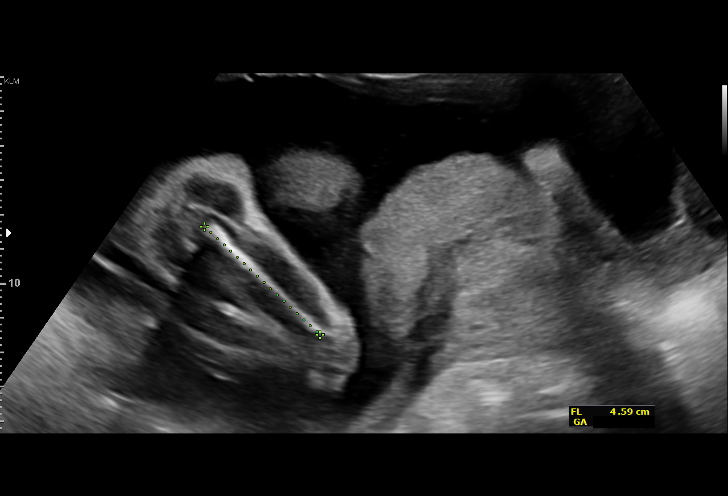
[im 32/42]
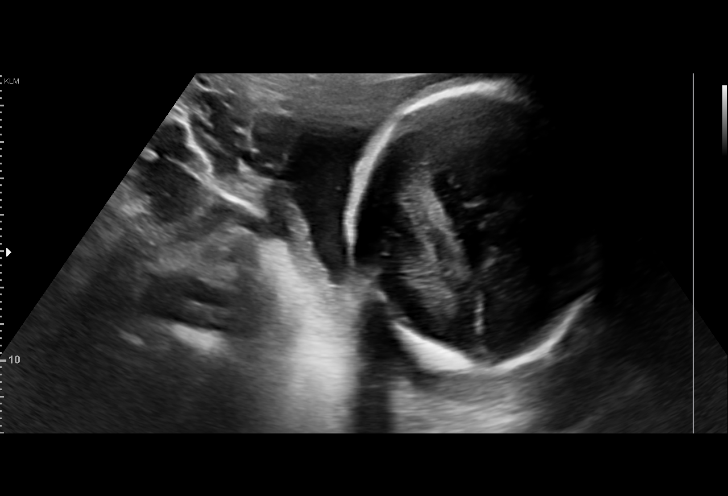
[im 35/42]
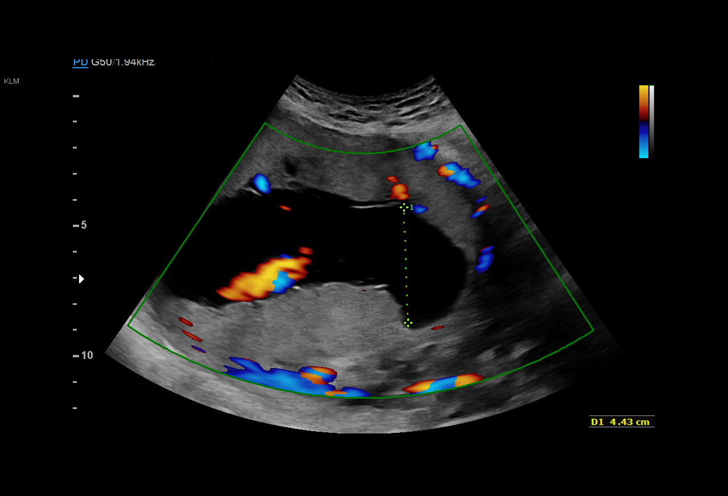
[im 38/42]
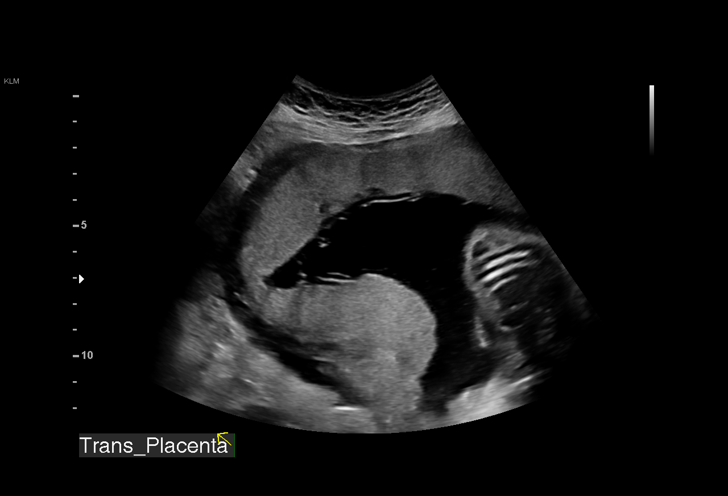
[im 42/42]
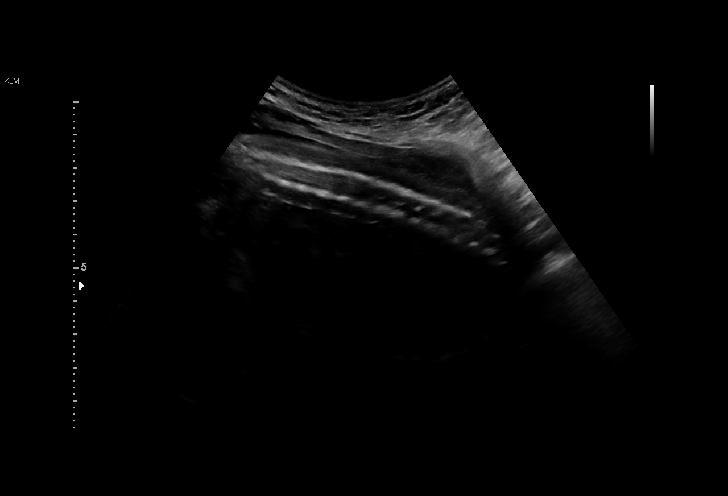

[14 of 28 positions shown; findings below may reference images not displayed]

[REDACTED]care - [HOSPITAL]

 ----------------------------------------------------------------------

 ----------------------------------------------------------------------
Indications

  Antenatal follow-up for nonvisualized fetal
  anatomy
  24 weeks gestation of pregnancy
  Previous cesarean delivery, antepartum x 2
  Seizure disorder (no meds - last episode
  0730)
  Medical complication of pregnancy (hx AVM -
  ablation repair 0730)
 ----------------------------------------------------------------------
Fetal Evaluation

 Num Of Fetuses:          1
 Fetal Heart Rate(bpm):   150
 Cardiac Activity:        Observed
 Presentation:            Cephalic
 Placenta:                Anterior
 P. Cord Insertion:       Visualized

 Amniotic Fluid
 AFI FV:      Within normal limits

                             Largest Pocket(cm)

Biometry

 BPD:      66.5  mm     G. Age:  26w 6d         96  %    CI:        81.32   %    70 - 86
                                                         FL/HC:       19.5  %    18.7 -
 HC:      232.8  mm     G. Age:  25w 2d         52  %    HC/AC:       1.12       1.04 -
 AC:       208   mm     G. Age:  25w 3d         61  %    FL/BPD:      68.1  %    71 - 87
 FL:       45.3  mm     G. Age:  25w 0d         46  %    FL/AC:       21.8  %    20 - 24

 Est. FW:     793   gm   1 lb 12 oz      64  %
OB History

 Gravidity:    4         Term:   2        Prem:   0        SAB:   1
 TOP:          0       Ectopic:  0        Living: 2
Gestational Age

 LMP:           24w 5d        Date:  02/17/18                 EDD:   11/24/18
 U/S Today:     25w 5d                                        EDD:   11/17/18
 Best:          24w 5d     Det. By:  LMP  (02/17/18)          EDD:   11/24/18
Anatomy

 Cranium:               Appears normal         LVOT:                   Appears normal
 Cavum:                 Previously seen        Aortic Arch:            Previously seen
 Ventricles:            Previously seen        Ductal Arch:            Previously seen
 Choroid Plexus:        Previously seen        Diaphragm:              Appears normal
 Cerebellum:            Previously seen        Stomach:                Appears normal, left
                                                                       sided
 Posterior Fossa:       Previously seen        Abdomen:                Appears normal
 Nuchal Fold:           Previously seen        Abdominal Wall:         Previously seen
 Face:                  Orbits and profile     Cord Vessels:           Previously seen
                        previously seen
 Lips:                  Previously seen        Kidneys:                Appear normal
 Palate:                Previously seen        Bladder:                Appears normal
 Thoracic:              Appears normal         Spine:                  Appears normal
 Heart:                 Appears normal         Upper Extremities:      Previously seen
                        (4CH, axis, and
                        situs)
 RVOT:                  Previously seen        Lower Extremities:      Previously seen

 Other:  Heels and 5th digit previously visualized. Nasal bone previously
         visualized. Technically difficult due to fetal position.
Cervix Uterus Adnexa

 Cervix
 Not visualized (advanced GA >43wks)
Impression

 Normal interval growth.
Recommendations

 Follow up as clinically indicated.

## 2020-01-23 ENCOUNTER — Telehealth: Payer: Self-pay | Admitting: Internal Medicine

## 2020-01-23 DIAGNOSIS — L700 Acne vulgaris: Secondary | ICD-10-CM | POA: Diagnosis not present

## 2020-01-23 DIAGNOSIS — L219 Seborrheic dermatitis, unspecified: Secondary | ICD-10-CM | POA: Diagnosis not present

## 2020-01-23 MED FILL — KETOCONAZOLE 2% SHAMPOO: 2 | 30 days supply | Qty: 120 | Fill #0

## 2020-01-23 NOTE — Telephone Encounter (Signed)
    Please enter order for annual labs. Appointment scheduled 8/27

## 2020-01-27 IMAGING — US US MFM FETAL BPP WO NON STRESS
1 series · 15 of 28 positions shown · non-contrast
Comparison: none

[Series 1: us mfm fetal bpp wo non stress · 34 acquisitions, 15 frames shown]
[im 1/34]
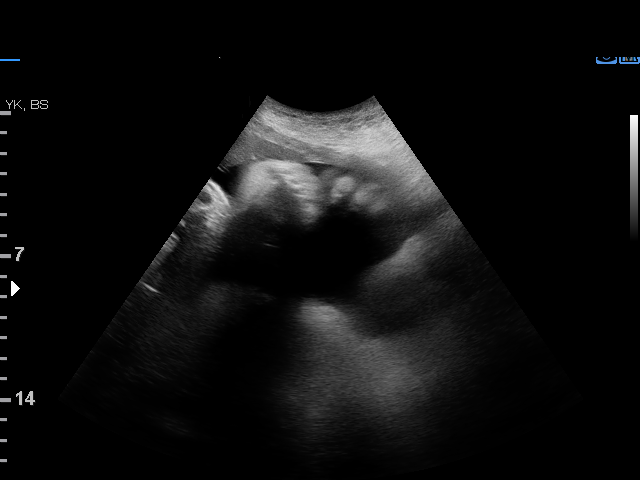
[im 3/34]
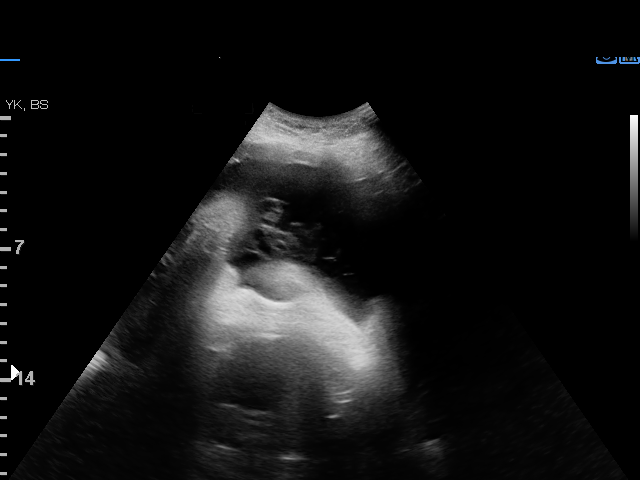
[im 5/34]
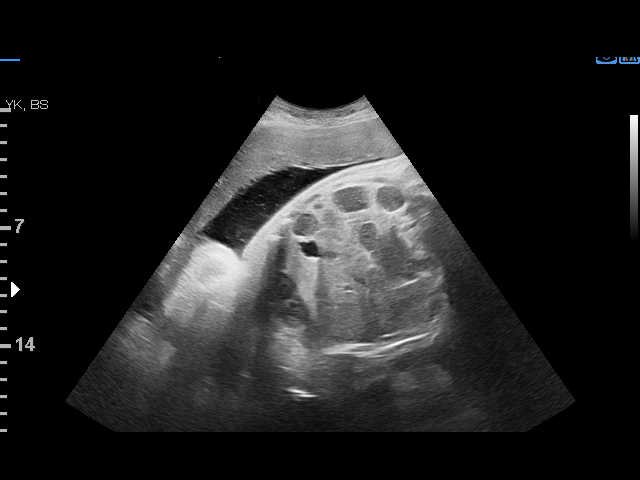
[im 8/34]
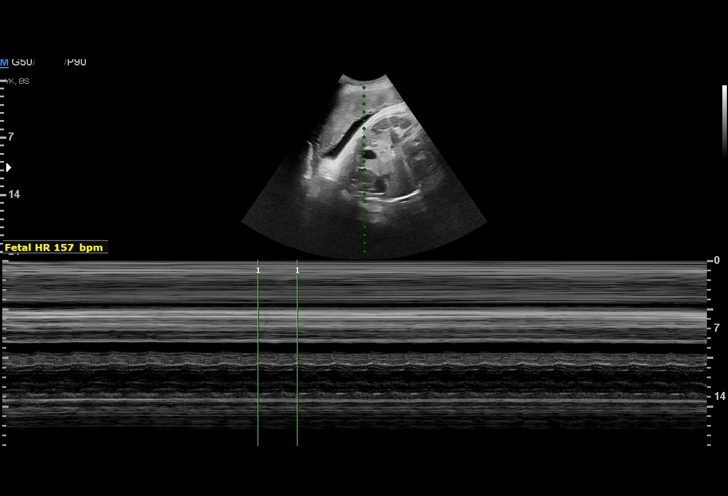
[im 10/34]
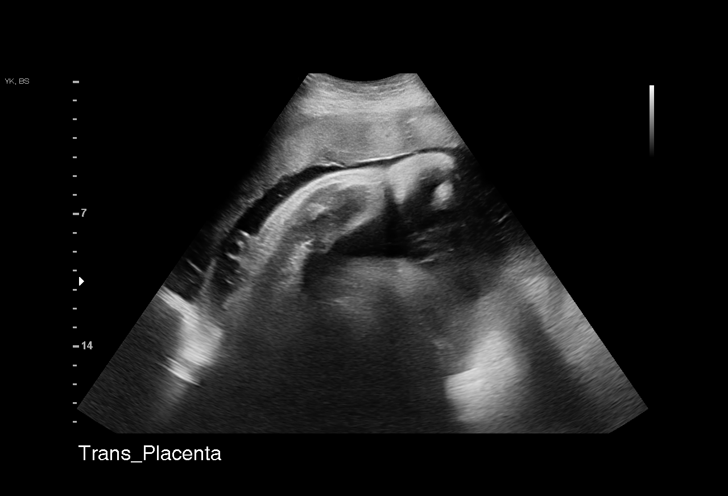
[im 13/34]
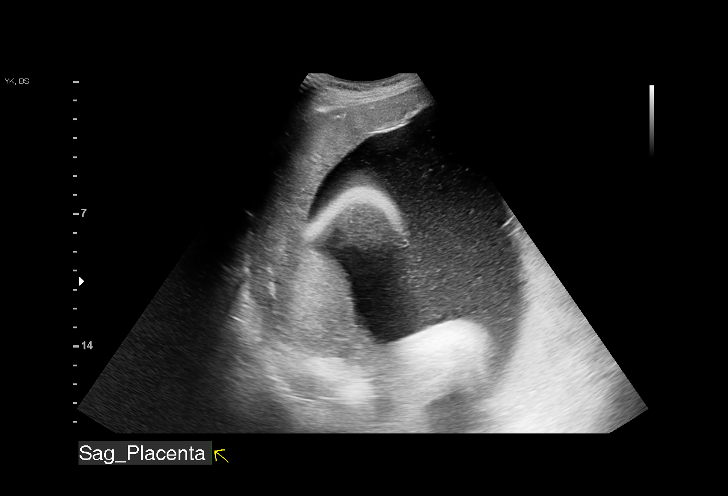
[im 15/34]
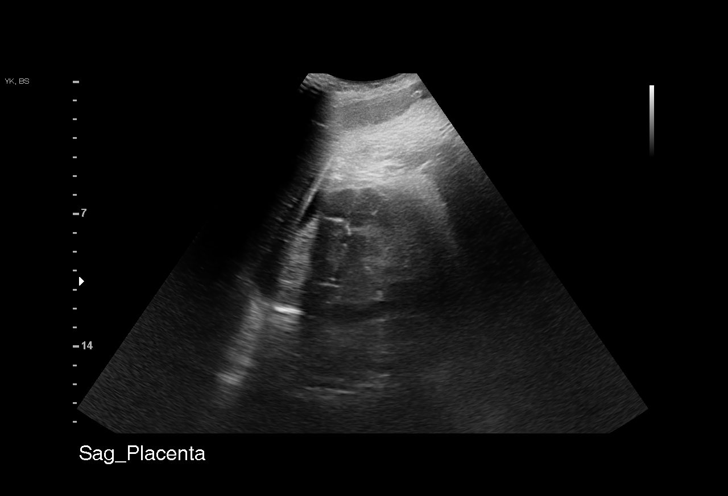
[im 18/34]
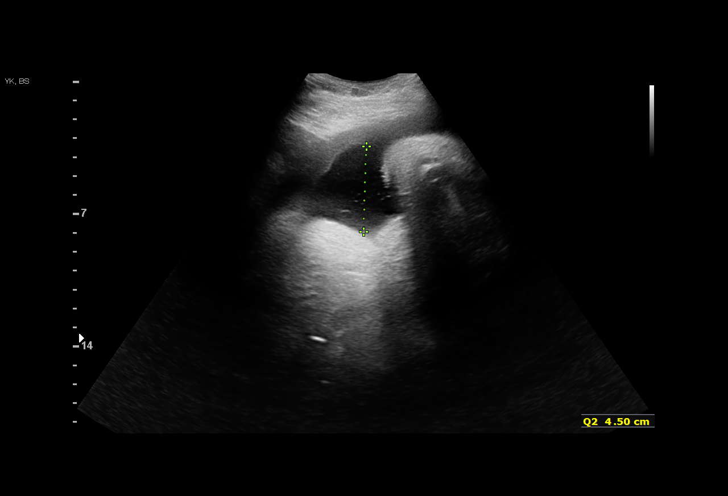
[im 19/34]
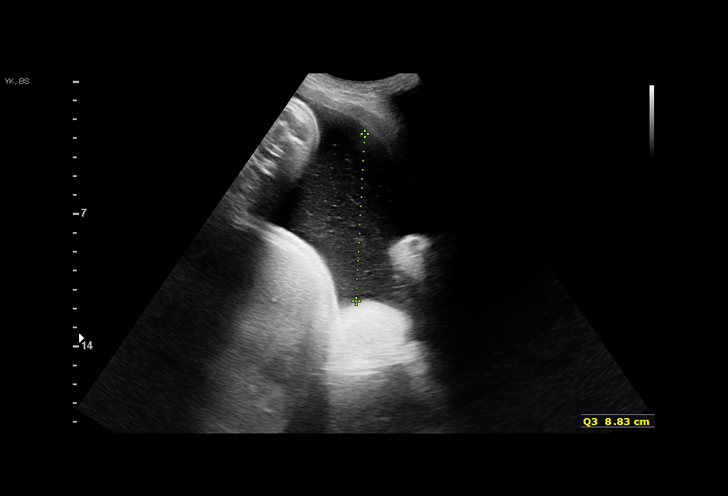
[im 21/34]
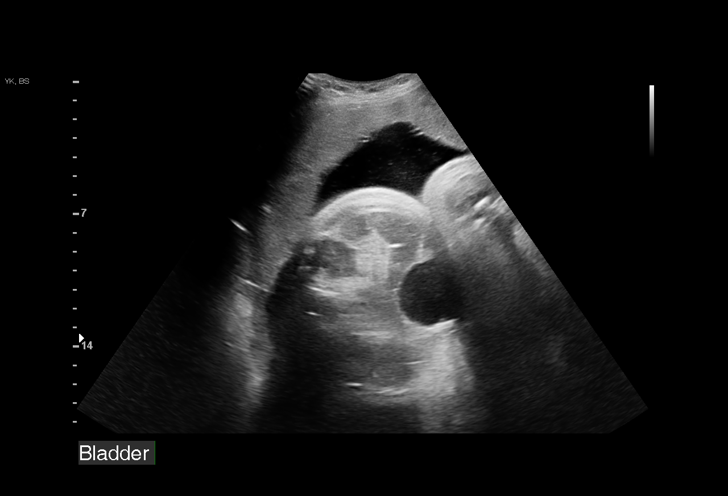
[im 24/34]
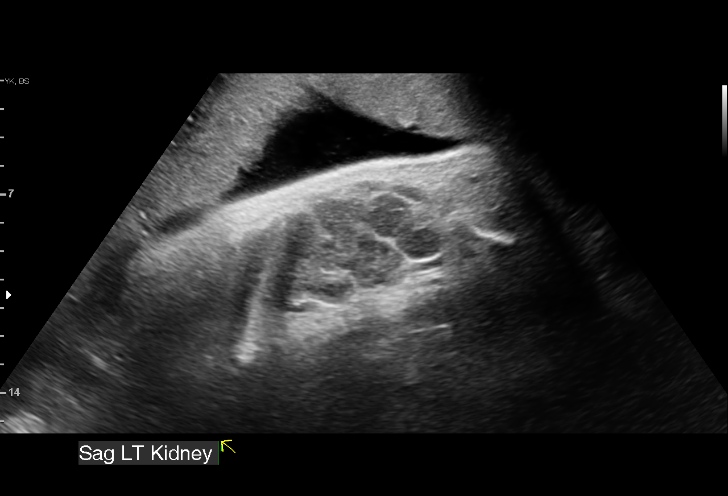
[im 26/34]
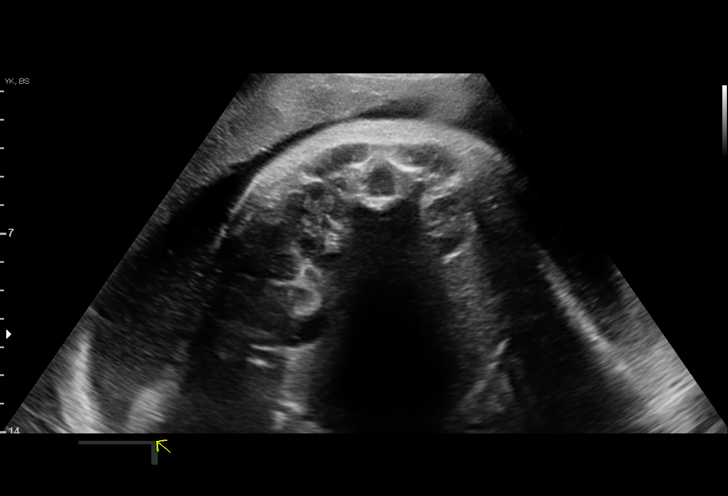
[im 29/34]
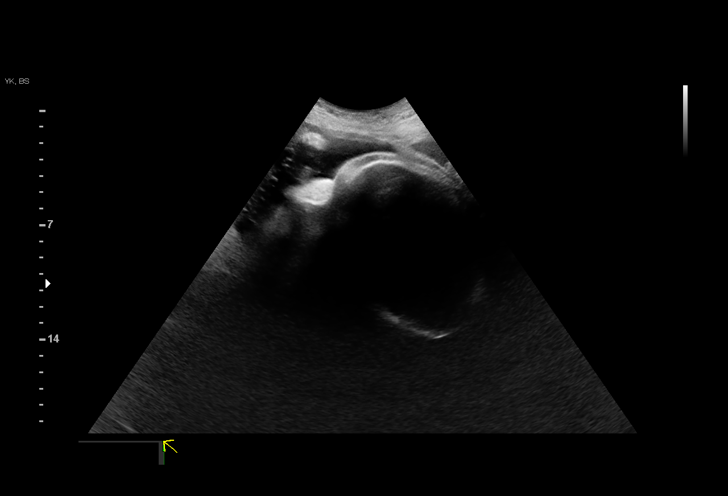
[im 31/34]
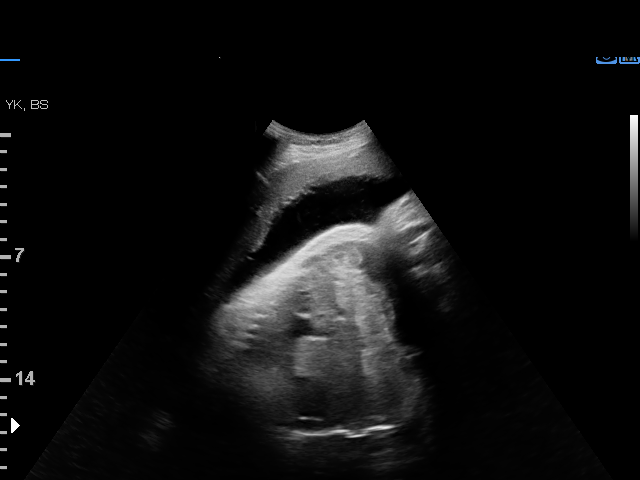
[im 34/34]
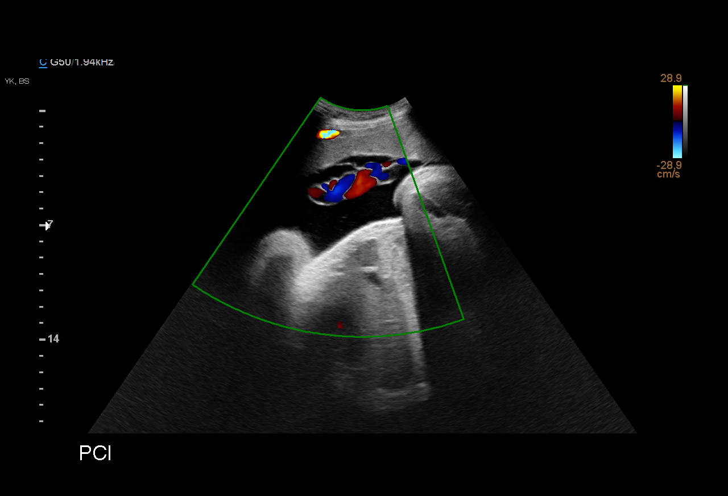

[15 of 28 positions shown; findings below may reference images not displayed]

[REDACTED]
                   [id number]

 ----------------------------------------------------------------------

 ----------------------------------------------------------------------
Indications

  Non-reactive NST
  38 weeks gestation of pregnancy
 ----------------------------------------------------------------------
Fetal Evaluation

 Num Of Fetuses:          1
 Fetal Heart Rate(bpm):   164
 Cardiac Activity:        Observed
 Presentation:            Cephalic
 Placenta:                Anterior
 P. Cord Insertion:       Visualized, central

 Amniotic Fluid
 AFI FV:      Polyhydramnios

 AFI Sum(cm)     %Tile       Largest Pocket(cm)
 31.39           > 97

 RUQ(cm)       RLQ(cm)       LUQ(cm)        LLQ(cm)

Biophysical Evaluation

 Amniotic F.V:   Polyhydramnios             F. Tone:         Observed
 F. Movement:    Observed                   Score:           [DATE]
 F. Breathing:   Not Observed
OB History

 Gravidity:    4         Term:   2        Prem:   0        SAB:   1
 TOP:          0       Ectopic:  0        Living: 2
Gestational Age

 LMP:           38w 0d        Date:  02/17/18                 EDD:   11/24/18
 Best:          38w 0d     Det. By:  LMP  (02/17/18)          EDD:   11/24/18
Anatomy

 Stomach:               Appears normal, left   Bladder:                Appears normal
                        sided
 Kidneys:               Appear normal
Cervix Uterus Adnexa

 Cervix
 Not adaquately visualized
Impression

 Biophysical profile [DATE]
Recommendations

 Follow up per provider.

## 2020-02-02 NOTE — Telephone Encounter (Signed)
Given age, I would recommend visit with labs after.

## 2020-02-02 NOTE — Telephone Encounter (Signed)
Left detailed message for pt informing of same and to call back if there are any questions.

## 2020-03-05 ENCOUNTER — Encounter: Payer: 59 | Admitting: Internal Medicine

## 2020-03-12 ENCOUNTER — Other Ambulatory Visit: Payer: Self-pay

## 2020-03-12 ENCOUNTER — Ambulatory Visit (INDEPENDENT_AMBULATORY_CARE_PROVIDER_SITE_OTHER): Payer: 59 | Admitting: Family

## 2020-03-12 VITALS — BP 124/68 | HR 92 | Temp 98.6°F | Ht 65.0 in | Wt 173.6 lb

## 2020-03-12 DIAGNOSIS — Z13220 Encounter for screening for lipoid disorders: Secondary | ICD-10-CM

## 2020-03-12 DIAGNOSIS — Z0000 Encounter for general adult medical examination without abnormal findings: Secondary | ICD-10-CM | POA: Diagnosis not present

## 2020-03-12 NOTE — Progress Notes (Signed)
Sherry Sheppard is a 34 y.o. female with the following history as recorded in EpicCare:  Patient Active Problem List   Diagnosis Date Noted  . Well woman exam 04/08/2019  . IUD contraception 12/18/2018  . Routine general medical examination at a health care facility 08/05/2015  . Allergic rhinitis 08/03/2014  . History of seizure 10/08/2012  . AVM (arteriovenous malformation) brain 10/08/2012    Current Outpatient Medications  Medication Sig Dispense Refill  . Docusate Sodium (COLACE PO) Take by mouth 2 (two) times daily.    Marland Kitchen ketoconazole (NIZORAL) 2 % shampoo SMARTSIG:5 Milliliter(s) Topical 2-3 Times Weekly    . levonorgestrel (MIRENA) 20 MCG/24HR IUD 1 each by Intrauterine route once.     No current facility-administered medications for this visit.    Allergies: Banana, Other, and Procardia [nifedipine]  Past Medical History:  Diagnosis Date  . Allergic rhinitis   . AVM (arteriovenous malformation) brain   . Migraines   . Seizures ([place])    last event 08/24/12  . Vaginal Pap smear, abnormal     Past Surgical History:  Procedure Laterality Date  . BRAIN AVM REPAIR  12/2012   gamma radio knife @ [place]  2009  . CESAREAN SECTION N/A 08/24/2012   Procedure: CESAREAN SECTION;  Surgeon: Frederico Hamman, MD;  Location: [facility];  Service: Obstetrics;  Laterality: N/A;  . CESAREAN SECTION N/A 11/10/2018   Procedure: CESAREAN SECTION;  Surgeon: Chancy Milroy, MD;  Location: [facility];  Service: Obstetrics;  Laterality: N/A;  . DILATION AND EVACUATION  09/20/2011   Procedure: DILATATION AND EVACUATION;  Surgeon: Shelly Bombard, MD;  Location: [facility];  Service: Gynecology;  Laterality: N/A;  . TOOTH EXTRACTION  2012    Family History  Problem Relation Age of Onset  . Hyperlipidemia Mother   . Diabetes Mother   . Hypertension Father   . Lung cancer Maternal Grandmother 64  . Hypertension Maternal Aunt   . Diabetes Cousin     Social History    Tobacco Use  . Smoking status: Never Smoker  . Smokeless tobacco: Never Used  Substance Use Topics  . Alcohol use: No    Alcohol/week: 0.0 standard drinks    Subjective:  Presents for yearly CPE; does have GYN- up to date on pap smear;  Post-partum 15 month; +breastfeeding- planning to stop soon;  Up to date on eye exam; overdue to see dentist;   Review of Systems  Constitutional: Negative.   HENT: Negative.   Eyes: Negative.   Respiratory: Negative.   Cardiovascular: Negative.   Gastrointestinal: Negative.   Genitourinary: Negative.   Musculoskeletal: Negative.   Skin: Negative.   Neurological: Negative.   Endo/Heme/Allergies: Negative.   Psychiatric/Behavioral: Negative.      Objective:  Vitals:   03/12/20 1314  BP: 124/68  Pulse: 92  Temp: 98.6 F (37 C)  TempSrc: Oral  SpO2: 98%  Weight: 173 lb 9.6 oz (78.7 kg)  Height: 5' 5"  (1.651 m)    General: Well developed, well nourished, in no acute distress  Skin : Warm and dry.  Head: Normocephalic and atraumatic  Eyes: Sclera and conjunctiva clear; pupils round and reactive to light; extraocular movements intact  Ears: External normal; canals clear; tympanic membranes normal  Oropharynx: Pink, supple. No suspicious lesions  Neck: Supple without thyromegaly, adenopathy  Lungs: Respirations unlabored; clear to auscultation bilaterally without wheeze, rales, rhonchi  CVS exam: normal rate and regular rhythm.  Abdomen: Soft;  nontender; nondistended; normoactive bowel sounds; no masses or hepatosplenomegaly  Musculoskeletal: No deformities; no active joint inflammation  Extremities: No edema, cyanosis, clubbing  Vessels: Symmetric bilaterally  Neurologic: Alert and oriented; speech intact; face symmetrical; moves all extremities well; CNII-XII intact without focal deficit  Assessment:  1. PE (physical exam), annual   2. Lipid screening     Plan:  Age appropriate preventive healthcare needs addressed; encouraged  regular eye doctor and dental exams; encouraged regular exercise and weight loss; will update labs and refills as needed today; follow-up in 6 months- goal weight is around 150 pounds;  This visit occurred during the SARS-CoV-2 public health emergency.  Safety protocols were in place, including screening questions prior to the visit, additional usage of staff PPE, and extensive cleaning of exam room while observing appropriate contact time as indicated for disinfecting solutions.      No follow-ups on file.  Orders Placed This Encounter  Procedures  . CBC with Differential/Platelet    Standing Status:   Future    Standing Expiration Date:   03/12/2021  . Comp Met (CMET)    Standing Status:   Future    Standing Expiration Date:   03/12/2021  . Lipid panel    Standing Status:   Future    Standing Expiration Date:   03/12/2021  . TSH    Standing Status:   Future    Standing Expiration Date:   03/12/2021    Requested Prescriptions    No prescriptions requested or ordered in this encounter

## 2020-03-13 LAB — COMPREHENSIVE METABOLIC PANEL
AG Ratio: 1.3 (calc) (ref 1.0–2.5)
ALT: 5 U/L — ABNORMAL LOW (ref 6–29)
AST: 11 U/L (ref 10–30)
Albumin: 4.3 g/dL (ref 3.6–5.1)
Alkaline phosphatase (APISO): 110 U/L (ref 31–125)
BUN: 11 mg/dL (ref 7–25)
CO2: 27 mmol/L (ref 20–32)
Calcium: 9.5 mg/dL (ref 8.6–10.2)
Chloride: 103 mmol/L (ref 98–110)
Creat: 0.65 mg/dL (ref 0.50–1.10)
Globulin: 3.2 g/dL (calc) (ref 1.9–3.7)
Glucose, Bld: 79 mg/dL (ref 65–99)
Potassium: 4.2 mmol/L (ref 3.5–5.3)
Sodium: 139 mmol/L (ref 135–146)
Total Bilirubin: 0.3 mg/dL (ref 0.2–1.2)
Total Protein: 7.5 g/dL (ref 6.1–8.1)

## 2020-03-13 LAB — CBC WITH DIFFERENTIAL/PLATELET
Absolute Monocytes: 554 cells/uL (ref 200–950)
Basophils Absolute: 45 cells/uL (ref 0–200)
Basophils Relative: 0.4 %
Eosinophils Absolute: 237 cells/uL (ref 15–500)
Eosinophils Relative: 2.1 %
HCT: 38.6 % (ref 35.0–45.0)
Hemoglobin: 12 g/dL (ref 11.7–15.5)
Lymphs Abs: 3865 cells/uL (ref 850–3900)
MCH: 26.5 pg — ABNORMAL LOW (ref 27.0–33.0)
MCHC: 31.1 g/dL — ABNORMAL LOW (ref 32.0–36.0)
MCV: 85.4 fL (ref 80.0–100.0)
MPV: 9.8 fL (ref 7.5–12.5)
Monocytes Relative: 4.9 %
Neutro Abs: 6599 cells/uL (ref 1500–7800)
Neutrophils Relative %: 58.4 %
Platelets: 348 10*3/uL (ref 140–400)
RBC: 4.52 10*6/uL (ref 3.80–5.10)
RDW: 12.4 % (ref 11.0–15.0)
Total Lymphocyte: 34.2 %
WBC: 11.3 10*3/uL — ABNORMAL HIGH (ref 3.8–10.8)

## 2020-03-13 LAB — TSH: TSH: 0.53 mIU/L

## 2020-03-13 LAB — LIPID PANEL
Cholesterol: 139 mg/dL (ref <200)
HDL: 57 mg/dL (ref >=50)
LDL Cholesterol (Calc): 70 mg/dL (calc)
Non-HDL Cholesterol (Calc): 82 mg/dL (calc) (ref <130)
Total CHOL/HDL Ratio: 2.4 (calc) (ref <5.0)
Triglycerides: 50 mg/dL (ref <150)

## 2020-05-07 ENCOUNTER — Ambulatory Visit (INDEPENDENT_AMBULATORY_CARE_PROVIDER_SITE_OTHER): Payer: 59

## 2020-05-07 ENCOUNTER — Other Ambulatory Visit: Payer: Self-pay

## 2020-05-07 DIAGNOSIS — Z23 Encounter for immunization: Secondary | ICD-10-CM

## 2020-07-27 ENCOUNTER — Other Ambulatory Visit: Payer: 59

## 2020-07-28 ENCOUNTER — Other Ambulatory Visit: Payer: 59

## 2020-08-23 DIAGNOSIS — H18603 Keratoconus, unspecified, bilateral: Secondary | ICD-10-CM | POA: Diagnosis not present

## 2020-08-23 DIAGNOSIS — H53461 Homonymous bilateral field defects, right side: Secondary | ICD-10-CM | POA: Diagnosis not present

## 2020-08-23 DIAGNOSIS — H53462 Homonymous bilateral field defects, left side: Secondary | ICD-10-CM | POA: Diagnosis not present

## 2020-08-23 DIAGNOSIS — H53469 Homonymous bilateral field defects, unspecified side: Secondary | ICD-10-CM | POA: Diagnosis not present

## 2020-09-09 ENCOUNTER — Other Ambulatory Visit: Payer: Self-pay

## 2020-09-09 ENCOUNTER — Ambulatory Visit: Payer: 59 | Admitting: Internal Medicine

## 2020-09-09 ENCOUNTER — Encounter: Payer: Self-pay | Admitting: Internal Medicine

## 2020-09-09 ENCOUNTER — Ambulatory Visit (INDEPENDENT_AMBULATORY_CARE_PROVIDER_SITE_OTHER): Payer: 59

## 2020-09-09 VITALS — BP 118/70 | HR 87 | Temp 99.5°F | Resp 18 | Ht 65.0 in | Wt 168.2 lb

## 2020-09-09 DIAGNOSIS — R002 Palpitations: Secondary | ICD-10-CM

## 2020-09-09 DIAGNOSIS — M25561 Pain in right knee: Secondary | ICD-10-CM

## 2020-09-09 LAB — COMPREHENSIVE METABOLIC PANEL
ALT: 9 U/L (ref 0–35)
AST: 13 U/L (ref 0–37)
Albumin: 4.3 g/dL (ref 3.5–5.2)
Alkaline Phosphatase: 85 U/L (ref 39–117)
BUN: 10 mg/dL (ref 6–23)
CO2: 30 mEq/L (ref 19–32)
Calcium: 9.1 mg/dL (ref 8.4–10.5)
Chloride: 104 mEq/L (ref 96–112)
Creatinine, Ser: 0.66 mg/dL (ref 0.40–1.20)
GFR: 115.06 mL/min (ref >60.00)
Glucose, Bld: 55 mg/dL — ABNORMAL LOW (ref 70–99)
Potassium: 3.4 mEq/L — ABNORMAL LOW (ref 3.5–5.1)
Sodium: 138 mEq/L (ref 135–145)
Total Bilirubin: 0.5 mg/dL (ref 0.2–1.2)
Total Protein: 7.8 g/dL (ref 6.0–8.3)

## 2020-09-09 LAB — TSH: TSH: 0.5 u[IU]/mL (ref 0.35–4.50)

## 2020-09-09 LAB — VITAMIN B12: Vitamin B-12: 379 pg/mL (ref 211–911)

## 2020-09-09 LAB — FERRITIN: Ferritin: 75.2 ng/mL (ref 10.0–291.0)

## 2020-09-09 LAB — VITAMIN D 25 HYDROXY (VIT D DEFICIENCY, FRACTURES): VITD: 12.96 ng/mL — ABNORMAL LOW (ref 30.00–100.00)

## 2020-09-09 LAB — CBC
HCT: 36.8 % (ref 36.0–46.0)
Hemoglobin: 11.9 g/dL — ABNORMAL LOW (ref 12.0–15.0)
MCHC: 32.4 g/dL (ref 30.0–36.0)
MCV: 84.4 fl (ref 78.0–100.0)
Platelets: 346 10*3/uL (ref 150.0–400.0)
RBC: 4.36 Mil/uL (ref 3.87–5.11)
RDW: 13.7 % (ref 11.5–15.5)
WBC: 9.4 10*3/uL (ref 4.0–10.5)

## 2020-09-09 NOTE — Patient Instructions (Signed)
We will check the x-ray today and the blood work.  Try turmeric for the knee.

## 2020-09-09 NOTE — Progress Notes (Unsigned)
   Subjective:   Patient ID: Sherry Sheppard, female    DOB: [birth year], 35 y.o.   MRN: [id number]  HPI The patient is a 35 YO female coming in for new palpitations (happening daily with fast HR and feeling palpitations, drinks 1 caffeine daily, denies extra chocolate, sleep is normal, denies chest pains or sweating, not with activity can be random, happening daily for the last several weeks) and right knee pain (worse in evening and with doing certain activities, she is working on losing weight and is down about 5 pounds, goal around 150, has tried otc which helps temporarily).   Review of Systems  Constitutional: Negative.   HENT: Negative.   Eyes: Negative.   Respiratory: Negative for cough, chest tightness and shortness of breath.   Cardiovascular: Positive for palpitations. Negative for chest pain and leg swelling.  Gastrointestinal: Negative for abdominal distention, abdominal pain, constipation, diarrhea, nausea and vomiting.  Musculoskeletal: Positive for arthralgias.  Skin: Negative.   Neurological: Negative.   Psychiatric/Behavioral: Negative.     Objective:  Physical Exam Constitutional:      Appearance: She is well-developed and well-nourished.  HENT:     Head: Normocephalic and atraumatic.  Eyes:     Extraocular Movements: EOM normal.  Cardiovascular:     Rate and Rhythm: Normal rate and regular rhythm.  Pulmonary:     Effort: Pulmonary effort is normal. No respiratory distress.     Breath sounds: Normal breath sounds. No wheezing or rales.  Abdominal:     General: Bowel sounds are normal. There is no distension.     Palpations: Abdomen is soft.     Tenderness: There is no abdominal tenderness. There is no rebound.  Musculoskeletal:        General: No edema.     Cervical back: Normal range of motion.  Skin:    General: Skin is warm and dry.  Neurological:     Mental Status: She is alert and oriented to person, place, and time.     Coordination: Coordination  normal.  Psychiatric:        Mood and Affect: Mood and affect normal.     Vitals:   09/09/20 1310  BP: 118/70  Pulse: 87  Resp: 18  Temp: 99.5 F (37.5 C)  TempSrc: Oral  SpO2: 100%  Weight: 168 lb 3.2 oz (76.3 kg)  Height: 5\' 5"  (1.651 m)    This visit occurred during the SARS-CoV-2 public health emergency.  Safety protocols were in place, including screening questions prior to the visit, additional usage of staff PPE, and extensive cleaning of exam room while observing appropriate contact time as indicated for disinfecting solutions.   Assessment & Plan:  Visit time 25 minutes in face to face communication with patient and coordination of care, additional 10 minutes spent in record review, coordination or care, ordering tests, communicating/referring to other healthcare professionals, documenting in medical records all on the same day of the visit for total time 35 minutes spent on the visit.

## 2020-09-10 ENCOUNTER — Other Ambulatory Visit: Payer: Self-pay | Admitting: Internal Medicine

## 2020-09-10 ENCOUNTER — Ambulatory Visit (INDEPENDENT_AMBULATORY_CARE_PROVIDER_SITE_OTHER): Payer: 59

## 2020-09-10 ENCOUNTER — Encounter: Payer: Self-pay | Admitting: Internal Medicine

## 2020-09-10 DIAGNOSIS — R002 Palpitations: Secondary | ICD-10-CM | POA: Insufficient documentation

## 2020-09-10 DIAGNOSIS — M25561 Pain in right knee: Secondary | ICD-10-CM | POA: Insufficient documentation

## 2020-09-10 MED ORDER — VITAMIN D (ERGOCALCIFEROL) 1.25 MG (50000 UNIT) PO CAPS: 50000.0000 [IU] | ORAL_CAPSULE | ORAL | 0 refills | Status: DC

## 2020-09-10 MED FILL — VIT D2 1.25 MG (50,000 UNIT: 1.25 MG | 84 days supply | Qty: 12 | Fill #0

## 2020-09-10 NOTE — Assessment & Plan Note (Signed)
Checking labs including CBC, CMP, vitamin D, vitamin B12, TSH. She is still breastfeeding so could have nutrient loss there. If normal will order holter monitor.

## 2020-09-10 NOTE — Assessment & Plan Note (Signed)
Checking x-ray to check for arthritis changes. Advised continued weight loss and turmeric.

## 2020-09-14 DIAGNOSIS — R002 Palpitations: Secondary | ICD-10-CM | POA: Diagnosis not present

## 2020-09-25 DIAGNOSIS — R002 Palpitations: Secondary | ICD-10-CM | POA: Diagnosis not present

## 2020-09-29 ENCOUNTER — Encounter: Payer: Self-pay | Admitting: Internal Medicine

## 2020-09-30 ENCOUNTER — Other Ambulatory Visit (HOSPITAL_BASED_OUTPATIENT_CLINIC_OR_DEPARTMENT_OTHER): Payer: Self-pay

## 2020-10-01 ENCOUNTER — Other Ambulatory Visit (HOSPITAL_BASED_OUTPATIENT_CLINIC_OR_DEPARTMENT_OTHER): Payer: Self-pay

## 2020-10-22 ENCOUNTER — Telehealth: Payer: 59 | Admitting: Family

## 2020-10-22 DIAGNOSIS — Z20822 Contact with and (suspected) exposure to covid-19: Secondary | ICD-10-CM

## 2020-10-22 MED ORDER — BENZONATATE 100 MG PO CAPS
100.0000 mg | ORAL_CAPSULE | Freq: Three times a day (TID) | ORAL | 0 refills | Status: DC | PRN
  Filled 2020-10-22: qty 20, 7d supply, fill #0

## 2020-10-22 MED ORDER — FLUTICASONE PROPIONATE 50 MCG/ACT NA SUSP
2.0000 | Freq: Every day | NASAL | 6 refills | Status: AC
  Filled 2020-10-22: qty 16, 30d supply, fill #0

## 2020-10-22 NOTE — Progress Notes (Signed)
E-Visit for Corona Virus Screening  Your current symptoms could be consistent with the coronavirus.  Many health care providers can now test patients at their office but not all are.  [facility] has multiple testing sites. For information on our COVID testing locations and hours go to [email or web address]  We are enrolling you in our MyChart Home Monitoring for COVID19 . Daily you will receive a questionnaire within the MyChart website. Our COVID 19 response team will be monitoring your responses daily.  Testing Information: The COVID-19 Community Testing sites are testing BY APPOINTMENT ONLY.  You can schedule online at [email or web address]  If you do not have access to a smart phone or computer you may call [phone] for an appointment.   Additional testing sites in the Community:  For [facility] Testing sites in [place]  https://www.cvs.com/minuteclinic/covid-19-testing  For Pop-up testing sites in [place]  https://covid19.ncdhhs.gov/about-covid-19/testing/find-my-testing-place/pop-testing-sites  For [facility] https://[place].gov[place]/human-services/health-department/coronavirus-covid-19-info/covid-19-testing  For [place] testing in [place] and [place] https://www.[place].gov/[place]/human-services/health-department/coronavirus-covid-19-info/covid-19-testing  For [facility] testing in [place]   https://[facility].care/covidtesting  For  more information about community testing call [phone]   Please quarantine yourself while awaiting your test results. Please stay home for a minimum of 10 days from the first day of illness with improving symptoms and you have had 24 hours of no fever (without the use of Tylenol (Acetaminophen) Motrin (Ibuprofen) or any fever reducing medication).  Also - Do not get tested prior to returning to work because once you have had a positive test the test can stay positive for  more than a month in some cases.   You should wear a mask or cloth face covering over your nose and mouth if you must be around other people or animals, including pets (even at home). Try to stay at least 6 feet away from other people. This will protect the people around you.  Please continue good preventive care measures, including:  frequent hand-washing, avoid touching your face, cover coughs/sneezes, stay out of crowds and keep a 6 foot distance from others.  COVID-19 is a respiratory illness with symptoms that are similar to the flu. Symptoms are typically mild to moderate, but there have been cases of severe illness and death due to the virus.   The following symptoms may appear 2-14 days after exposure: Fever Cough Shortness of breath or difficulty breathing Chills Repeated shaking with chills Muscle pain Headache Sore throat New loss of taste or smell Fatigue Congestion or runny nose Nausea or vomiting Diarrhea  Go to the nearest hospital ED for assessment if fever/cough/breathlessness are severe or illness seems like a threat to life.  It is vitally important that if you feel that you have an infection such as this virus or any other virus that you stay home and away from places where you may spread it to others.  You should avoid contact with people age 65 and older.   You can use medication such as prescription cough medication called Tessalon Perles 100 mg. You may take 1-2 capsules every 8 hours as needed for cough and prescription for Fluticasone nasal spray 2 sprays in each nostril one time per day  You may also take acetaminophen (Tylenol) as needed for fever.  Reduce your risk of any infection by using the same precautions used for avoiding the common cold or flu:  Wash your hands often with soap and warm water for at least 20 seconds.  If soap and water are not readily available, use an alcohol-based   hand sanitizer with at least 60% alcohol.  If coughing or sneezing,  cover your mouth and nose by coughing or sneezing into the elbow areas of your shirt or coat, into a tissue or into your sleeve (not your hands). Avoid shaking hands with others and consider head nods or verbal greetings only. Avoid touching your eyes, nose, or mouth with unwashed hands.  Avoid close contact with people who are sick. Avoid places or events with large numbers of people in one location, like concerts or sporting events. Carefully consider travel plans you have or are making. If you are planning any travel outside or inside the [place], visit the CDC's Travelers' Health webpage for the latest health notices. If you have some symptoms but not all symptoms, continue to monitor at home and seek medical attention if your symptoms worsen. If you are having a medical emergency, call 911.  HOME CARE Only take medications as instructed by your medical team. Drink plenty of fluids and get plenty of rest. A steam or ultrasonic humidifier can help if you have congestion.   GET HELP RIGHT AWAY IF YOU HAVE EMERGENCY WARNING SIGNS** FOR COVID-19. If you or someone is showing any of these signs seek emergency medical care immediately. Call 911 or proceed to your closest emergency facility if: You develop worsening high fever. Trouble breathing Bluish lips or face Persistent pain or pressure in the chest New confusion Inability to wake or stay awake You cough up blood. Your symptoms become more severe  **This list is not all possible symptoms. Contact your medical provider for any symptoms that are sever or concerning to you.  MAKE SURE YOU  Understand these instructions. Will watch your condition. Will get help right away if you are not doing well or get worse.  Your e-visit answers were reviewed by a board certified advanced clinical practitioner to complete your personal care plan.  Depending on the condition, your plan could have included both over the counter or prescription  medications.  If there is a problem please reply once you have received a response from your provider.  Your safety is important to us.  If you have drug allergies check your prescription carefully.    You can use MyChart to ask questions about today's visit, request a non-urgent call back, or ask for a work or school excuse for 24 hours related to this e-Visit. If it has been greater than 24 hours you will need to follow up with your provider, or enter a new e-Visit to address those concerns. You will get an e-mail in the next two days asking about your experience.  I hope that your e-visit has been valuable and will speed your recovery. Thank you for using e-visits.   Approximately 5 minutes was spent documenting and reviewing patient's chart.  

## 2020-10-23 ENCOUNTER — Other Ambulatory Visit (HOSPITAL_COMMUNITY): Payer: Self-pay

## 2020-10-26 ENCOUNTER — Other Ambulatory Visit (HOSPITAL_COMMUNITY): Payer: Self-pay

## 2020-10-29 ENCOUNTER — Other Ambulatory Visit (HOSPITAL_COMMUNITY): Payer: Self-pay

## 2020-10-29 ENCOUNTER — Telehealth: Payer: 59 | Admitting: Physician Assistant

## 2020-10-29 DIAGNOSIS — J208 Acute bronchitis due to other specified organisms: Secondary | ICD-10-CM | POA: Diagnosis not present

## 2020-10-29 DIAGNOSIS — B9689 Other specified bacterial agents as the cause of diseases classified elsewhere: Secondary | ICD-10-CM

## 2020-10-29 MED ORDER — BENZONATATE 100 MG PO CAPS: 100.0000 mg | ORAL_CAPSULE | Freq: Three times a day (TID) | ORAL | 0 refills | Status: DC | PRN

## 2020-10-29 MED ORDER — PREDNISONE 10 MG (21) PO TBPK
ORAL_TABLET | ORAL | 0 refills | Status: DC
  Filled 2020-10-29: qty 21, 6d supply, fill #0

## 2020-10-29 MED ORDER — AZITHROMYCIN 250 MG PO TABS
ORAL_TABLET | ORAL | 0 refills | Status: DC
  Filled 2020-10-29: qty 6, 5d supply, fill #0

## 2020-10-29 NOTE — Progress Notes (Signed)
We are sorry that you are not feeling well.  Here is how we plan to help!  Based on your presentation I believe you most likely have A cough due to bacteria.  When patients have a fever and a productive cough with a change in color or increased sputum production, we are concerned about bacterial bronchitis.  If left untreated it can progress to pneumonia.  If your symptoms do not improve with your treatment plan it is important that you contact your provider.   I have prescribed Azithromyin 250 mg: two tablets now and then one tablet daily for 4 additonal days    In addition you may use A prescription cough medication called Tessalon Perles 100mg . You may take 1-2 capsules every 8 hours as needed for your cough.  Prednisone 10 mg daily for 6 days (see taper instructions below)  Directions for 6 day taper: Day 1: 2 tablets before breakfast, 1 after both lunch & dinner and 2 at bedtime Day 2: 1 tab before breakfast, 1 after both lunch & dinner and 2 at bedtime Day 3: 1 tab at each meal & 1 at bedtime Day 4: 1 tab at breakfast, 1 at lunch, 1 at bedtime Day 5: 1 tab at breakfast & 1 tab at bedtime Day 6: 1 tab at breakfast   From your responses in the eVisit questionnaire you describe inflammation in the upper respiratory tract which is causing a significant cough.  This is commonly called Bronchitis and has four common causes:    Allergies  Viral Infections  Acid Reflux  Bacterial Infection Allergies, viruses and acid reflux are treated by controlling symptoms or eliminating the cause. An example might be a cough caused by taking certain blood pressure medications. You stop the cough by changing the medication. Another example might be a cough caused by acid reflux. Controlling the reflux helps control the cough.  USE OF BRONCHODILATOR ("RESCUE") INHALERS: There is a risk from using your bronchodilator too frequently.  The risk is that over-reliance on a medication which only relaxes the  muscles surrounding the breathing tubes can reduce the effectiveness of medications prescribed to reduce swelling and congestion of the tubes themselves.  Although you feel brief relief from the bronchodilator inhaler, your asthma may actually be worsening with the tubes becoming more swollen and filled with mucus.  This can delay other crucial treatments, such as oral steroid medications. If you need to use a bronchodilator inhaler daily, several times per day, you should discuss this with your provider.  There are probably better treatments that could be used to keep your asthma under control.     HOME CARE . Only take medications as instructed by your medical team. . Complete the entire course of an antibiotic. . Drink plenty of fluids and get plenty of rest. . Avoid close contacts especially the very young and the elderly . Cover your mouth if you cough or cough into your sleeve. . Always remember to wash your hands . A steam or ultrasonic humidifier can help congestion.   GET HELP RIGHT AWAY IF: . You develop worsening fever. . You become short of breath . You cough up blood. . Your symptoms persist after you have completed your treatment plan MAKE SURE YOU   Understand these instructions.  Will watch your condition.  Will get help right away if you are not doing well or get worse.  Your e-visit answers were reviewed by a board certified advanced clinical practitioner to complete your  personal care plan.  Depending on the condition, your plan could have included both over the counter or prescription medications. If there is a problem please reply  once you have received a response from your provider. Your safety is important to [place].  If you have drug allergies check your prescription carefully.    You can use MyChart to ask questions about today's visit, request a non-urgent call back, or ask for a work or school excuse for 24 hours related to this e-Visit. If it has been greater than  24 hours you will need to follow up with your provider, or enter a new e-Visit to address those concerns. You will get an e-mail in the next two days asking about your experience.  I hope that your e-visit has been valuable and will speed your recovery. Thank you for using e-visits.  I provided 6 minutes of non face-to-face time during this encounter for chart review and documentation.

## 2020-12-16 ENCOUNTER — Encounter: Payer: Self-pay | Admitting: Obstetrics

## 2020-12-16 ENCOUNTER — Other Ambulatory Visit (HOSPITAL_COMMUNITY)
Admission: RE | Admit: 2020-12-16 | Discharge: 2020-12-16 | Disposition: A | Discharge status: Home / Self Care | Payer: 59 | Source: Ambulatory Visit | Attending: Obstetrics | Admitting: Obstetrics

## 2020-12-16 ENCOUNTER — Ambulatory Visit (INDEPENDENT_AMBULATORY_CARE_PROVIDER_SITE_OTHER): Payer: 59 | Admitting: Obstetrics

## 2020-12-16 ENCOUNTER — Other Ambulatory Visit (HOSPITAL_COMMUNITY): Payer: Self-pay

## 2020-12-16 VITALS — BP 127/78 | HR 113 | Ht 65.0 in | Wt 164.0 lb

## 2020-12-16 DIAGNOSIS — Z01419 Encounter for gynecological examination (general) (routine) without abnormal findings: Secondary | ICD-10-CM

## 2020-12-16 DIAGNOSIS — Z113 Encounter for screening for infections with a predominantly sexual mode of transmission: Secondary | ICD-10-CM

## 2020-12-16 DIAGNOSIS — N898 Other specified noninflammatory disorders of vagina: Secondary | ICD-10-CM

## 2020-12-16 DIAGNOSIS — N946 Dysmenorrhea, unspecified: Secondary | ICD-10-CM | POA: Diagnosis not present

## 2020-12-16 DIAGNOSIS — J301 Allergic rhinitis due to pollen: Secondary | ICD-10-CM | POA: Diagnosis not present

## 2020-12-16 DIAGNOSIS — Z30431 Encounter for routine checking of intrauterine contraceptive device: Secondary | ICD-10-CM

## 2020-12-16 MED ORDER — LORATADINE 10 MG PO TABS
10.0000 mg | ORAL_TABLET | Freq: Every day | ORAL | 11 refills | Status: DC
  Filled 2020-12-16: qty 30, 30d supply, fill #0

## 2020-12-16 MED ORDER — IBUPROFEN 800 MG PO TABS
800.0000 mg | ORAL_TABLET | Freq: Three times a day (TID) | ORAL | 5 refills | Status: DC | PRN
  Filled 2020-12-16: qty 30, 10d supply, fill #0

## 2020-12-16 NOTE — Progress Notes (Signed)
Gyn presents for AEX/PAP/STD screening.  Reports no problems today.

## 2020-12-16 NOTE — Progress Notes (Signed)
Subjective:        Sherry Sheppard is a 35 y.o. female here for a routine exam.  Current complaints: Vaginal discharge.    Personal health questionnaire:  Is patient [name], have a family history of breast and/or ovarian cancer: no Is there a family history of uterine cancer diagnosed at age < 45, gastrointestinal cancer, urinary tract cancer, family member who is a Personnel officer syndrome-associated carrier: no Is the patient overweight and hypertensive, family history of diabetes, personal history of gestational diabetes, preeclampsia or PCOS: no Is patient over 22, have PCOS,  family history of premature CHD under age 44, diabetes, smoke, have hypertension or peripheral artery disease:  no At any time, has a partner hit, kicked or otherwise hurt or frightened you?: no Over the past 2 weeks, have you felt down, depressed or hopeless?: no Over the past 2 weeks, have you felt little interest or pleasure in doing things?:no   Gynecologic History No LMP recorded. (Menstrual status: IUD). Contraception: IUD Last Pap: 2019. Results were: normal Last mammogram: n/a. Results were: n/a  Obstetric History OB History  Gravida Para Term Preterm AB Living  4 2 2   1 2   SAB IAB Ectopic Multiple Live Births  1       2    # Outcome Date GA Lbr Len/2nd Weight Sex Delivery Anes PTL Lv  4 Gravida           3 Term 08/24/12 [redacted]w[redacted]d  8 lb 0.8 oz (3.65 kg) M CS-Vac Spinal  LIV     Birth Comments: had seizure during last CS had an AVM completely resolved as of 6/19 had surgery 12/2012  2 SAB 2013             Birth Comments: System Generated. Please review and update pregnancy details.  1 Term 04/07/08    M CS-LTranv   LIV     Birth Comments: c/S because FTP and fetal distress    Past Medical History:  Diagnosis Date   Allergic rhinitis    AVM (arteriovenous malformation) brain    Migraines    Seizures (HCC)    last event 08/24/12   Vaginal Pap smear, abnormal     Past Surgical  History:  Procedure Laterality Date   BRAIN AVM REPAIR  12/2012   gamma radio knife @ [place]   CESAREAN SECTION  2009   CESAREAN SECTION N/A 08/24/2012   Procedure: CESAREAN SECTION;  Surgeon: 08/26/2012, MD;  Location: [facility];  Service: Obstetrics;  Laterality: N/A;   CESAREAN SECTION N/A 11/10/2018   Procedure: CESAREAN SECTION;  Surgeon: 01/10/2019, MD;  Location: [facility];  Service: Obstetrics;  Laterality: N/A;   DILATION AND EVACUATION  09/20/2011   Procedure: DILATATION AND EVACUATION;  Surgeon: 09/22/2011, MD;  Location: [facility];  Service: Gynecology;  Laterality: N/A;   TOOTH EXTRACTION  2012     Current Outpatient Medications:    azithromycin (ZITHROMAX) 250 MG tablet, Take 2 tablets BY MOUTH on day one, and one tablet daily thereafter until completed. (Patient not taking: Reported on 12/16/2020), Disp: 6 tablet, Rfl: 0   benzonatate (TESSALON PERLES) 100 MG capsule, Take 1 capsule (100 mg total) by mouth 3 (three) times daily as needed. (Patient not taking: Reported on 12/16/2020), Disp: 20 capsule, Rfl: 0   Docusate Sodium (COLACE PO), Take by mouth 2 (two) times daily. (Patient not taking: Reported on 12/16/2020), Disp: , Rfl:  fluticasone (FLONASE) 50 MCG/ACT nasal spray, Place 2 sprays into both nostrils daily., Disp: 16 g, Rfl: 6   ketoconazole (NIZORAL) 2 % shampoo, SMARTSIG:5 Milliliter(s) Topical 2-3 Times Weekly, Disp: , Rfl:    levonorgestrel (MIRENA) 20 MCG/24HR IUD, 1 each by Intrauterine route once., Disp: , Rfl:    predniSONE (STERAPRED UNI-PAK 21 TAB) 10 MG (21) TBPK tablet, 6 day taper; take as directed on package instructions (Patient not taking: Reported on 12/16/2020), Disp: 21 tablet, Rfl: 0   Vitamin D, Ergocalciferol, (DRISDOL) 1.25 MG (50000 UNIT) CAPS capsule, TAKE 1 CAPSULE BY MOUTH EVERY 7 DAYS, Disp: 12 capsule, Rfl: 0 Allergies  Allergen Reactions   Banana Shortness Of Breath   Other Nausea And Vomiting    Contrast dye   Procardia  [Nifedipine] Palpitations    Social History   Tobacco Use   Smoking status: Never   Smokeless tobacco: Never  Substance Use Topics   Alcohol use: No    Alcohol/week: 0.0 standard drinks    Family History  Problem Relation Age of Onset   Hyperlipidemia Mother    Diabetes Mother    Hypertension Father    Lung cancer Maternal Grandmother 106   Hypertension Maternal Aunt    Diabetes Cousin       Review of Systems  Constitutional: negative for fatigue and weight loss Respiratory: negative for cough and wheezing Cardiovascular: negative for chest pain, fatigue and palpitations Gastrointestinal: negative for abdominal pain and change in bowel habits Musculoskeletal:negative for myalgias Neurological: negative for gait problems and tremors Behavioral/Psych: negative for abusive relationship, depression Endocrine: negative for temperature intolerance    Genitourinary: positive for vaginal discharge.  negative for abnormal menstrual periods, genital lesions, hot flashes, sexual problems  Integument/breast: negative for breast lump, breast tenderness, nipple discharge and skin lesion(s)    Objective:       BP 127/78   Pulse (!) 113   Ht 5\' 5"  (1.651 m)   Wt 164 lb (74.4 kg)   BMI 27.29 kg/m  General:   Alert and no distress  Skin:   no rash or abnormalities  Lungs:   clear to auscultation bilaterally  Heart:   regular rate and rhythm, S1, S2 normal, no murmur, click, rub or gallop  Breasts:   normal without suspicious masses, skin or nipple changes or axillary nodes  Abdomen:  normal findings: no organomegaly, soft, non-tender and no hernia  Pelvis:  External genitalia: normal general appearance Urinary system: urethral meatus normal and bladder without fullness, nontender Vaginal: normal without tenderness, induration or masses Cervix: normal appearance Adnexa: normal bimanual exam Uterus: anteverted and non-tender, normal size   Lab Review Urine pregnancy test Labs  reviewed yes Radiologic studies reviewed no  I have spent a total of 20 minutes of face-to-face time, excluding clinical staff time, reviewing notes and preparing to see patient, ordering tests and/or medications, and counseling the patient.   Assessment:    1. Encounter for routine gynecological examination with Papanicolaou smear of cervix Rx: - Cytology - PAP( [place])  2. Vaginal discharge Rx: - Cervicovaginal ancillary only  3. Screening for STD (sexually transmitted disease) Rx: - RPR+HBsAg+HCVAb+...  4. Dysmenorrhea Rx: - ibuprofen (ADVIL) 800 MG tablet; Take 1 tablet (800 mg total) by mouth every 8 (eight) hours as needed.  Dispense: 30 tablet; Refill: 5  5. Encounter for routine checking of intrauterine contraceptive device (IUD) - pleased with IUD  6. Seasonal allergic rhinitis due to pollen Rx: - loratadine (CLARITIN) 10 MG  tablet; Take 1 tablet (10 mg total) by mouth daily.  Dispense: 30 tablet; Refill: 11     Plan:    Education reviewed: calcium supplements, depression evaluation, low fat, low cholesterol diet, safe sex/STD prevention, self breast exams, and weight bearing exercise. Contraception: IUD. Follow up in: 1 year.    Orders Placed This Encounter  Procedures   RPR+HBsAg+HCVAb+...     Brock Bad, MD 12/16/2020 8:41 AM

## 2020-12-17 LAB — CERVICOVAGINAL ANCILLARY ONLY
Bacterial Vaginitis (gardnerella): NEGATIVE
Candida Glabrata: NEGATIVE
Candida Vaginitis: NEGATIVE
Chlamydia: NEGATIVE
Comment: NEGATIVE
Comment: NEGATIVE
Comment: NEGATIVE
Comment: NEGATIVE
Comment: NEGATIVE
Comment: NORMAL
Neisseria Gonorrhea: NEGATIVE
Trichomonas: NEGATIVE

## 2020-12-17 LAB — CYTOLOGY - PAP
Comment: NEGATIVE
Diagnosis: NEGATIVE
High risk HPV: NEGATIVE

## 2020-12-17 LAB — RPR+HBSAG+HCVAB+...
HIV Screen 4th Generation wRfx: NONREACTIVE
Hep C Virus Ab: 0.1 s/co ratio (ref 0.0–0.9)
Hepatitis B Surface Ag: NEGATIVE
RPR Ser Ql: NONREACTIVE

## 2021-01-23 ENCOUNTER — Telehealth: Payer: 59 | Admitting: Orthopedic Surgery

## 2021-01-23 DIAGNOSIS — K649 Unspecified hemorrhoids: Secondary | ICD-10-CM

## 2021-01-23 MED ORDER — HYDROCORTISONE ACETATE 25 MG RE SUPP
25.0000 mg | Freq: Two times a day (BID) | RECTAL | 0 refills | Status: DC
  Filled 2021-01-23: qty 12, 6d supply, fill #0

## 2021-01-23 NOTE — Progress Notes (Signed)

## 2021-01-24 ENCOUNTER — Other Ambulatory Visit (HOSPITAL_COMMUNITY): Payer: Self-pay

## 2021-02-03 ENCOUNTER — Ambulatory Visit: Payer: 59 | Admitting: Internal Medicine

## 2021-03-21 ENCOUNTER — Other Ambulatory Visit (HOSPITAL_COMMUNITY): Payer: Self-pay

## 2021-03-21 ENCOUNTER — Telehealth: Payer: 59 | Admitting: Physician Assistant

## 2021-03-21 DIAGNOSIS — R3989 Other symptoms and signs involving the genitourinary system: Secondary | ICD-10-CM | POA: Diagnosis not present

## 2021-03-21 MED ORDER — CEPHALEXIN 500 MG PO CAPS
500.0000 mg | ORAL_CAPSULE | Freq: Two times a day (BID) | ORAL | 0 refills | Status: DC
  Filled 2021-03-21 (×2): qty 14, 7d supply, fill #0

## 2021-03-21 NOTE — Progress Notes (Signed)

## 2021-04-08 ENCOUNTER — Encounter: Payer: Self-pay | Admitting: Physician Assistant

## 2021-04-08 ENCOUNTER — Telehealth: Payer: 59 | Admitting: Physician Assistant

## 2021-04-08 DIAGNOSIS — N390 Urinary tract infection, site not specified: Secondary | ICD-10-CM

## 2021-04-08 NOTE — Progress Notes (Signed)
Based on what you shared with me, I feel your condition warrants further evaluation and I recommend that you be seen in a face to face visit.   NOTE: There will be NO CHARGE for this eVisit   If you are having a true medical emergency please call 911.      For an urgent face to face visit, [place] has six urgent care centers for your convenience:    Ms. Manganelli,  Sorry you aren't feeling well! It appears that you may have had similar symptoms in the past 30 days and were treated with antibiotic. Recurrent UTIs warrant a face to face visit for further evaluation to include labs such as urine culture, this is to ensure you are properly treated. Please have a face to face visit for further workup.     [facility] at [facility] Directions [phone] [address] [place], [place] [address]    [facility] Health Urgent Care Center [facility]) Get Driving Directions [phone] [address] [place], [place] [address]  [facility] Health Urgent Care Center [facility]) Get Driving Directions [phone] [address] [place],  [place]  [address]  [facility] Health Urgent Care at [facility] Get Driving Directions [phone] [address] [place], [place] [address]   [facility] Health Urgent Care at [facility] Get Driving Directions  [phone] [address] [place], [place] [address]   [facility] Health Urgent Care at [facility] Directions [phone] [address], [address] [place], [place] [address]  Your MyChart E-visit questionnaire answers were reviewed by a board certified advanced clinical practitioner to complete your personal care plan based on your specific symptoms.  Thank you for using e-Visits.   I spent 5-10 minutes on review and completion of this note- [facility]

## 2021-06-13 NOTE — Progress Notes (Signed)
[facility] [address] [place] [address] Phone: [phone] Subjective:   INadine Counts, am serving as a scribe for Dr. Antoine Primas. This visit occurred during the SARS-CoV-2 public health emergency.  Safety protocols were in place, including screening questions prior to the visit, additional usage of staff PPE, and extensive cleaning of exam room while observing appropriate contact time as indicated for disinfecting solutions.   I'm seeing this patient by the request  of:  Myrlene Broker, MD  CC: Hand pain follow-up  FOY:[id number]  Leola ARCHANA ECKMAN is a 35 y.o. female coming in with complaint of R hand pain. Patient states couple of days ago when making a fist hurts. Location of pain is between knuckle of 2nd and 3rd digit. Swelling. Took tylenol, ibuprofen, ice, and heat. When K is low legs cramp, left leg cramps in calf. Constant pain. Compression socks help, but once removed leg starts to hurt.       Past Medical History:  Diagnosis Date   Allergic rhinitis    AVM (arteriovenous malformation) brain    Migraines    Seizures (HCC)    last event 08/24/12   Vaginal Pap smear, abnormal    Past Surgical History:  Procedure Laterality Date   BRAIN AVM REPAIR  12/2012   gamma radio knife @ [place]   CESAREAN SECTION  2009   CESAREAN SECTION N/A 08/24/2012   Procedure: CESAREAN SECTION;  Surgeon: Kathreen Cosier, MD;  Location: [facility];  Service: Obstetrics;  Laterality: N/A;   CESAREAN SECTION N/A 11/10/2018   Procedure: CESAREAN SECTION;  Surgeon: Hermina Staggers, MD;  Location: [facility];  Service: Obstetrics;  Laterality: N/A;   DILATION AND EVACUATION  09/20/2011   Procedure: DILATATION AND EVACUATION;  Surgeon: Brock Bad, MD;  Location: [facility];  Service: Gynecology;  Laterality: N/A;   TOOTH EXTRACTION  2012   Social History   Socioeconomic History   Marital status: Married    Spouse name: Not on file   Number  of children: 2   Years of education: Not on file   Highest education level: Not on file  Occupational History   Occupation: Engineer, civil (consulting) Tech/Secretary     Employer: [employer]  Tobacco Use   Smoking status: Never   Smokeless tobacco: Never  Vaping Use   Vaping Use: Never used  Substance and Sexual Activity   Alcohol use: No    Alcohol/week: 0.0 standard drinks   Drug use: No   Sexual activity: Yes    Partners: Male    Birth control/protection: I.U.D.  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   Single, lives with sons   Social Determinants of Health   Financial Resource Strain: Not on file  Food Insecurity: Not on file  Transportation Needs: Not on file  Physical Activity: Not on file  Stress: Not on file  Social Connections: Not on file   Allergies  Allergen Reactions   Banana Shortness Of Breath   Other Nausea And Vomiting    Contrast dye   Procardia [Nifedipine] Palpitations   Family History  Problem Relation Age of Onset   Hyperlipidemia Mother    Diabetes Mother    Hypertension Father    Lung cancer Maternal Grandmother 68   Hypertension Maternal Aunt    Diabetes Cousin     Current Outpatient  Medications (Endocrine & Metabolic):    levonorgestrel (MIRENA) 20 MCG/24HR IUD, 1 each by Intrauterine route once.   Current Outpatient Medications (Respiratory):    fluticasone (FLONASE) 50 MCG/ACT nasal spray, Place 2 sprays into both nostrils daily.   loratadine (CLARITIN) 10 MG tablet, Take 1 tablet (10 mg total) by mouth daily.  Current Outpatient Medications (Analgesics):    ibuprofen (ADVIL) 800 MG tablet, Take 1 tablet (800 mg total) by mouth every 8 (eight) hours as needed.   Current Outpatient Medications (Other):    cephALEXin (KEFLEX) 500 MG capsule, Take 1 capsule (500 mg total) by mouth 2 (two) times daily.   Docusate Sodium (COLACE PO), Take by mouth 2 (two) times  daily. (Patient not taking: Reported on 12/16/2020)   hydrocortisone (ANUSOL-HC) 25 MG suppository, Place 1 suppository (25 mg total) rectally 2 (two) times daily.   ketoconazole (NIZORAL) 2 % shampoo, SMARTSIG:5 Milliliter(s) Topical 2-3 Times Weekly   Vitamin D, Ergocalciferol, (DRISDOL) 1.25 MG (50000 UNIT) CAPS capsule, TAKE 1 CAPSULE BY MOUTH EVERY 7 DAYS   Reviewed prior external information including notes and imaging from  primary care provider As well as notes that were available from care everywhere and other healthcare systems.  Past medical history, social, surgical and family history all reviewed in electronic medical record.  No pertanent information unless stated regarding to the chief complaint.   Review of Systems:  No headache, visual changes, nausea, vomiting, diarrhea, constipation, dizziness, abdominal pain, skin rash, fevers, chills, night sweats, weight loss, swollen lymph nodes, body aches, joint swelling, chest pain, shortness of breath, mood changes. POSITIVE muscle aches  Objective  Blood pressure 116/80, pulse (!) 110, height 5\' 5"  (1.651 m), weight 165 lb (74.8 kg), SpO2 99 %, currently breastfeeding.   General: No apparent distress alert and oriented x3 mood and affect normal, dressed appropriately.  HEENT: Pupils equal, extraocular movements intact  Respiratory: Patient's speak in full sentences and does not appear short of breath  Cardiovascular: No lower extremity edema, non tender, no erythema  Gait normal with good balance and coordination.  MSK:   Hand exam does show the patient does have swelling between the second and third metacarpals.  Patient has full range of motion.  Minorly tender in the area.  Neurovascularly intact distally.  Patient does still have some mild paleness of the nails but seems to be symmetric to the contralateral side.  Pale with conjunctive a noted as well.   Limited muscular skeletal ultrasound was performed and interpreted by  , M  Limited ultrasound of patient's right hand shows the patient does have trace effusions noted of the third and second MCP joints.  No significant increase in Doppler flow in the area or any cortical irregularities noted.  No cyst formation noted. 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

## 2021-06-14 ENCOUNTER — Encounter: Payer: Self-pay | Admitting: Family Medicine

## 2021-06-14 ENCOUNTER — Ambulatory Visit (INDEPENDENT_AMBULATORY_CARE_PROVIDER_SITE_OTHER): Payer: 59 | Admitting: Family Medicine

## 2021-06-14 ENCOUNTER — Ambulatory Visit: Payer: Self-pay

## 2021-06-14 ENCOUNTER — Other Ambulatory Visit: Payer: Self-pay

## 2021-06-14 VITALS — BP 116/80 | HR 110 | Ht 65.0 in | Wt 165.0 lb

## 2021-06-14 DIAGNOSIS — M2550 Pain in unspecified joint: Secondary | ICD-10-CM | POA: Diagnosis not present

## 2021-06-14 DIAGNOSIS — E559 Vitamin D deficiency, unspecified: Secondary | ICD-10-CM | POA: Diagnosis not present

## 2021-06-14 DIAGNOSIS — S63650A Sprain of metacarpophalangeal joint of right index finger, initial encounter: Secondary | ICD-10-CM

## 2021-06-14 DIAGNOSIS — M79641 Pain in right hand: Secondary | ICD-10-CM

## 2021-06-14 DIAGNOSIS — S63619A Unspecified sprain of unspecified finger, initial encounter: Secondary | ICD-10-CM | POA: Insufficient documentation

## 2021-06-14 LAB — IBC PANEL
Iron: 36 ug/dL — ABNORMAL LOW (ref 42–145)
Saturation Ratios: 9.8 % — ABNORMAL LOW (ref 20.0–50.0)
TIBC: 368.2 ug/dL (ref 250.0–450.0)
Transferrin: 263 mg/dL (ref 212.0–360.0)

## 2021-06-14 LAB — CBC WITH DIFFERENTIAL/PLATELET
Basophils Absolute: 0 10*3/uL (ref 0.0–0.1)
Basophils Relative: 0.3 % (ref 0.0–3.0)
Eosinophils Absolute: 0.3 10*3/uL (ref 0.0–0.7)
Eosinophils Relative: 3.7 % (ref 0.0–5.0)
HCT: 38.7 % (ref 36.0–46.0)
Hemoglobin: 12.3 g/dL (ref 12.0–15.0)
Lymphocytes Relative: 29 % (ref 12.0–46.0)
Lymphs Abs: 2.1 10*3/uL (ref 0.7–4.0)
MCHC: 31.7 g/dL (ref 30.0–36.0)
MCV: 85.3 fl (ref 78.0–100.0)
Monocytes Absolute: 0.3 10*3/uL (ref 0.1–1.0)
Monocytes Relative: 4 % (ref 3.0–12.0)
Neutro Abs: 4.5 10*3/uL (ref 1.4–7.7)
Neutrophils Relative %: 63 % (ref 43.0–77.0)
Platelets: 352 10*3/uL (ref 150.0–400.0)
RBC: 4.54 Mil/uL (ref 3.87–5.11)
RDW: 13.5 % (ref 11.5–15.5)
WBC: 7.1 10*3/uL (ref 4.0–10.5)

## 2021-06-14 LAB — COMPREHENSIVE METABOLIC PANEL
ALT: 8 U/L (ref 0–35)
AST: 12 U/L (ref 0–37)
Albumin: 4.4 g/dL (ref 3.5–5.2)
Alkaline Phosphatase: 78 U/L (ref 39–117)
BUN: 10 mg/dL (ref 6–23)
CO2: 29 mEq/L (ref 19–32)
Calcium: 9.6 mg/dL (ref 8.4–10.5)
Chloride: 103 mEq/L (ref 96–112)
Creatinine, Ser: 0.68 mg/dL (ref 0.40–1.20)
GFR: 113.62 mL/min (ref >60.00)
Glucose, Bld: 73 mg/dL (ref 70–99)
Potassium: 4 mEq/L (ref 3.5–5.1)
Sodium: 139 mEq/L (ref 135–145)
Total Bilirubin: 0.5 mg/dL (ref 0.2–1.2)
Total Protein: 7.8 g/dL (ref 6.0–8.3)

## 2021-06-14 LAB — FERRITIN: Ferritin: 75.8 ng/mL (ref 10.0–291.0)

## 2021-06-14 LAB — VITAMIN D 25 HYDROXY (VIT D DEFICIENCY, FRACTURES): VITD: 16.58 ng/mL — ABNORMAL LOW (ref 30.00–100.00)

## 2021-06-14 LAB — SEDIMENTATION RATE: Sed Rate: 28 mm/hr — ABNORMAL HIGH (ref 0–20)

## 2021-06-14 LAB — URIC ACID: Uric Acid, Serum: 3 mg/dL (ref 2.4–7.0)

## 2021-06-14 NOTE — Assessment & Plan Note (Signed)
Patient is alert appears to be more of a sprain.  Noted.  Discussed icing regimen and home exercises.  Discussed potential buddy taping.  Patient has of worsening symptoms though I would like to see patient sooner.  Follow-up with me again in 6 to 8 weeks.

## 2021-06-14 NOTE — Assessment & Plan Note (Signed)
Patient has had an AV malformation of the brain and seizure disorder.  Discussed with patient about the vitamin D being low regular think repeating would be beneficial.  Patient does have cramping we will continue to monitor.  Discussed the topical anti-inflammatories compared to the oral anti-inflammatories for longer duration if patient is having more pain.  Follow-up with me again in 6 to 8 weeks

## 2021-06-14 NOTE — Patient Instructions (Addendum)
Labs Today Voltaren or Arnica gel Ice after active Buddy tape fingers for next 2 weeks See you again in 4-5 weeks or contact if things worsen

## 2021-06-15 ENCOUNTER — Other Ambulatory Visit: Payer: Self-pay

## 2021-06-15 ENCOUNTER — Other Ambulatory Visit (HOSPITAL_COMMUNITY): Payer: Self-pay

## 2021-06-15 LAB — ANA: Anti Nuclear Antibody (ANA): NEGATIVE

## 2021-06-15 MED ORDER — VITAMIN D (ERGOCALCIFEROL) 1.25 MG (50000 UNIT) PO CAPS
50000.0000 [IU] | ORAL_CAPSULE | ORAL | 0 refills | Status: DC
  Filled 2021-06-15: qty 12, 84d supply, fill #0

## 2021-07-12 ENCOUNTER — Ambulatory Visit: Payer: 59 | Admitting: Family Medicine

## 2021-08-19 ENCOUNTER — Telehealth: Payer: Self-pay

## 2021-08-19 DIAGNOSIS — Z0000 Encounter for general adult medical examination without abnormal findings: Secondary | ICD-10-CM

## 2021-08-19 NOTE — Telephone Encounter (Signed)
Pt would like to have labs done before her physical on 09/14/2021.

## 2021-08-22 NOTE — Telephone Encounter (Signed)
Pt is aware that lab orders have been placed.

## 2021-08-22 NOTE — Telephone Encounter (Signed)
She did just have labs in Dec so we can check cholesterol before if she wants and any other labs if needed after her visit.

## 2021-08-22 NOTE — Addendum Note (Signed)
Addended by: Hillard Danker A on: 08/22/2021 08:12 AM   Modules accepted: Orders

## 2021-09-07 ENCOUNTER — Other Ambulatory Visit (INDEPENDENT_AMBULATORY_CARE_PROVIDER_SITE_OTHER): Payer: 59

## 2021-09-07 DIAGNOSIS — Z0000 Encounter for general adult medical examination without abnormal findings: Secondary | ICD-10-CM

## 2021-09-07 LAB — LIPID PANEL
Cholesterol: 155 mg/dL (ref 0–200)
HDL: 52.3 mg/dL (ref >39.00)
LDL Cholesterol: 91 mg/dL (ref 0–99)
NonHDL: 102.42
Total CHOL/HDL Ratio: 3
Triglycerides: 59 mg/dL (ref 0.0–149.0)
VLDL: 11.8 mg/dL (ref 0.0–40.0)

## 2021-09-14 ENCOUNTER — Ambulatory Visit (INDEPENDENT_AMBULATORY_CARE_PROVIDER_SITE_OTHER): Payer: 59 | Admitting: Internal Medicine

## 2021-09-14 ENCOUNTER — Encounter: Payer: Self-pay | Admitting: Internal Medicine

## 2021-09-14 VITALS — BP 118/78 | HR 87 | Temp 98.7°F | Resp 18 | Ht 65.0 in | Wt 165.6 lb

## 2021-09-14 DIAGNOSIS — Z0000 Encounter for general adult medical examination without abnormal findings: Secondary | ICD-10-CM | POA: Diagnosis not present

## 2021-09-14 DIAGNOSIS — E559 Vitamin D deficiency, unspecified: Secondary | ICD-10-CM | POA: Diagnosis not present

## 2021-09-14 DIAGNOSIS — Z87898 Personal history of other specified conditions: Secondary | ICD-10-CM | POA: Diagnosis not present

## 2021-09-14 NOTE — Assessment & Plan Note (Signed)
Flu shot up to date. Covid-19 counseled. Tetanus up to date. Pap smear up to date. Counseled about sun safety and mole surveillance. Counseled about the dangers of distracted driving. Given 10 year screening recommendations.   

## 2021-09-14 NOTE — Assessment & Plan Note (Signed)
No seizures since AVM repair. Continue to monitor.  ?

## 2021-09-14 NOTE — Assessment & Plan Note (Signed)
Ordered vitamin D as she is almost finished with high dose 50000 units 3 months replacement.  ?

## 2021-09-14 NOTE — Progress Notes (Signed)
? ?  Subjective:  ? ?Patient ID: Sherry Sheppard, female    DOB: 30-Jul-1985, 36 y.o.   MRN: [id number] ? ?HPI ?The patient is here for physical. ? ?PMH, [facility], social history reviewed and updated ? ?Review of Systems  ?Constitutional: Negative.   ?HENT: Negative.    ?Eyes: Negative.   ?Respiratory:  Negative for cough, chest tightness and shortness of breath.   ?Cardiovascular:  Negative for chest pain, palpitations and leg swelling.  ?Gastrointestinal:  Negative for abdominal distention, abdominal pain, constipation, diarrhea, nausea and vomiting.  ?Musculoskeletal: Negative.   ?Skin: Negative.   ?Neurological: Negative.   ?Psychiatric/Behavioral: Negative.    ? ?Objective:  ?Physical Exam ?Constitutional:   ?   Appearance: She is well-developed.  ?HENT:  ?   Head: Normocephalic and atraumatic.  ?Cardiovascular:  ?   Rate and Rhythm: Normal rate and regular rhythm.  ?Pulmonary:  ?   Effort: Pulmonary effort is normal. No respiratory distress.  ?   Breath sounds: Normal breath sounds. No wheezing or rales.  ?Abdominal:  ?   General: Bowel sounds are normal. There is no distension.  ?   Palpations: Abdomen is soft.  ?   Tenderness: There is no abdominal tenderness. There is no rebound.  ?Musculoskeletal:  ?   Cervical back: Normal range of motion.  ?Skin: ?   General: Skin is warm and dry.  ?Neurological:  ?   Mental Status: She is alert and oriented to person, place, and time.  ?   Coordination: Coordination normal.  ? ? ?Vitals:  ? 09/14/21 0800  ?BP: 118/78  ?Pulse: 87  ?Resp: 18  ?Temp: 98.7 ?F (37.1 ?C)  ?TempSrc: Oral  ?SpO2: 100%  ?Weight: 165 lb 9.6 oz (75.1 kg)  ?Height: 5\' 5"  (1.651 m)  ? ? ?This visit occurred during the SARS-CoV-2 public health emergency.  Safety protocols were in place, including screening questions prior to the visit, additional usage of staff PPE, and extensive cleaning of exam room while observing appropriate contact time as indicated for disinfecting solutions.  ? ?Assessment & Plan:   ? ?

## 2021-10-18 DIAGNOSIS — H53461 Homonymous bilateral field defects, right side: Secondary | ICD-10-CM | POA: Diagnosis not present

## 2021-10-18 DIAGNOSIS — H18603 Keratoconus, unspecified, bilateral: Secondary | ICD-10-CM | POA: Diagnosis not present

## 2021-10-18 DIAGNOSIS — H53462 Homonymous bilateral field defects, left side: Secondary | ICD-10-CM | POA: Diagnosis not present

## 2021-10-18 DIAGNOSIS — H53469 Homonymous bilateral field defects, unspecified side: Secondary | ICD-10-CM | POA: Diagnosis not present

## 2021-11-24 ENCOUNTER — Other Ambulatory Visit (INDEPENDENT_AMBULATORY_CARE_PROVIDER_SITE_OTHER): Payer: 59

## 2021-11-24 DIAGNOSIS — E559 Vitamin D deficiency, unspecified: Secondary | ICD-10-CM | POA: Diagnosis not present

## 2021-11-24 LAB — VITAMIN D 25 HYDROXY (VIT D DEFICIENCY, FRACTURES): VITD: 20.55 ng/mL — ABNORMAL LOW (ref 30.00–100.00)

## 2021-12-20 ENCOUNTER — Ambulatory Visit (INDEPENDENT_AMBULATORY_CARE_PROVIDER_SITE_OTHER): Payer: 59 | Admitting: Obstetrics

## 2021-12-20 ENCOUNTER — Other Ambulatory Visit (HOSPITAL_COMMUNITY): Payer: Self-pay

## 2021-12-20 ENCOUNTER — Encounter: Payer: Self-pay | Admitting: Obstetrics

## 2021-12-20 ENCOUNTER — Other Ambulatory Visit (HOSPITAL_COMMUNITY)
Admission: RE | Admit: 2021-12-20 | Discharge: 2021-12-20 | Disposition: A | Discharge status: Home / Self Care | Payer: 59 | Source: Ambulatory Visit | Attending: Obstetrics | Admitting: Obstetrics

## 2021-12-20 VITALS — BP 113/73 | HR 81 | Ht 65.0 in | Wt 168.8 lb

## 2021-12-20 DIAGNOSIS — Z01419 Encounter for gynecological examination (general) (routine) without abnormal findings: Secondary | ICD-10-CM | POA: Diagnosis not present

## 2021-12-20 DIAGNOSIS — Z30431 Encounter for routine checking of intrauterine contraceptive device: Secondary | ICD-10-CM

## 2021-12-20 DIAGNOSIS — N898 Other specified noninflammatory disorders of vagina: Secondary | ICD-10-CM | POA: Insufficient documentation

## 2021-12-20 DIAGNOSIS — N946 Dysmenorrhea, unspecified: Secondary | ICD-10-CM | POA: Diagnosis not present

## 2021-12-20 DIAGNOSIS — Z113 Encounter for screening for infections with a predominantly sexual mode of transmission: Secondary | ICD-10-CM | POA: Diagnosis not present

## 2021-12-20 DIAGNOSIS — J301 Allergic rhinitis due to pollen: Secondary | ICD-10-CM | POA: Diagnosis not present

## 2021-12-20 MED ORDER — LORATADINE 10 MG PO TABS
10.0000 mg | ORAL_TABLET | Freq: Every day | ORAL | 11 refills | Status: AC
  Filled 2021-12-20: qty 100, 100d supply, fill #0

## 2021-12-20 MED ORDER — IBUPROFEN 800 MG PO TABS
800.0000 mg | ORAL_TABLET | Freq: Three times a day (TID) | ORAL | 5 refills | Status: DC | PRN
  Filled 2021-12-20: qty 30, 10d supply, fill #0

## 2021-12-20 NOTE — Progress Notes (Signed)
Patient presents for AEX. Patient denies having any concerns today. Desires STD testing.  Last Pap: 12/16/20 Normal

## 2021-12-20 NOTE — Progress Notes (Signed)
Subjective:        Sherry Sheppard is a 36 y.o. female here for a routine exam.  Current complaints: Vaginal discharge.    Personal health questionnaire:  Is patient [name], have a family history of breast and/or ovarian cancer: no Is there a family history of uterine cancer diagnosed at age < 79, gastrointestinal cancer, urinary tract cancer, family member who is a Personnel officer syndrome-associated carrier: no Is the patient overweight and hypertensive, family history of diabetes, personal history of gestational diabetes, preeclampsia or PCOS: no Is patient over 57, have PCOS,  family history of premature CHD under age 32, diabetes, smoke, have hypertension or peripheral artery disease:  no At any time, has a partner hit, kicked or otherwise hurt or frightened you?: no Over the past 2 weeks, have you felt down, depressed or hopeless?: no Over the past 2 weeks, have you felt little interest or pleasure in doing things?:no   Gynecologic History No LMP recorded. (Menstrual status: IUD). Contraception: IUD Last Pap: 2022. Results were: normal Last mammogram: n/a. Results were: n/a  Obstetric History OB History  Gravida Para Term Preterm AB Living  4 2 2   1 2   SAB IAB Ectopic Multiple Live Births  1       2    # Outcome Date GA Lbr Len/2nd Weight Sex Delivery Anes PTL Lv  4 Gravida           3 Term 08/24/12 [redacted]w[redacted]d  8 lb 0.8 oz (3.65 kg) M CS-Vac Spinal  LIV     Birth Comments: had seizure during last CS had an AVM completely resolved as of 6/19 had surgery 12/2012  2 SAB 2013             Birth Comments: System Generated. Please review and update pregnancy details.  1 Term 04/07/08    M CS-LTranv   LIV     Birth Comments: c/S because FTP and fetal distress    Past Medical History:  Diagnosis Date   Allergic rhinitis    AVM (arteriovenous malformation) brain    Migraines    Seizures (HCC)    last event 08/24/12   Vaginal Pap smear, abnormal     Past Surgical  History:  Procedure Laterality Date   BRAIN AVM REPAIR  12/2012   gamma radio knife @ [place]   CESAREAN SECTION  2009   CESAREAN SECTION N/A 08/24/2012   Procedure: CESAREAN SECTION;  Surgeon: 08/26/2012, MD;  Location: [facility];  Service: Obstetrics;  Laterality: N/A;   CESAREAN SECTION N/A 11/10/2018   Procedure: CESAREAN SECTION;  Surgeon: 01/10/2019, MD;  Location: [facility];  Service: Obstetrics;  Laterality: N/A;   DILATION AND EVACUATION  09/20/2011   Procedure: DILATATION AND EVACUATION;  Surgeon: 09/22/2011, MD;  Location: [facility];  Service: Gynecology;  Laterality: N/A;   TOOTH EXTRACTION  2012     Current Outpatient Medications:    fluticasone (FLONASE) 50 MCG/ACT nasal spray, Place 2 sprays into both nostrils daily., Disp: 16 g, Rfl: 6   ibuprofen (ADVIL) 800 MG tablet, Take 1 tablet (800 mg total) by mouth every 8 (eight) hours as needed., Disp: 30 tablet, Rfl: 5   levonorgestrel (MIRENA) 20 MCG/24HR IUD, 1 each by Intrauterine route once., Disp: , Rfl:    loratadine (CLARITIN) 10 MG tablet, Take 1 tablet (10 mg total) by mouth daily., Disp: 30 tablet, Rfl: 11 Allergies  Allergen Reactions   Banana  Shortness Of Breath   Other Nausea And Vomiting    Contrast dye   Procardia [Nifedipine] Palpitations    Social History   Tobacco Use   Smoking status: Never   Smokeless tobacco: Never  Substance Use Topics   Alcohol use: Yes    Comment: occ    Family History  Problem Relation Age of Onset   Hyperlipidemia Mother    Diabetes Mother    Hypertension Father    Lung cancer Maternal Grandmother 50   Hypertension Maternal Aunt    Diabetes Cousin       Review of Systems  Constitutional: negative for fatigue and weight loss Respiratory: negative for cough and wheezing Cardiovascular: negative for chest pain, fatigue and palpitations Gastrointestinal: negative for abdominal pain and change in bowel habits Musculoskeletal:negative for  myalgias Neurological: negative for gait problems and tremors Behavioral/Psych: negative for abusive relationship, depression Endocrine: negative for temperature intolerance    Genitourinary: positive for vaginal discharge.  negative for abnormal menstrual periods, genital lesions, hot flashes, sexual problems  Integument/breast: negative for breast lump, breast tenderness, nipple discharge and skin lesion(s)    Objective:       BP 113/73   Pulse 81   Ht 5\' 5"  (1.651 m)   Wt 168 lb 12.8 oz (76.6 kg)   BMI 28.09 kg/m  General:   Alert and no distress  Skin:   no rash or abnormalities  Lungs:   clear to auscultation bilaterally  Heart:   regular rate and rhythm, S1, S2 normal, no murmur, click, rub or gallop  Breasts:   normal without suspicious masses, skin or nipple changes or axillary nodes  Abdomen:  normal findings: no organomegaly, soft, non-tender and no hernia  Pelvis:  External genitalia: normal general appearance Urinary system: urethral meatus normal and bladder without fullness, nontender Vaginal: normal without tenderness, induration or masses Cervix: normal appearance Adnexa: normal bimanual exam Uterus: anteverted and non-tender, normal size   Lab Review Urine pregnancy test Labs reviewed yes Radiologic studies reviewed no  I have spent a total of 20 minutes of face-to-face and non-face-to-face time, excluding clinical staff time, reviewing notes and preparing to see patient, ordering tests and/or medications, and counseling the patient.    Assessment:    1. Encounter for routine gynecological examination with Papanicolaou smear of cervix Rx: - Cytology - PAP( [place])  2. Vaginal discharge Rx: - Cervicovaginal ancillary only( )  3. Screening for STD (sexually transmitted disease) Rx: - Hepatitis B surface antigen - Hepatitis C antibody - HIV Antibody (routine testing w rflx) - [facility]  4. Dysmenorrhea Rx: - ibuprofen (ADVIL) 800 MG  tablet; Take 1 tablet (800 mg total) by mouth every 8 (eight) hours as needed.  Dispense: 30 tablet; Refill: 5  5. Seasonal allergic rhinitis due to pollen Rx: - loratadine (CLARITIN) 10 MG tablet; Take 1 tablet (10 mg total) by mouth daily.  Dispense: 30 tablet; Refill: 11  6. IUD check up - pleased with IUD     Plan:    Education reviewed: calcium supplements, depression evaluation, low fat, low cholesterol diet, safe sex/STD prevention, self breast exams, and weight bearing exercise. Contraception: IUD. Follow up in: 1 year.    Orders Placed This Encounter  Procedures   Hepatitis B surface antigen   Hepatitis C antibody   HIV Antibody (routine testing w rflx)   RPR      , MD 12/20/2021 8:36 AM

## 2021-12-21 ENCOUNTER — Other Ambulatory Visit (HOSPITAL_COMMUNITY): Payer: Self-pay

## 2021-12-21 LAB — CERVICOVAGINAL ANCILLARY ONLY
Bacterial Vaginitis (gardnerella): NEGATIVE
Candida Glabrata: NEGATIVE
Candida Vaginitis: NEGATIVE
Chlamydia: NEGATIVE
Comment: NEGATIVE
Comment: NEGATIVE
Comment: NEGATIVE
Comment: NEGATIVE
Comment: NEGATIVE
Comment: NORMAL
Neisseria Gonorrhea: NEGATIVE
Trichomonas: NEGATIVE

## 2021-12-21 LAB — RPR: RPR Ser Ql: NONREACTIVE

## 2021-12-21 LAB — HIV ANTIBODY (ROUTINE TESTING W REFLEX): HIV Screen 4th Generation wRfx: NONREACTIVE

## 2021-12-21 LAB — HEPATITIS B SURFACE ANTIGEN: Hepatitis B Surface Ag: NEGATIVE

## 2021-12-21 LAB — HEPATITIS C ANTIBODY: Hep C Virus Ab: NONREACTIVE

## 2021-12-22 LAB — CYTOLOGY - PAP
Adequacy: ABSENT
Comment: NEGATIVE
Diagnosis: NEGATIVE
High risk HPV: NEGATIVE

## 2022-04-13 ENCOUNTER — Telehealth: Payer: 59 | Admitting: Physician Assistant

## 2022-04-13 DIAGNOSIS — J019 Acute sinusitis, unspecified: Secondary | ICD-10-CM

## 2022-04-13 DIAGNOSIS — B9689 Other specified bacterial agents as the cause of diseases classified elsewhere: Secondary | ICD-10-CM

## 2022-04-13 MED ORDER — AMOXICILLIN-POT CLAVULANATE 875-125 MG PO TABS
1.0000 | ORAL_TABLET | Freq: Two times a day (BID) | ORAL | 0 refills | Status: DC
  Filled 2022-04-13: qty 14, 7d supply, fill #0

## 2022-04-13 NOTE — Progress Notes (Signed)

## 2022-04-14 ENCOUNTER — Other Ambulatory Visit (HOSPITAL_COMMUNITY): Payer: Self-pay

## 2022-04-14 MED ORDER — DOXYCYCLINE HYCLATE 100 MG PO TABS
100.0000 mg | ORAL_TABLET | Freq: Two times a day (BID) | ORAL | 0 refills | Status: DC
  Filled 2022-04-14: qty 20, 10d supply, fill #0

## 2022-04-14 NOTE — Addendum Note (Signed)
Addended by: Mar Daring on: 04/14/2022 07:27 AM   Modules accepted: Orders

## 2022-08-04 ENCOUNTER — Ambulatory Visit (HOSPITAL_COMMUNITY): Payer: Commercial Managed Care - PPO

## 2022-08-04 ENCOUNTER — Encounter: Payer: Self-pay | Admitting: Family Medicine

## 2022-08-04 ENCOUNTER — Encounter (HOSPITAL_COMMUNITY): Payer: Self-pay

## 2022-08-04 ENCOUNTER — Ambulatory Visit (INDEPENDENT_AMBULATORY_CARE_PROVIDER_SITE_OTHER): Payer: Commercial Managed Care - PPO | Admitting: Family Medicine

## 2022-08-04 ENCOUNTER — Ambulatory Visit (INDEPENDENT_AMBULATORY_CARE_PROVIDER_SITE_OTHER): Payer: Commercial Managed Care - PPO

## 2022-08-04 ENCOUNTER — Ambulatory Visit (HOSPITAL_COMMUNITY)
Admission: EM | Admit: 2022-08-04 | Discharge: 2022-08-04 | Disposition: A | Discharge status: Home / Self Care | Payer: Commercial Managed Care - PPO | Attending: Physician Assistant | Admitting: Physician Assistant

## 2022-08-04 VITALS — BP 122/76 | HR 145 | Temp 100.7°F | Ht 65.0 in

## 2022-08-04 DIAGNOSIS — B349 Viral infection, unspecified: Secondary | ICD-10-CM

## 2022-08-04 DIAGNOSIS — R000 Tachycardia, unspecified: Secondary | ICD-10-CM

## 2022-08-04 DIAGNOSIS — R0789 Other chest pain: Secondary | ICD-10-CM

## 2022-08-04 DIAGNOSIS — J029 Acute pharyngitis, unspecified: Secondary | ICD-10-CM

## 2022-08-04 DIAGNOSIS — R110 Nausea: Secondary | ICD-10-CM | POA: Diagnosis not present

## 2022-08-04 DIAGNOSIS — E860 Dehydration: Secondary | ICD-10-CM

## 2022-08-04 DIAGNOSIS — Z1152 Encounter for screening for COVID-19: Secondary | ICD-10-CM | POA: Diagnosis not present

## 2022-08-04 DIAGNOSIS — R509 Fever, unspecified: Secondary | ICD-10-CM | POA: Diagnosis not present

## 2022-08-04 DIAGNOSIS — R52 Pain, unspecified: Secondary | ICD-10-CM | POA: Diagnosis not present

## 2022-08-04 LAB — COMPREHENSIVE METABOLIC PANEL
ALT: 13 U/L (ref 0–44)
AST: 18 U/L (ref 15–41)
Albumin: 4.1 g/dL (ref 3.5–5.0)
Alkaline Phosphatase: 79 U/L (ref 38–126)
Anion gap: 10 (ref 5–15)
BUN: 5 mg/dL — ABNORMAL LOW (ref 6–20)
CO2: 25 mmol/L (ref 22–32)
Calcium: 9.1 mg/dL (ref 8.9–10.3)
Chloride: 101 mmol/L (ref 98–111)
Creatinine, Ser: 0.73 mg/dL (ref 0.44–1.00)
GFR, Estimated: >60 mL/min (ref >60)
Glucose, Bld: 95 mg/dL (ref 70–99)
Potassium: 3.5 mmol/L (ref 3.5–5.1)
Sodium: 136 mmol/L (ref 135–145)
Total Bilirubin: 0.5 mg/dL (ref 0.3–1.2)
Total Protein: 8.1 g/dL (ref 6.5–8.1)

## 2022-08-04 LAB — T4, FREE: Free T4: 0.88 ng/dL (ref 0.61–1.12)

## 2022-08-04 LAB — D-DIMER, QUANTITATIVE: D-Dimer, Quant: 0.39 ug/mL-FEU (ref 0.00–0.50)

## 2022-08-04 LAB — CBC WITH DIFFERENTIAL/PLATELET
Abs Immature Granulocytes: 0.08 10*3/uL — ABNORMAL HIGH (ref 0.00–0.07)
Basophils Absolute: 0 10*3/uL (ref 0.0–0.1)
Basophils Relative: 0 %
Eosinophils Absolute: 0 10*3/uL (ref 0.0–0.5)
Eosinophils Relative: 0 %
HCT: 39.2 % (ref 36.0–46.0)
Hemoglobin: 12.8 g/dL (ref 12.0–15.0)
Immature Granulocytes: 1 %
Lymphocytes Relative: 6 %
Lymphs Abs: 0.9 10*3/uL (ref 0.7–4.0)
MCH: 27.5 pg (ref 26.0–34.0)
MCHC: 32.7 g/dL (ref 30.0–36.0)
MCV: 84.3 fL (ref 80.0–100.0)
Monocytes Absolute: 0.6 10*3/uL (ref 0.1–1.0)
Monocytes Relative: 4 %
Neutro Abs: 13.8 10*3/uL — ABNORMAL HIGH (ref 1.7–7.7)
Neutrophils Relative %: 89 %
Platelets: 348 10*3/uL (ref 150–400)
RBC: 4.65 MIL/uL (ref 3.87–5.11)
RDW: 12.9 % (ref 11.5–15.5)
WBC: 15.4 10*3/uL — ABNORMAL HIGH (ref 4.0–10.5)
nRBC: 0 % (ref 0.0–0.2)

## 2022-08-04 LAB — POCT INFLUENZA A/B
Influenza A, POC: NEGATIVE
Influenza B, POC: NEGATIVE

## 2022-08-04 LAB — POC INFLUENZA A AND B ANTIGEN (URGENT CARE ONLY)
Influenza A Ag: NEGATIVE
Influenza B Ag: NEGATIVE

## 2022-08-04 LAB — SARS CORONAVIRUS 2 (TAT 6-24 HRS): SARS Coronavirus 2: NEGATIVE

## 2022-08-04 LAB — TSH: TSH: 0.208 u[IU]/mL — ABNORMAL LOW (ref 0.350–4.500)

## 2022-08-04 LAB — POC COVID19 BINAXNOW: SARS Coronavirus 2 Ag: NEGATIVE

## 2022-08-04 MED ORDER — ACETAMINOPHEN 325 MG PO TABS
ORAL_TABLET | ORAL | Status: AC
  Filled 2022-08-04: qty 3

## 2022-08-04 MED ORDER — SODIUM CHLORIDE 0.9 % IV BOLUS
1000.0000 mL | Freq: Once | INTRAVENOUS | Status: AC
  Administered 2022-08-04: 1000 mL via INTRAVENOUS

## 2022-08-04 MED ORDER — ACETAMINOPHEN 325 MG PO TABS
975.0000 mg | ORAL_TABLET | Freq: Once | ORAL | Status: AC
  Administered 2022-08-04: 975 mg via ORAL

## 2022-08-04 NOTE — ED Triage Notes (Signed)
Patient reports that she began having nausea, generalized body aches, and nasal congestion yesterday. Patient states on her way to work she felt chest pressure. Patient works at a [employer] office and she received an EKG, had a negative Flu Covid test. Patient's temp at the her job wa 100.6. Patient did not receive Tylenol.  Patient states she has not taken any medications for her symptoms.

## 2022-08-04 NOTE — Patient Instructions (Signed)
Please go to [facility] for evaluation and you most likely need IV fluids.

## 2022-08-04 NOTE — Progress Notes (Signed)
Subjective:     Patient ID: Sherry Sheppard, female    DOB: [birth year], 37 y.o.   MRN: [id number]  Chief Complaint  Patient presents with   Generalized Body Aches    Strarted last night   Nausea   Sore Throat    Sore Throat    Patient is in today for acute onset of body aches, nausea, ST, low grade fever and tachycardia last night. She has chest pain that feels achy and does not radiate.  Reports feeling tired for several days.   No dizziness, abdominal pain, vomiting or diarrhea.   Health Maintenance Due  Topic Date Due   HPV VACCINES (2 - 3-dose series) 07/27/2011    Past Medical History:  Diagnosis Date   Allergic rhinitis    AVM (arteriovenous malformation) brain    Migraines    Seizures (HCC)    last event 08/24/12   Vaginal Pap smear, abnormal     Past Surgical History:  Procedure Laterality Date   BRAIN AVM REPAIR  12/2012   gamma radio knife @ [place]   CESAREAN SECTION  2009   CESAREAN SECTION N/A 08/24/2012   Procedure: CESAREAN SECTION;  Surgeon: Kathreen Cosier, MD;  Location: [facility];  Service: Obstetrics;  Laterality: N/A;   CESAREAN SECTION N/A 11/10/2018   Procedure: CESAREAN SECTION;  Surgeon: Hermina Staggers, MD;  Location[facility];  Service: Obstetrics;  Laterality: N/A;   DILATION AND EVACUATION  09/20/2011   Procedure: DILATATION AND EVACUATION;  Surgeon: Brock Bad, MD;  Location: [facility];  Service: Gynecology;  Laterality: N/A;   TOOTH EXTRACTION  2012    Family History  Problem Relation Age of Onset   Hyperlipidemia Mother    Diabetes Mother    Hypertension Father    Lung cancer Maternal Grandmother 4   Hypertension Maternal Aunt    Diabetes Cousin     Social History   Socioeconomic History   Marital status: Married    Spouse name: Not on file   Number of children: 2   Years of education: Not on file   Highest education level: Not on file  Occupational History   Occupation: Optometrist     Employer:  [employer]  Tobacco Use   Smoking status: Never   Smokeless tobacco: Never  Vaping Use   Vaping Use: Never used  Substance and Sexual Activity   Alcohol use: Yes    Comment: occ   Drug use: No   Sexual activity: Yes    Partners: Male    Birth control/protection: I.U.D.  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   Single, lives with sons   Social Determinants of Health   Financial Resource Strain: Low Risk  (05/01/2018)   Overall Financial Resource Strain (CARDIA)    Difficulty of Paying Living Expenses: Not hard at all  Food Insecurity: No Food Insecurity (05/01/2018)   Hunger Vital Sign    Worried About Running Out of Food in the Last Year: Never true    Ran Out of Food in the Last Year: Never true  Transportation Needs: No Transportation Needs (05/01/2018)   PRAPARE - Administrator, Civil Service (Medical): No    Lack of Transportation (Non-Medical): No  Physical Activity: Insufficiently Active (05/01/2018)   Exercise Vital Sign    Days of Exercise per Week: 1 day  Minutes of Exercise per Session: 30 min  Stress: No Stress Concern Present (05/01/2018)   [place]    Feeling of Stress : Not at all  Social Connections: Unknown (05/01/2018)   Social Connection and Isolation Panel [NHANES]    Frequency of Communication with Friends and Family: More than three times a week    Frequency of Social Gatherings with Friends and Family: Three times a week    Attends Religious Services: More than 4 times per year    Active Member of Clubs or Organizations: No    Attends Archivist Meetings: Never    Marital Status: Not on file  Intimate Partner Violence: Not At Risk (05/01/2018)   Humiliation, Afraid, Rape, and Kick questionnaire    Fear of Current or Ex-Partner: No    Emotionally Abused: No     Physically Abused: No    Sexually Abused: No    Outpatient Medications Prior to Visit  Medication Sig Dispense Refill   fluticasone (FLONASE) 50 MCG/ACT nasal spray Place 2 sprays into both nostrils daily. 16 g 6   ibuprofen (ADVIL) 800 MG tablet Take 1 tablet (800 mg total) by mouth every 8 (eight) hours as needed. 30 tablet 5   levonorgestrel (MIRENA) 20 MCG/24HR IUD 1 each by Intrauterine route once.     loratadine (CLARITIN) 10 MG tablet Take 1 tablet (10 mg total) by mouth daily. 30 tablet 11   doxycycline (VIBRA-TABS) 100 MG tablet Take 1 tablet (100 mg total) by mouth 2 (two) times daily. (Patient not taking: Reported on 08/04/2022) 20 tablet 0   No facility-administered medications prior to visit.    Allergies  Allergen Reactions   Banana Shortness Of Breath   Other Nausea And Vomiting    Contrast dye   Procardia [Nifedipine] Palpitations    ROS     Objective:    Physical Exam Constitutional:      General: She is not in acute distress.    Appearance: She is ill-appearing.  HENT:     Mouth/Throat:     Mouth: Mucous membranes are dry.  Cardiovascular:     Rate and Rhythm: Regular rhythm. Tachycardia present.  Pulmonary:     Effort: Pulmonary effort is normal.     Breath sounds: Normal breath sounds.  Skin:    General: Skin is warm and dry.  Neurological:     General: No focal deficit present.     Mental Status: She is alert and oriented to person, place, and time.  Psychiatric:        Mood and Affect: Mood normal.        Behavior: Behavior normal.     BP 122/76 (BP Location: Left Arm, Patient Position: Sitting, Cuff Size: Large)   Pulse (!) 145   Temp (!) 100.7 F (38.2 C) (Oral)   Ht 5\' 5"  (1.651 m)   SpO2 99%   BMI 28.09 kg/m  Wt Readings from Last 3 Encounters:  12/20/21 168 lb 12.8 oz (76.6 kg)  09/14/21 165 lb 9.6 oz (75.1 kg)  06/14/21 165 lb (74.8 kg)       Assessment & Plan:   Problem List Items Addressed This Visit   None Visit  Diagnoses     Dehydration    -  Primary   Body aches       Relevant Orders   POC COVID-19 (Completed)   POCT Influenza A/B (Completed)   Sore throat  Relevant Orders   POC COVID-19 (Completed)   POCT Influenza A/B (Completed)   Tachycardia       Relevant Orders   EKG 12-Lead   Fever, unspecified fever cause       Viral illness          Negative Covid and flu tests in office.  EKG shows NSR, rate 141 with nonspecific T wave abnormality.  Most likely tachycardic due to dehydration.  We discussed her going home with Zofran and aggressively rehydrating orally vs going to get IV fluids. She does not think she can drink fluids at this time.  She feels safe  and prefers to drive to the urgent care and her husband will meet her there. Discussed case with her PCP, Dr. Sharlet Salina and she agrees with plan of care.    I have discontinued Gaspar Garbe "Don"'s doxycycline. I am also having her maintain her levonorgestrel, fluticasone, ibuprofen, and loratadine.  No orders of the defined types were placed in this encounter.

## 2022-08-04 NOTE — ED Provider Notes (Signed)
[facility]    CSN: [id number] Arrival date & time: 08/04/22  3086      History   Chief Complaint Chief Complaint  Patient presents with   Nasal Congestion   Generalized Body Aches   Sore Throat   Nausea   Fatigue    HPI Sherry Sheppard is a 37 y.o. female.   Patient presents today with a 24-hour history of URI symptoms including nausea, body aches, congestion.  Reports that earlier today she started developing chest discomfort and was seen by her primary care.  Workup there including flu, COVID was negative.  She had EKG that showed sinus tachycardia.  She continues to have discomfort in her chest that is rated 6 on a 0-10 pain scale, localized to substernal chest, no aggravating relieving factors identified.  She has not tried any over-the-counter medication for symptom management.  Does report that she did have a mild fever earlier today but has not taken any medication for this.  She has several children at home that have been sick but denies additional known sick contacts.  She is not interested in repeat COVID/flu testing as this was already negative with her primary care.  She is requesting fluids at this was discussed that her primary care's office due to her tachycardia.  She denies any significant vomiting or diarrhea.  She denies history of VTE event or significant risk factors including active malignancy, exogenous hormones, recent COVID-19 infection, hospitalization, immobilization, recent surgical procedure.    Past Medical History:  Diagnosis Date   Allergic rhinitis    AVM (arteriovenous malformation) brain    Migraines    Seizures ([place])    last event 08/24/12   Vaginal Pap smear, abnormal     Patient Active Problem List   Diagnosis Date Noted   Vitamin D deficiency 06/14/2021   IUD contraception 12/18/2018   Routine general medical examination at a health care facility 08/05/2015   Allergic rhinitis 08/03/2014   History of seizure 10/08/2012    AVM (arteriovenous malformation) brain 10/08/2012    Past Surgical History:  Procedure Laterality Date   BRAIN AVM REPAIR  12/2012   gamma radio knife @ [place]  2009   CESAREAN SECTION N/A 08/24/2012   Procedure: CESAREAN SECTION;  Surgeon: Frederico Hamman, MD;  Location: [facility];  Service: Obstetrics;  Laterality: N/A;   CESAREAN SECTION N/A 11/10/2018   Procedure: CESAREAN SECTION;  Surgeon: Chancy Milroy, MD;  Location: [facility];  Service: Obstetrics;  Laterality: N/A;   DILATION AND EVACUATION  09/20/2011   Procedure: DILATATION AND EVACUATION;  Surgeon: Shelly Bombard, MD;  Location: [facility];  Service: Gynecology;  Laterality: N/A;   TOOTH EXTRACTION  2012    OB History     Gravida  4   Para  2   Term  2   Preterm      AB  1   Living  2      SAB  1   IAB      Ectopic      Multiple      Live Births  2            Home Medications    Prior to Admission medications   Medication Sig Start Date End Date Taking? Authorizing Provider  fluticasone (FLONASE) 50 MCG/ACT nasal spray Place 2 sprays into both nostrils daily. 10/22/20   Evelina Dun A, FNP  ibuprofen (ADVIL) 800 MG tablet Take  1 tablet (800 mg total) by mouth every 8 (eight) hours as needed. 12/20/21   Brock Bad, MD  levonorgestrel (MIRENA) 20 MCG/24HR IUD 1 each by Intrauterine route once.    [provider]  loratadine (CLARITIN) 10 MG tablet Take 1 tablet (10 mg total) by mouth daily. 12/20/21   Brock Bad, MD    Family History Family History  Problem Relation Age of Onset   Hyperlipidemia Mother    Diabetes Mother    Hypertension Father    Lung cancer Maternal Grandmother 31   Hypertension Maternal Aunt    Diabetes Cousin     Social History Social History   Tobacco Use   Smoking status: Never   Smokeless tobacco: Never  Vaping Use   Vaping Use: Never used  Substance Use Topics   Alcohol use: Yes    Comment: occ   Drug use: No      Allergies   Banana, Other, and Procardia [nifedipine]   Review of Systems Review of Systems  Constitutional:  Positive for activity change, fatigue and fever. Negative for appetite change.  HENT:  Positive for congestion and sore throat. Negative for sinus pressure and sneezing.   Respiratory:  Negative for cough and shortness of breath.   Cardiovascular:  Negative for chest pain.  Gastrointestinal:  Positive for nausea. Negative for abdominal pain, diarrhea and vomiting.  Neurological:  Negative for dizziness, light-headedness and headaches.     Physical Exam Triage Vital Signs ED Triage Vitals  Enc Vitals Group     BP 08/04/22 1000 110/68     Pulse Rate 08/04/22 1000 (!) 134     Resp 08/04/22 1000 16     Temp 08/04/22 1000 99.6 F (37.6 C)     Temp Source 08/04/22 1000 Oral     SpO2 08/04/22 1000 94 %     Weight --      Height --      Head Circumference --      Peak Flow --      Pain Score 08/04/22 1003 6     Pain Loc --      Pain Edu? --      Excl. in GC? --    No data found.  Updated Vital Signs BP 111/70 (BP Location: Right Arm)   Pulse (!) 113   Temp 99.9 F (37.7 C) (Oral)   Resp 14   SpO2 96%   Visual Acuity Right Eye Distance:   Left Eye Distance:   Bilateral Distance:    Right Eye Near:   Left Eye Near:    Bilateral Near:     Physical Exam Vitals reviewed.  Constitutional:      General: She is awake. She is not in acute distress.    Appearance: Normal appearance. She is well-developed. She is not ill-appearing.     Comments: Very pleasant female appears stated age in no acute distress  HENT:     Head: Normocephalic and atraumatic.     Right Ear: Tympanic membrane, ear canal and external ear normal. Tympanic membrane is not erythematous or bulging.     Left Ear: Tympanic membrane, ear canal and external ear normal. Tympanic membrane is not erythematous or bulging.     Mouth/Throat:     Pharynx: Uvula midline. No oropharyngeal exudate  or posterior oropharyngeal erythema.  Cardiovascular:     Rate and Rhythm: Regular rhythm. Tachycardia present.     Heart sounds: Normal heart sounds, S1 normal and S2  normal. No murmur heard. Pulmonary:     Effort: Pulmonary effort is normal.     Breath sounds: Normal breath sounds. No wheezing, rhonchi or rales.     Comments: Clear to auscultation bilaterally Psychiatric:        Behavior: Behavior is cooperative.      UC Treatments / Results  Labs (all labs ordered are listed, but only abnormal results are displayed) Labs Reviewed  CBC WITH DIFFERENTIAL/PLATELET - Abnormal; Notable for the following components:      Result Value   WBC 15.4 (*)    Neutro Abs 13.8 (*)    Abs Immature Granulocytes 0.08 (*)    All other components within normal limits  COMPREHENSIVE METABOLIC PANEL - Abnormal; Notable for the following components:   BUN 5 (*)    All other components within normal limits  TSH - Abnormal; Notable for the following components:   TSH 0.208 (*)    All other components within normal limits  SARS CORONAVIRUS 2 (TAT 6-24 HRS)  D-DIMER, QUANTITATIVE  T4, FREE  POC INFLUENZA A AND B ANTIGEN (URGENT CARE ONLY)    EKG   Radiology DG Chest 2 View  Result Date: 08/04/2022 CLINICAL DATA:  Chest discomfort.  Nausea and general body aches. EXAM: CHEST - 2 VIEW COMPARISON:  None Available. FINDINGS: Cardiac silhouette and mediastinal contours are within normal limits. The lungs are clear. No pleural effusion or pneumothorax. Mild multilevel disc space narrowing of the superior thoracic spine. IMPRESSION: No active cardiopulmonary disease. Electronically Signed   By: Yvonne Kendall M.D.   On: 08/04/2022 12:44    Procedures Procedures (including critical care time)  Medications Ordered in UC Medications  sodium chloride 0.9 % bolus 1,000 mL (0 mLs Intravenous Stopped 08/04/22 1212)  acetaminophen (TYLENOL) tablet 975 mg (975 mg Oral Given 08/04/22 1215)    Initial  Impression / Assessment and Plan / UC Course  I have reviewed the triage vital signs and the nursing notes.  Pertinent labs & imaging results that were available during my care of the patient were reviewed by me and considered in my medical decision making (see chart for details).     Patient was initially tachycardic and requested fluids as recommended by her primary care.  She was given 1 L NS with improvement of symptoms.  Chest x-ray was obtained which showed no acute cardiopulmonary disease.  On recheck of vitals her heart rate had improved but she became febrile.  She was given a dose of Tylenol which resolved fever but she continued to be mildly tachycardic.  Labs were obtained including D-dimer which was negative given her tachycardia and chest discomfort.  Patient initially declined repeat flu and COVID testing but when she became febrile was open to this.  Flu testing was negative in clinic and COVID test is pending.  CBC showed leukocytosis we discussed that this is likely related to acute infection.  Recommend that she follow-up with her primary care first thing next week to have this repeated.  Metabolic panel was normal.  TSH was slightly low.  Will add free T4 to lab work already drawn and make additional recommendations based on these findings.  We did discuss abnormal TSH levels and encouraged her to follow-up with her primary care first thing next week for reevaluation and additional testing.  Discussed that if at any point she has changing or worsening symptoms including high fever, persistent tachycardia/palpitations, nausea/vomiting interfere with oral intake, weakness, chest pain, shortness of breath she  needs to go to the emergency room immediately to which she expressed understanding.  Reports she was feeling much better at her time of discharge and will follow closely with her primary care.  Strict return precautions given.  Work excuse note provided.  Final Clinical Impressions(s)  / UC Diagnoses   Final diagnoses:  Viral illness  Chest discomfort  Tachycardia     Discharge Instructions      Make sure that you are pushing fluids regularly.  I am glad you are feeling better after the fluid.  Your lab work did show elevated white blood cells.  This is likely related to an infection.  I recommend that you follow-up with your primary care next week to have this repeated.  If your symptoms worsen anyway need to go to the emergency room including high fever not respond to medication, weakness, nausea/vomiting interfering with oral intake, chest pain, shortness of breath.     ED Prescriptions   None    PDMP not reviewed this encounter.   Jeani Hawking, PA-C 08/04/22 1351

## 2022-08-04 NOTE — Discharge Instructions (Signed)
Make sure that you are pushing fluids regularly.  I am glad you are feeling better after the fluid.  Your lab work did show elevated white blood cells.  This is likely related to an infection.  I recommend that you follow-up with your primary care next week to have this repeated.  If your symptoms worsen anyway need to go to the emergency room including high fever not respond to medication, weakness, nausea/vomiting interfering with oral intake, chest pain, shortness of breath.

## 2022-08-05 ENCOUNTER — Telehealth: Payer: Commercial Managed Care - PPO | Admitting: Physician Assistant

## 2022-08-05 ENCOUNTER — Encounter: Payer: Self-pay | Admitting: Physician Assistant

## 2022-08-05 DIAGNOSIS — J029 Acute pharyngitis, unspecified: Secondary | ICD-10-CM

## 2022-08-05 NOTE — Progress Notes (Signed)
E-Visit for Sore Throat  We are sorry that you are not feeling well.  Here is how we plan to help!  Your symptoms indicate a likely viral infection (Pharyngitis).   Pharyngitis is inflammation in the back of the throat which can cause a sore throat, scratchiness and sometimes difficulty swallowing.   Pharyngitis is typically caused by a respiratory virus and will just run its course.  Please keep in mind that your symptoms could last up to 10 days.  For throat pain, we recommend over the counter oral pain relief medications such as acetaminophen or aspirin, or anti-inflammatory medications such as ibuprofen or naproxen sodium.  Topical treatments such as oral throat lozenges or sprays may be used as needed.  Avoid close contact with loved ones, especially the very young and elderly.  Remember to wash your hands thoroughly throughout the day as this is the number one way to prevent the spread of infection and wipe down door knobs and counters with disinfectant.  After careful review of your answers, I would not recommend an antibiotic for your condition.  Antibiotics should not be used to treat conditions that we suspect are caused by viruses like the virus that causes the common cold or flu. However, some people can have Strep with atypical symptoms. You may need formal testing in clinic or office to confirm if your symptoms continue or worsen.  Providers prescribe antibiotics to treat infections caused by bacteria. Antibiotics are very powerful in treating bacterial infections when they are used properly.  To maintain their effectiveness, they should be used only when necessary.  Overuse of antibiotics has resulted in the development of super bugs that are resistant to treatment!    Home Care: Only take medications as instructed by your medical team. Do not drink alcohol while taking these medications. A steam or ultrasonic humidifier can help congestion.  You can place a towel over your head and  breathe in the steam from hot water coming from a faucet. Avoid close contacts especially the very young and the elderly. Cover your mouth when you cough or sneeze. Always remember to wash your hands.  Get Help Right Away If: You develop worsening fever or throat pain. You develop a severe head ache or visual changes. Your symptoms persist after you have completed your treatment plan.  Make sure you Understand these instructions. Will watch your condition. Will get help right away if you are not doing well or get worse.   Thank you for choosing an e-visit.  Your e-visit answers were reviewed by a board certified advanced clinical practitioner to complete your personal care plan. Depending upon the condition, your plan could have included both over the counter or prescription medications.  Please review your pharmacy choice. Make sure the pharmacy is open so you can pick up prescription now. If there is a problem, you may contact your provider through MyChart messaging and have the prescription routed to another pharmacy.  Your safety is important to us. If you have drug allergies check your prescription carefully.   For the next 24 hours you can use MyChart to ask questions about today's visit, request a non-urgent call back, or ask for a work or school excuse. You will get an email in the next two days asking about your experience. I hope that your e-visit has been valuable and will speed your recovery.  I have spent 5 minutes in review of e-visit questionnaire, review and updating patient chart, medical decision making and response   to patient.   Kailin Leu S Mayers, PA-C     

## 2022-08-06 ENCOUNTER — Telehealth: Payer: Commercial Managed Care - PPO | Admitting: Family Medicine

## 2022-08-06 DIAGNOSIS — J029 Acute pharyngitis, unspecified: Secondary | ICD-10-CM

## 2022-08-06 MED ORDER — CEFDINIR 300 MG PO CAPS: 300.0000 mg | ORAL_CAPSULE | Freq: Two times a day (BID) | ORAL | 0 refills | Status: DC

## 2022-08-06 NOTE — Progress Notes (Signed)
Virtual Visit Consent   [facility], you are scheduled for a virtual visit with a [facility] provider today. Just as with appointments in the office, your consent must be obtained to participate. Your consent will be active for this visit and any virtual visit you may have with one of our providers in the next 365 days. If you have a [facility] account, a copy of this consent can be sent to you electronically.  As this is a virtual visit, video technology does not allow for your provider to perform a traditional examination. This may limit your provider's ability to fully assess your condition. If your provider identifies any concerns that need to be evaluated in person or the need to arrange testing (such as labs, EKG, etc.), we will make arrangements to do so. Although advances in technology are sophisticated, we cannot ensure that it will always work on either your end or our end. If the connection with a video visit is poor, the visit may have to be switched to a telephone visit. With either a video or telephone visit, we are not always able to ensure that we have a secure connection.  By engaging in this virtual visit, you consent to the provision of healthcare and authorize for your insurance to be billed (if applicable) for the services provided during this visit. Depending on your insurance coverage, you may receive a charge related to this service.  I need to obtain your verbal consent now. Are you willing to proceed with your visit today? [facility] has provided verbal consent on 08/06/2022 for a virtual visit (video or telephone). Sherry Nims, FNP  Date: 08/06/2022 9:35 AM  Virtual Visit via Video Note   I, Sherry Sheppard, connected with  Sherry Sheppard  ([id number], 11-29-1985) on 08/06/22 at  9:45 AM EST by a video-enabled telemedicine application and verified that I am speaking with the correct person using two identifiers.  Location: Patient: Virtual Visit Location  Patient: Home Provider: Virtual Visit Location Provider: Home Office   I discussed the limitations of evaluation and management by telemedicine and the availability of in person appointments. The patient expressed understanding and agreed to proceed.    History of Present Illness: Sherry Sheppard is a 37 y.o. who identifies as a female who was assigned female at birth, and is being seen today for sore throat with ear pain worsening. She was seen at [facility] Friday for [facility] for dehydration. Neg flu and covid test. No strep test done. She was seen through [facility] yesterday. She is concerned bc sore throat is worsening making her feel it may be strep. Swallowing is very painful and she is aching. She has white spots on throat. 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    Allergies:  Allergies  Allergen Reactions   Banana Shortness Of Breath   Other Nausea And Vomiting    Contrast dye   Procardia [Nifedipine] Palpitations   Medications:  Current Outpatient Medications:    cefdinir (OMNICEF) 300 MG capsule, Take 1 capsule (300 mg total) by mouth 2 (two) times daily for 10 days., Disp: 20 capsule, Rfl: 0   fluticasone (FLONASE) 50 MCG/ACT nasal spray, Place 2 sprays  into both nostrils daily., Disp: 16 g, Rfl: 6   ibuprofen (ADVIL) 800 MG tablet, Take 1 tablet (800 mg total) by mouth every 8 (eight) hours as needed., Disp: 30 tablet, Rfl: 5   levonorgestrel (MIRENA) 20 MCG/24HR IUD, 1 each by Intrauterine route once., Disp: , Rfl:    loratadine (CLARITIN) 10 MG tablet, Take 1 tablet (10 mg total) by mouth daily., Disp: 30 tablet, Rfl: 11  Observations/Objective: Patient is well-developed, well-nourished in no  acute distress.  Resting comfortably  at home.  Head is normocephalic, atraumatic.  No labored breathing.  Speech is clear and coherent with logical content.  Patient is alert and oriented at baseline.    Assessment and Plan: 1. Pharyngitis, unspecified etiology  Increase fluids warm salt water gargles, urgent care again if sx worsen.   Follow Up Instructions: I discussed the assessment and treatment plan with the patient. The patient was provided an opportunity to ask questions and all were answered. The patient agreed with the plan and demonstrated an understanding of the instructions.  A copy of instructions were sent to the patient via [facility] unless otherwise noted below.     The patient was advised to call back or seek an in-person evaluation if the symptoms worsen or if the condition fails to improve as anticipated.  Time:  I spent 10 minutes with the patient via telehealth technology discussing the above problems/concerns.    Sherry Nims, FNP

## 2022-08-06 NOTE — Patient Instructions (Signed)

## 2022-08-07 ENCOUNTER — Other Ambulatory Visit: Payer: Self-pay | Admitting: Internal Medicine

## 2022-08-07 ENCOUNTER — Other Ambulatory Visit (INDEPENDENT_AMBULATORY_CARE_PROVIDER_SITE_OTHER): Payer: Commercial Managed Care - PPO

## 2022-08-07 DIAGNOSIS — D72829 Elevated white blood cell count, unspecified: Secondary | ICD-10-CM

## 2022-08-08 ENCOUNTER — Other Ambulatory Visit: Payer: Self-pay | Admitting: Internal Medicine

## 2022-08-08 ENCOUNTER — Other Ambulatory Visit: Payer: Self-pay

## 2022-08-08 ENCOUNTER — Other Ambulatory Visit (HOSPITAL_COMMUNITY): Payer: Self-pay

## 2022-08-08 LAB — CBC WITH DIFFERENTIAL/PLATELET
Basophils Absolute: 0 10*3/uL (ref 0.0–0.1)
Basophils Relative: 0.4 % (ref 0.0–3.0)
Eosinophils Absolute: 0.2 10*3/uL (ref 0.0–0.7)
Eosinophils Relative: 2.1 % (ref 0.0–5.0)
HCT: 35.8 % — ABNORMAL LOW (ref 36.0–46.0)
Hemoglobin: 11.7 g/dL — ABNORMAL LOW (ref 12.0–15.0)
Lymphocytes Relative: 28.2 % (ref 12.0–46.0)
Lymphs Abs: 2.6 10*3/uL (ref 0.7–4.0)
MCHC: 32.7 g/dL (ref 30.0–36.0)
MCV: 83.5 fl (ref 78.0–100.0)
Monocytes Absolute: 0.7 10*3/uL (ref 0.1–1.0)
Monocytes Relative: 8 % (ref 3.0–12.0)
Neutro Abs: 5.7 10*3/uL (ref 1.4–7.7)
Neutrophils Relative %: 61.3 % (ref 43.0–77.0)
Platelets: 338 10*3/uL (ref 150.0–400.0)
RBC: 4.29 Mil/uL (ref 3.87–5.11)
RDW: 13.4 % (ref 11.5–15.5)
WBC: 9.2 10*3/uL (ref 4.0–10.5)

## 2022-08-08 LAB — VITAMIN B12: Vitamin B-12: 311 pg/mL (ref 211–911)

## 2022-08-08 LAB — VITAMIN D 25 HYDROXY (VIT D DEFICIENCY, FRACTURES): VITD: 11.97 ng/mL — ABNORMAL LOW (ref 30.00–100.00)

## 2022-08-08 MED ORDER — VITAMIN B-12 1000 MCG PO TABS
1000.0000 ug | ORAL_TABLET | Freq: Every day | ORAL | 3 refills | Status: AC
  Filled 2022-08-08: qty 90, 90d supply, fill #0

## 2022-08-08 MED ORDER — VITAMIN D (ERGOCALCIFEROL) 1.25 MG (50000 UNIT) PO CAPS
50000.0000 [IU] | ORAL_CAPSULE | ORAL | 0 refills | Status: DC
  Filled 2022-08-08: qty 12, 84d supply, fill #0

## 2022-08-11 ENCOUNTER — Other Ambulatory Visit: Payer: Self-pay

## 2022-08-11 ENCOUNTER — Ambulatory Visit: Payer: Commercial Managed Care - PPO | Admitting: Internal Medicine

## 2022-08-11 ENCOUNTER — Encounter: Payer: Self-pay | Admitting: Internal Medicine

## 2022-08-11 VITALS — BP 112/60 | HR 81 | Temp 98.7°F | Ht 65.0 in | Wt 168.0 lb

## 2022-08-11 DIAGNOSIS — E559 Vitamin D deficiency, unspecified: Secondary | ICD-10-CM | POA: Diagnosis not present

## 2022-08-11 DIAGNOSIS — F418 Other specified anxiety disorders: Secondary | ICD-10-CM

## 2022-08-11 MED ORDER — HYDROXYZINE HCL 10 MG PO TABS
10.0000 mg | ORAL_TABLET | Freq: Three times a day (TID) | ORAL | 0 refills | Status: DC | PRN
  Filled 2022-08-11: qty 5, 2d supply, fill #0

## 2022-08-11 NOTE — Assessment & Plan Note (Signed)
Triggered by situations outside her control. Counseled about coping skills, deep breathing. Rx hydroxyzine 10 mg prn panic episodes #5. No long term medication is needed.

## 2022-08-11 NOTE — Assessment & Plan Note (Signed)
Recurrent off supplementation and will plan to keep her on vitamin D 50000 weekly until physical in the fall and then recheck vitamin D levels.

## 2022-08-11 NOTE — Progress Notes (Signed)
   Subjective:   Patient ID: Sherry Sheppard, female    DOB: 18-May-1986, 37 y.o.   MRN: [id number]  HPI The patient is coming in for follow up to discuss labs and urgent care follow up. Feeling better. Still fatigue.  Review of Systems  Constitutional: Negative.   HENT: Negative.    Eyes: Negative.   Respiratory:  Negative for cough, chest tightness and shortness of breath.   Cardiovascular:  Negative for chest pain, palpitations and leg swelling.  Gastrointestinal:  Negative for abdominal distention, abdominal pain, constipation, diarrhea, nausea and vomiting.       Loose stools  Musculoskeletal: Negative.   Skin: Negative.   Neurological: Negative.   Psychiatric/Behavioral: Negative.      Objective:  Physical Exam Constitutional:      Appearance: She is well-developed.  HENT:     Head: Normocephalic and atraumatic.  Cardiovascular:     Rate and Rhythm: Normal rate and regular rhythm.  Pulmonary:     Effort: Pulmonary effort is normal. No respiratory distress.     Breath sounds: Normal breath sounds. No wheezing or rales.  Abdominal:     General: Bowel sounds are normal. There is no distension.     Palpations: Abdomen is soft.     Tenderness: There is no abdominal tenderness. There is no rebound.  Musculoskeletal:     Cervical back: Normal range of motion.  Skin:    General: Skin is warm and dry.  Neurological:     Mental Status: She is alert and oriented to person, place, and time.     Coordination: Coordination normal.     Vitals:   08/11/22 1117  BP: 112/60  Pulse: 81  Temp: 98.7 F (37.1 C)  TempSrc: Oral  SpO2: 99%  Weight: 168 lb (76.2 kg)  Height: 5\' 5"  (1.651 m)    Assessment & Plan:

## 2022-08-11 NOTE — Patient Instructions (Signed)
I have sent in the hydroxyzine

## 2022-10-03 DIAGNOSIS — H18603 Keratoconus, unspecified, bilateral: Secondary | ICD-10-CM | POA: Diagnosis not present

## 2022-10-24 ENCOUNTER — Other Ambulatory Visit (HOSPITAL_COMMUNITY): Payer: Self-pay

## 2022-10-24 ENCOUNTER — Other Ambulatory Visit: Payer: Self-pay

## 2022-10-24 ENCOUNTER — Encounter (HOSPITAL_COMMUNITY): Payer: Self-pay

## 2022-10-24 MED ORDER — VITAMIN D (ERGOCALCIFEROL) 1.25 MG (50000 UNIT) PO CAPS
50000.0000 [IU] | ORAL_CAPSULE | ORAL | 0 refills | Status: AC
  Filled 2022-10-24 – 2022-10-27 (×2): qty 12, 84d supply, fill #0

## 2022-10-25 ENCOUNTER — Other Ambulatory Visit (HOSPITAL_COMMUNITY): Payer: Self-pay

## 2022-10-27 ENCOUNTER — Other Ambulatory Visit (HOSPITAL_COMMUNITY): Payer: Self-pay

## 2022-11-03 ENCOUNTER — Other Ambulatory Visit (HOSPITAL_COMMUNITY): Payer: Self-pay

## 2022-11-13 ENCOUNTER — Ambulatory Visit (INDEPENDENT_AMBULATORY_CARE_PROVIDER_SITE_OTHER): Payer: Commercial Managed Care - PPO | Admitting: Family Medicine

## 2022-11-13 ENCOUNTER — Ambulatory Visit (INDEPENDENT_AMBULATORY_CARE_PROVIDER_SITE_OTHER): Payer: Commercial Managed Care - PPO

## 2022-11-13 ENCOUNTER — Encounter: Payer: Self-pay | Admitting: Family Medicine

## 2022-11-13 VITALS — BP 104/78 | HR 90 | Ht 65.0 in | Wt 170.2 lb

## 2022-11-13 DIAGNOSIS — M79671 Pain in right foot: Secondary | ICD-10-CM

## 2022-11-13 NOTE — Progress Notes (Signed)
   I, Stevenson Clinch, CMA acting as a scribe for Clementeen Graham, MD.  Sherry Sheppard is a 37 y.o. female who presents to [facility] at [facility] today for right foot pain.  Patient was previously seen by Dr. Katrinka Blazing in 2022 for right hand pain.  Today, patient c/o right foot pain, on going, worsening since Saturday, after daughter dropped a glass mirror on the foot.  Patient locates pain to great toe area. Bruising and swelling present. Took IBU and applied ice after injury. Sx worse with ambulation, not as bad with WB.   She has tried some ibuprofen which helped a little.  She works a second job.  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  Pertinent review of systems: No fevers or chills  Relevant historical information: History of prior fracture right foot   Exam:  BP 104/78   Pulse 90   Ht 5\' 5"  (1.651 m)   Wt 170 lb 3.2 oz (77.2 kg)   SpO2 98%   BMI 28.32 kg/m  General: Well Developed, well nourished, and in no acute distress.   MSK: Right foot swelling dorsal medial forefoot at overlying first and metatarsals. Normal foot and ankle motion. Pulses cap refill and sensation are intact. Tender palpation overlying the distal first and second metatarsals Normal toe motion.   Lab and Radiology Results  X-ray images right foot obtained today personally and independently interpreted. No significant degenerative changes.  No acute fractures are visible. Await formal radiology review    Assessment and Plan: 37 y.o. female with dorsal foot contusion.  Plan for immobilization with CAM Walker boot or postop shoe.  Recommend Voltaren gel.  Recheck if not improving.  May consider turf toe insoles in the near future.   PDMP not reviewed this encounter. Orders Placed This Encounter  Procedures   DG Foot Complete Right    Standing Status:   Future    Number of Occurrences:   1    Standing Expiration  Date:   11/13/2023    Order Specific Question:   Reason for Exam (SYMPTOM  OR DIAGNOSIS REQUIRED)    Answer:   right foot injury, hx of fracture    Order Specific Question:   Preferred imaging location?    Answer:   [facility]    Order Specific Question:   Is patient pregnant?    Answer:   No   No orders of the defined types were placed in this encounter.    Discussed warning signs or symptoms. Please see discharge instructions. Patient expresses understanding.   The above documentation has been reviewed and is accurate and complete Clementeen Graham, M.D.

## 2022-11-13 NOTE — Patient Instructions (Addendum)
Thank you for coming in today.   Use a cam walker boot. Ok to get a short one.   Wean to a turf toe insole.   Ibuprofen 800mg  every 8 hours.   Tylenol arthritis 2 every 8 hours.   [employer].

## 2022-11-17 NOTE — Progress Notes (Signed)
Right foot x-ray looks normal to radiology

## 2022-11-22 ENCOUNTER — Encounter: Payer: Self-pay | Admitting: Internal Medicine

## 2022-11-22 ENCOUNTER — Other Ambulatory Visit (INDEPENDENT_AMBULATORY_CARE_PROVIDER_SITE_OTHER): Payer: Commercial Managed Care - PPO

## 2022-11-22 DIAGNOSIS — E538 Deficiency of other specified B group vitamins: Secondary | ICD-10-CM

## 2022-11-22 DIAGNOSIS — D509 Iron deficiency anemia, unspecified: Secondary | ICD-10-CM

## 2022-11-22 DIAGNOSIS — E559 Vitamin D deficiency, unspecified: Secondary | ICD-10-CM | POA: Diagnosis not present

## 2022-11-22 LAB — CBC
HCT: 37.2 % (ref 36.0–46.0)
Hemoglobin: 12.2 g/dL (ref 12.0–15.0)
MCHC: 32.9 g/dL (ref 30.0–36.0)
MCV: 84.9 fl (ref 78.0–100.0)
Platelets: 351 10*3/uL (ref 150.0–400.0)
RBC: 4.39 Mil/uL (ref 3.87–5.11)
RDW: 13.6 % (ref 11.5–15.5)
WBC: 10.2 10*3/uL (ref 4.0–10.5)

## 2022-11-22 LAB — COMPREHENSIVE METABOLIC PANEL
ALT: 11 U/L (ref 0–35)
AST: 15 U/L (ref 0–37)
Albumin: 4.2 g/dL (ref 3.5–5.2)
Alkaline Phosphatase: 74 U/L (ref 39–117)
BUN: 8 mg/dL (ref 6–23)
CO2: 29 mEq/L (ref 19–32)
Calcium: 9.4 mg/dL (ref 8.4–10.5)
Chloride: 103 mEq/L (ref 96–112)
Creatinine, Ser: 0.73 mg/dL (ref 0.40–1.20)
GFR: 106.21 mL/min (ref >60.00)
Glucose, Bld: 64 mg/dL — ABNORMAL LOW (ref 70–99)
Potassium: 3.9 mEq/L (ref 3.5–5.1)
Sodium: 138 mEq/L (ref 135–145)
Total Bilirubin: 0.4 mg/dL (ref 0.2–1.2)
Total Protein: 7.8 g/dL (ref 6.0–8.3)

## 2022-11-22 LAB — VITAMIN D 25 HYDROXY (VIT D DEFICIENCY, FRACTURES): VITD: 39 ng/mL (ref 30.00–100.00)

## 2022-11-22 LAB — VITAMIN B12: Vitamin B-12: 318 pg/mL (ref 211–911)

## 2022-11-22 LAB — FERRITIN: Ferritin: 78.2 ng/mL (ref 10.0–291.0)

## 2022-11-24 ENCOUNTER — Ambulatory Visit (INDEPENDENT_AMBULATORY_CARE_PROVIDER_SITE_OTHER): Payer: Commercial Managed Care - PPO

## 2022-11-24 DIAGNOSIS — E538 Deficiency of other specified B group vitamins: Secondary | ICD-10-CM

## 2022-11-24 MED ORDER — CYANOCOBALAMIN 1000 MCG/ML IJ SOLN
1000.0000 ug | Freq: Once | INTRAMUSCULAR | Status: AC
  Administered 2022-11-24: 1000 ug via INTRAMUSCULAR

## 2022-11-24 NOTE — Progress Notes (Signed)
Pt was given B12 injection with no complications. 

## 2022-11-27 ENCOUNTER — Telehealth: Payer: Commercial Managed Care - PPO | Admitting: Family Medicine

## 2022-11-27 ENCOUNTER — Other Ambulatory Visit: Payer: Self-pay

## 2022-11-27 DIAGNOSIS — R3989 Other symptoms and signs involving the genitourinary system: Secondary | ICD-10-CM | POA: Diagnosis not present

## 2022-11-27 MED ORDER — NITROFURANTOIN MONOHYD MACRO 100 MG PO CAPS
100.0000 mg | ORAL_CAPSULE | Freq: Two times a day (BID) | ORAL | 0 refills | Status: AC
  Filled 2022-11-27 – 2022-11-28 (×2): qty 10, 5d supply, fill #0

## 2022-11-27 NOTE — Progress Notes (Signed)

## 2022-11-28 ENCOUNTER — Other Ambulatory Visit (HOSPITAL_COMMUNITY): Payer: Self-pay

## 2022-12-01 ENCOUNTER — Ambulatory Visit: Payer: Commercial Managed Care - PPO

## 2022-12-01 ENCOUNTER — Encounter (HOSPITAL_COMMUNITY): Payer: Self-pay

## 2022-12-01 ENCOUNTER — Other Ambulatory Visit (HOSPITAL_COMMUNITY): Payer: Self-pay

## 2022-12-01 ENCOUNTER — Encounter: Payer: Self-pay | Admitting: Internal Medicine

## 2022-12-01 ENCOUNTER — Ambulatory Visit (INDEPENDENT_AMBULATORY_CARE_PROVIDER_SITE_OTHER): Payer: Commercial Managed Care - PPO | Admitting: Internal Medicine

## 2022-12-01 ENCOUNTER — Other Ambulatory Visit: Payer: Self-pay

## 2022-12-01 VITALS — BP 100/64 | HR 97 | Temp 98.8°F | Ht 65.0 in | Wt 171.0 lb

## 2022-12-01 DIAGNOSIS — H9201 Otalgia, right ear: Secondary | ICD-10-CM | POA: Insufficient documentation

## 2022-12-01 DIAGNOSIS — E538 Deficiency of other specified B group vitamins: Secondary | ICD-10-CM | POA: Diagnosis not present

## 2022-12-01 MED ORDER — CEPHALEXIN 500 MG PO CAPS
500.0000 mg | ORAL_CAPSULE | Freq: Two times a day (BID) | ORAL | 0 refills | Status: AC
  Filled 2022-12-01 (×2): qty 10, 5d supply, fill #0

## 2022-12-01 MED ORDER — CYANOCOBALAMIN 1000 MCG/ML IJ SOLN
1000.0000 ug | Freq: Once | INTRAMUSCULAR | Status: AC
  Administered 2022-12-01: 1000 ug via INTRAMUSCULAR

## 2022-12-01 NOTE — Assessment & Plan Note (Signed)
Due for B12 which is given today. This is 2/4 weekly shots and will maintain oral 1000 mcg daily ongoing.

## 2022-12-01 NOTE — Patient Instructions (Addendum)
We have sent in the keflex to take 1 pill twice a day for 5 days.  We have given you the B12 shot.

## 2022-12-01 NOTE — Progress Notes (Signed)
   Subjective:   Patient ID: Sherry Sheppard, female    DOB: Sep 12, 1985, 37 y.o.   MRN: [id number]  HPI The patient is a 37 YO female coming in for right ear pain  Review of Systems  Constitutional: Negative.   HENT:  Positive for ear pain.   Eyes: Negative.   Respiratory:  Negative for cough, chest tightness and shortness of breath.   Cardiovascular:  Negative for chest pain, palpitations and leg swelling.  Gastrointestinal:  Negative for abdominal distention, abdominal pain, constipation, diarrhea, nausea and vomiting.  Musculoskeletal: Negative.   Skin: Negative.   Neurological: Negative.   Psychiatric/Behavioral: Negative.      Objective:  Physical Exam Constitutional:      Appearance: She is well-developed.  HENT:     Head: Normocephalic and atraumatic.     Ears:     Comments: Right ear wound with scab external lower ear with some redness and swelling, canal with mild redness, TM slight bulge right, left ear and TM normal  Cardiovascular:     Rate and Rhythm: Normal rate and regular rhythm.  Pulmonary:     Effort: Pulmonary effort is normal. No respiratory distress.     Breath sounds: Normal breath sounds. No wheezing or rales.  Abdominal:     General: Bowel sounds are normal. There is no distension.     Palpations: Abdomen is soft.     Tenderness: There is no abdominal tenderness. There is no rebound.  Musculoskeletal:     Cervical back: Normal range of motion.  Skin:    General: Skin is warm and dry.  Neurological:     Mental Status: She is alert and oriented to person, place, and time.     Coordination: Coordination normal.     Vitals:   12/01/22 1254  BP: 100/64  Pulse: 97  Temp: 98.8 F (37.1 C)  TempSrc: Oral  SpO2: 93%  Weight: 171 lb (77.6 kg)  Height: 5\' 5"  (1.651 m)    Assessment & Plan:  B12 given at visit

## 2022-12-01 NOTE — Assessment & Plan Note (Signed)
Wound with scab on the external ear with some redness and swelling. Rx keflex 5 day course. Okay to stop macrobid as this will co-treat remaining UTI symptoms to avoid two antibiotics at once.

## 2022-12-08 ENCOUNTER — Ambulatory Visit (INDEPENDENT_AMBULATORY_CARE_PROVIDER_SITE_OTHER): Payer: Commercial Managed Care - PPO | Admitting: *Deleted

## 2022-12-08 DIAGNOSIS — E538 Deficiency of other specified B group vitamins: Secondary | ICD-10-CM

## 2022-12-08 MED ORDER — CYANOCOBALAMIN 1000 MCG/ML IJ SOLN
1000.0000 ug | Freq: Once | INTRAMUSCULAR | Status: AC
  Administered 2022-12-08: 1000 ug via INTRAMUSCULAR

## 2022-12-08 NOTE — Progress Notes (Signed)
Pls cosign for B12 inj../lmb  

## 2022-12-15 ENCOUNTER — Ambulatory Visit (INDEPENDENT_AMBULATORY_CARE_PROVIDER_SITE_OTHER): Payer: Commercial Managed Care - PPO

## 2022-12-15 DIAGNOSIS — E538 Deficiency of other specified B group vitamins: Secondary | ICD-10-CM | POA: Diagnosis not present

## 2022-12-15 MED ORDER — CYANOCOBALAMIN 1000 MCG/ML IJ SOLN
1000.0000 ug | Freq: Once | INTRAMUSCULAR | Status: AC
  Administered 2022-12-15: 1000 ug via INTRAMUSCULAR

## 2022-12-15 NOTE — Progress Notes (Signed)
Pt was given B12 injection with no complications. 

## 2023-01-03 ENCOUNTER — Ambulatory Visit: Payer: Commercial Managed Care - PPO | Admitting: Obstetrics

## 2023-02-27 ENCOUNTER — Other Ambulatory Visit (HOSPITAL_COMMUNITY)
Admission: RE | Admit: 2023-02-27 | Discharge: 2023-02-27 | Disposition: A | Discharge status: Home / Self Care | Payer: Commercial Managed Care - PPO | Source: Ambulatory Visit | Attending: Obstetrics | Admitting: Obstetrics

## 2023-02-27 ENCOUNTER — Encounter: Payer: Self-pay | Admitting: Obstetrics and Gynecology

## 2023-02-27 ENCOUNTER — Ambulatory Visit (INDEPENDENT_AMBULATORY_CARE_PROVIDER_SITE_OTHER): Payer: Commercial Managed Care - PPO | Admitting: Obstetrics and Gynecology

## 2023-02-27 VITALS — BP 108/68 | HR 83 | Ht 65.0 in | Wt 160.4 lb

## 2023-02-27 DIAGNOSIS — Z01419 Encounter for gynecological examination (general) (routine) without abnormal findings: Secondary | ICD-10-CM | POA: Diagnosis not present

## 2023-02-27 DIAGNOSIS — R6882 Decreased libido: Secondary | ICD-10-CM | POA: Diagnosis not present

## 2023-02-27 DIAGNOSIS — Z113 Encounter for screening for infections with a predominantly sexual mode of transmission: Secondary | ICD-10-CM

## 2023-02-27 DIAGNOSIS — Z0000 Encounter for general adult medical examination without abnormal findings: Secondary | ICD-10-CM

## 2023-02-27 NOTE — Progress Notes (Signed)
Pt presents for AEX. Last PAP 12-20-21 Requesting PAP and STD testing Pt has concerns about hormonal levels and low libido.

## 2023-02-27 NOTE — Progress Notes (Signed)
Subjective:     Sherry Sheppard is a 37 y.o. female here at [facility] for a routine exam.  Current complaints: low libido.  Has noticed low libido after birth of her last child (4 years ago). She notes having little desire to have sex and difficulty with arousal. She also notes that her 37 yo sleeps in her bed, and she has little time for herself and her relationship with her husband. Denies any pain with intercourse, vaginal dryness, weight gain, depression, anxiety. Is purposefully losing weight and is feeling better about herself. Feels tired frequently and works several jobs. No vaginal discharge, bleeding, urinary symptoms. No Fhx of early diabetes, thyroid disease, breast/colon cancers.  Do you have a primary care provider? Yes, Hillard Danker, MD Do you feel safe at home? Yes  Flowsheet Row Office Visit from 02/27/2023 in [facility] at [facility] Total Score 0       Health Maintenance Due  Topic Date Due   HPV VACCINES (2 - 3-dose series) 07/27/2011   COVID-19 Vaccine (3 - 2023-24 season) 03/10/2022   INFLUENZA VACCINE  02/08/2023     Risk factors for chronic health problems: None Smoking: No Alchohol/how much: Rarely Pt BMI: Body mass index is 26.69 kg/m.   Gynecologic History No LMP recorded. (Menstrual status: IUD). Contraception: IUD Last Pap: 12/20/21. Results were: normal  Obstetric History OB History  Gravida Para Term Preterm AB Living  4 2 2   1 2   SAB IAB Ectopic Multiple Live Births  1       2    # Outcome Date GA Lbr Len/2nd Weight Sex Type Anes PTL Lv  4 Gravida           3 Term 08/24/12 [redacted]w[redacted]d  8 lb 0.8 oz (3.65 kg) M CS-Vac Spinal  LIV     Birth Comments: had seizure during last CS had an AVM completely resolved as of 6/19 had surgery 12/2012  2 SAB 2013             Birth Comments: System Generated. Please review and update pregnancy details.  1 Term 04/07/08    M CS-LTranv   LIV     Birth Comments: c/S  because FTP and fetal distress   The following portions of the patient's history were reviewed and updated as appropriate: allergies, current medications, past family history, past medical history, past social history, past surgical history, and problem list.  Review of Systems Pertinent items are noted in HPI.    Objective:  BP 108/68   Pulse 83   Ht 5\' 5"  (1.651 m)   Wt 160 lb 6.4 oz (72.8 kg)   BMI 26.69 kg/m   VS reviewed, nursing note reviewed,  Constitutional: well developed, well nourished, no distress HEENT: normocephalic, thyroid without enlargement or mass HEART: RRR, no murmurs rubs/gallops RESP: clear and equal to auscultation bilaterally in all lobes  Abdomen: soft Neuro: alert and oriented x 3 Skin: warm, dry Psych: affect normal Pelvic/breast exams: deferred after shared decision making with patient  Assessment/Plan:   1. Routine screening for STI (sexually transmitted infection) Self-swab today. Has upcoming PCP appointment and will have blood draw at that time. - Cervicovaginal ancillary only( [facility]) - HEP C AB W/REFL - HIV Antibody (routine testing w rflx); Future - HIV4GL Save Tube; Future - RPR  2. Well woman exam (no gynecological exam) - Discussed ACOG guidelines which recommend Q5 year paps for women with  history of normal co-testing above age 98. Patient feels best with continuing pap smears every other year. Will plan on pap in 2025. - Start MMG at 41 - IUD will need replaced 6/28 - Follows with PCP for other lab monitoring and healthcare maintenance  3. Low libido New problem, uncontrolled. Suspect patient is having low libido 2/2 fatigue and lack of time to promote intimacy between her and her husband. Picture not consistent with POI, thyroid disease, MDD/GAD, medication side-effect. - Discussed having clear conversation with her husband regarding her feelings of low libido - Recommended lifestyle changes and sex therapist if needed    Return in about 1 year (around 02/27/2024) for Annual.   Joanne Gavel, MD 2:59 PM

## 2023-02-27 NOTE — Addendum Note (Signed)
Addended by: Joanne Gavel on: 02/27/2023 03:17 PM   Modules accepted: Level of Service

## 2023-02-28 LAB — CERVICOVAGINAL ANCILLARY ONLY
Chlamydia: NEGATIVE
Comment: NEGATIVE
Comment: NEGATIVE
Comment: NORMAL
Neisseria Gonorrhea: NEGATIVE
Trichomonas: NEGATIVE

## 2023-03-08 ENCOUNTER — Ambulatory Visit (INDEPENDENT_AMBULATORY_CARE_PROVIDER_SITE_OTHER): Payer: Commercial Managed Care - PPO | Admitting: Internal Medicine

## 2023-03-08 ENCOUNTER — Encounter: Payer: Self-pay | Admitting: Internal Medicine

## 2023-03-08 ENCOUNTER — Other Ambulatory Visit: Payer: Self-pay

## 2023-03-08 ENCOUNTER — Encounter: Payer: Self-pay | Admitting: Pharmacist

## 2023-03-08 ENCOUNTER — Other Ambulatory Visit (HOSPITAL_COMMUNITY): Payer: Self-pay

## 2023-03-08 VITALS — BP 112/62 | HR 93 | Temp 98.8°F | Ht 65.0 in | Wt 156.5 lb

## 2023-03-08 DIAGNOSIS — Z0000 Encounter for general adult medical examination without abnormal findings: Secondary | ICD-10-CM

## 2023-03-08 DIAGNOSIS — E538 Deficiency of other specified B group vitamins: Secondary | ICD-10-CM

## 2023-03-08 DIAGNOSIS — F418 Other specified anxiety disorders: Secondary | ICD-10-CM

## 2023-03-08 DIAGNOSIS — E559 Vitamin D deficiency, unspecified: Secondary | ICD-10-CM | POA: Diagnosis not present

## 2023-03-08 MED ORDER — HYDROXYZINE HCL 10 MG PO TABS
10.0000 mg | ORAL_TABLET | Freq: Three times a day (TID) | ORAL | 0 refills | Status: DC | PRN
  Filled 2023-03-08 – 2023-03-16 (×3): qty 20, 4d supply, fill #0

## 2023-03-08 NOTE — Progress Notes (Signed)
   Subjective:   Patient ID: Sherry Sheppard, female    DOB: [birth year], 37 y.o.   MRN: [id number]  HPI The patient is here for physical.  PMH, [facility], social history reviewed and updated  Review of Systems  Constitutional: Negative.   HENT: Negative.    Eyes: Negative.   Respiratory:  Negative for cough, chest tightness and shortness of breath.   Cardiovascular:  Negative for chest pain, palpitations and leg swelling.  Gastrointestinal:  Negative for abdominal distention, abdominal pain, constipation, diarrhea, nausea and vomiting.  Musculoskeletal: Negative.   Skin: Negative.   Neurological: Negative.   Psychiatric/Behavioral: Negative.      Objective:  Physical Exam Constitutional:      Appearance: She is well-developed.  HENT:     Head: Normocephalic and atraumatic.  Cardiovascular:     Rate and Rhythm: Normal rate and regular rhythm.  Pulmonary:     Effort: Pulmonary effort is normal. No respiratory distress.     Breath sounds: Normal breath sounds. No wheezing or rales.  Abdominal:     General: Bowel sounds are normal. There is no distension.     Palpations: Abdomen is soft.     Tenderness: There is no abdominal tenderness. There is no rebound.  Musculoskeletal:     Cervical back: Normal range of motion.  Skin:    General: Skin is warm and dry.  Neurological:     Mental Status: She is alert and oriented to person, place, and time.     Coordination: Coordination normal.     Vitals:   03/08/23 0759  BP: 112/62  Pulse: 93  Temp: 98.8 F (37.1 C)  TempSrc: Oral  SpO2: 98%  Weight: 156 lb 8 oz (71 kg)  Height: 5\' 5"  (1.651 m)    Assessment & Plan:

## 2023-03-09 NOTE — Assessment & Plan Note (Signed)
Checking vitamin D levels and adjust as needed. Finish weekly replacement.

## 2023-03-09 NOTE — Assessment & Plan Note (Signed)
Checking B12 level and adjust as needed.  

## 2023-03-09 NOTE — Assessment & Plan Note (Signed)
Flu shot yearly. Tetanus up to date. Pap smear up to date. Counseled about sun safety and mole surveillance. Counseled about the dangers of distracted driving. Given 10 year screening recommendations.   

## 2023-03-09 NOTE — Assessment & Plan Note (Signed)
Ok to increase hydroxyzine to 20 mg prn and is using coping skills to help as well.

## 2023-03-13 ENCOUNTER — Encounter (HOSPITAL_COMMUNITY): Payer: Self-pay | Admitting: *Deleted

## 2023-03-13 ENCOUNTER — Emergency Department (HOSPITAL_COMMUNITY): Payer: Commercial Managed Care - PPO

## 2023-03-13 ENCOUNTER — Emergency Department (HOSPITAL_COMMUNITY)
Admission: EM | Admit: 2023-03-13 | Discharge: 2023-03-13 | Disposition: A | Discharge status: Home / Self Care | Payer: Commercial Managed Care - PPO | Attending: Emergency Medicine | Admitting: Emergency Medicine

## 2023-03-13 ENCOUNTER — Other Ambulatory Visit: Payer: Self-pay

## 2023-03-13 ENCOUNTER — Other Ambulatory Visit (HOSPITAL_COMMUNITY): Payer: Self-pay

## 2023-03-13 DIAGNOSIS — R739 Hyperglycemia, unspecified: Secondary | ICD-10-CM | POA: Diagnosis not present

## 2023-03-13 DIAGNOSIS — K219 Gastro-esophageal reflux disease without esophagitis: Secondary | ICD-10-CM | POA: Diagnosis not present

## 2023-03-13 DIAGNOSIS — E876 Hypokalemia: Secondary | ICD-10-CM | POA: Insufficient documentation

## 2023-03-13 DIAGNOSIS — R1013 Epigastric pain: Secondary | ICD-10-CM | POA: Insufficient documentation

## 2023-03-13 DIAGNOSIS — R079 Chest pain, unspecified: Secondary | ICD-10-CM | POA: Diagnosis not present

## 2023-03-13 DIAGNOSIS — R109 Unspecified abdominal pain: Secondary | ICD-10-CM | POA: Diagnosis present

## 2023-03-13 DIAGNOSIS — R7309 Other abnormal glucose: Secondary | ICD-10-CM

## 2023-03-13 DIAGNOSIS — R1011 Right upper quadrant pain: Secondary | ICD-10-CM | POA: Diagnosis not present

## 2023-03-13 LAB — CBC
HCT: 40.6 % (ref 36.0–46.0)
Hemoglobin: 12.7 g/dL (ref 12.0–15.0)
MCH: 27.6 pg (ref 26.0–34.0)
MCHC: 31.3 g/dL (ref 30.0–36.0)
MCV: 88.3 fL (ref 80.0–100.0)
Platelets: 362 10*3/uL (ref 150–400)
RBC: 4.6 MIL/uL (ref 3.87–5.11)
RDW: 12.8 % (ref 11.5–15.5)
WBC: 11.6 10*3/uL — ABNORMAL HIGH (ref 4.0–10.5)
nRBC: 0 % (ref 0.0–0.2)

## 2023-03-13 LAB — BASIC METABOLIC PANEL
Anion gap: 14 (ref 5–15)
BUN: 10 mg/dL (ref 6–20)
CO2: 24 mmol/L (ref 22–32)
Calcium: 9.3 mg/dL (ref 8.9–10.3)
Chloride: 101 mmol/L (ref 98–111)
Creatinine, Ser: 0.82 mg/dL (ref 0.44–1.00)
GFR, Estimated: >60 mL/min (ref >60)
Glucose, Bld: 125 mg/dL — ABNORMAL HIGH (ref 70–99)
Potassium: 3.3 mmol/L — ABNORMAL LOW (ref 3.5–5.1)
Sodium: 139 mmol/L (ref 135–145)

## 2023-03-13 LAB — HEPATIC FUNCTION PANEL
ALT: 10 U/L (ref 0–44)
AST: 17 U/L (ref 15–41)
Albumin: 3.9 g/dL (ref 3.5–5.0)
Alkaline Phosphatase: 67 U/L (ref 38–126)
Bilirubin, Direct: <0.1 mg/dL (ref 0.0–0.2)
Total Bilirubin: 0.4 mg/dL (ref 0.3–1.2)
Total Protein: 7.7 g/dL (ref 6.5–8.1)

## 2023-03-13 LAB — TROPONIN I (HIGH SENSITIVITY)
Troponin I (High Sensitivity): 3 ng/L (ref <18)
Troponin I (High Sensitivity): 3 ng/L (ref <18)

## 2023-03-13 LAB — HCG, SERUM, QUALITATIVE: Preg, Serum: NEGATIVE

## 2023-03-13 LAB — LIPASE, BLOOD: Lipase: 29 U/L (ref 11–51)

## 2023-03-13 MED ORDER — ALUM & MAG HYDROXIDE-SIMETH 200-200-20 MG/5ML PO SUSP
30.0000 mL | Freq: Once | ORAL | Status: AC
  Administered 2023-03-13: 30 mL via ORAL
  Filled 2023-03-13: qty 30

## 2023-03-13 MED ORDER — PANTOPRAZOLE SODIUM 40 MG IV SOLR
40.0000 mg | Freq: Once | INTRAVENOUS | Status: AC
  Administered 2023-03-13: 40 mg via INTRAVENOUS
  Filled 2023-03-13: qty 10

## 2023-03-13 MED ORDER — POTASSIUM CHLORIDE CRYS ER 20 MEQ PO TBCR
40.0000 meq | EXTENDED_RELEASE_TABLET | Freq: Once | ORAL | Status: AC
  Administered 2023-03-13: 40 meq via ORAL
  Filled 2023-03-13: qty 2

## 2023-03-13 MED ORDER — PANTOPRAZOLE SODIUM 40 MG PO TBEC
40.0000 mg | DELAYED_RELEASE_TABLET | Freq: Every day | ORAL | 0 refills | Status: DC
  Filled 2023-03-13 – 2023-03-16 (×2): qty 30, 30d supply, fill #0

## 2023-03-13 NOTE — ED Provider Notes (Signed)
[place] [facility] AT [facility] Provider Note   CSN: [id number] Arrival date & time: 03/13/23  0254     History  Chief Complaint  Patient presents with   Abdominal Pain    Sherry Sheppard is a 37 y.o. female.  The history is provided by the patient.  Abdominal Pain She had diarrhea yesterday morning followed by crampy abdominal pain which had resolved.  Today, she has had pain across her upper abdomen with some radiation around to the sides but not to the back.  It also has radiated up towards her chest.  She denies nausea or vomiting.  She has had no further diarrhea although she states that she has felt generally weak.  She denies fever, chills, sweats.   Home Medications Prior to Admission medications   Medication Sig Start Date End Date Taking? Authorizing Provider  cyanocobalamin (VITAMIN B12) 1000 MCG tablet Take 1 tablet (1,000 mcg total) by mouth daily. 08/08/22   Myrlene Broker, MD  fluticasone Aleda Grana) 50 MCG/ACT nasal spray Place 2 sprays into both nostrils daily. 10/22/20   Junie Spencer, FNP  hydrOXYzine (ATARAX) 10 MG tablet Take 1-2 tablets (10-20 mg total) by mouth 3 (three) times daily as needed for anxiety. 03/08/23   Myrlene Broker, MD  ibuprofen (ADVIL) 800 MG tablet Take 1 tablet (800 mg total) by mouth every 8 (eight) hours as needed. 12/20/21   Brock Bad, MD  levonorgestrel (MIRENA) 20 MCG/24HR IUD 1 each by Intrauterine route once.    [provider]  loratadine (CLARITIN) 10 MG tablet Take 1 tablet (10 mg total) by mouth daily. 12/20/21   Brock Bad, MD  Vitamin D, Ergocalciferol, (DRISDOL) 1.25 MG (50000 UNIT) CAPS capsule Take 1 capsule by mouth every 7 days. 10/24/22   Myrlene Broker, MD      Allergies    Banana, Gadobenate, Other, and Procardia [nifedipine]    Review of Systems   Review of Systems  Gastrointestinal:  Positive for abdominal pain.  All other systems reviewed and  are negative.   Physical Exam Updated Vital Signs BP 104/75   Pulse 97   Temp 98.1 F (36.7 C)   Resp 19   Ht 5\' 5"  (1.651 m)   Wt 70.8 kg   SpO2 100%   BMI 25.96 kg/m  Physical Exam Vitals and nursing note reviewed.   37 year old female, resting comfortably and in no acute distress. Vital signs are normal. Oxygen saturation is 100%, which is normal. Head is normocephalic and atraumatic. PERRLA, EOMI. Oropharynx is clear. Neck is nontender and supple without adenopathy or JVD. Back is nontender and there is no CVA tenderness. Lungs are clear without rales, wheezes, or rhonchi. Chest is nontender. Heart has regular rate and rhythm without murmur. Abdomen is soft, flat, with tenderness in the epigastric area extending to the right of the midline with +/- Murphy sign. Extremities have no cyanosis or edema, full range of motion is present. Skin is warm and dry without rash. Neurologic: Mental status is normal, cranial nerves are intact, moves all extremities equally.  ED Results / Procedures / Treatments   Labs (all labs ordered are listed, but only abnormal results are displayed) Labs Reviewed  BASIC METABOLIC PANEL - Abnormal; Notable for the following components:      Result Value   Potassium 3.3 (*)    Glucose, Bld 125 (*)    All other components within normal limits  CBC -  Abnormal; Notable for the following components:   WBC 11.6 (*)    All other components within normal limits  HCG, SERUM, QUALITATIVE  LIPASE, BLOOD  TROPONIN I (HIGH SENSITIVITY)    EKG EKG Interpretation Date/Time:  Tuesday March 13 2023 02:42:57 EDT Ventricular Rate:  98 PR Interval:  126 QRS Duration:  82 QT Interval:  344 QTC Calculation: 439 R Axis:   39  Text Interpretation: Normal sinus rhythm with sinus arrhythmia Otherwise within normal limits When compared with ECG of 09-Sep-2018 19:08, No significant change was found Confirmed by Dione Booze ([id number]) on 03/13/2023 4:16:31  AM  Radiology DG Chest 2 View  Result Date: 03/13/2023 CLINICAL DATA:  cp EXAM: CHEST - 2 VIEW COMPARISON:  Chest x-ray 08/04/2022 FINDINGS: The heart and mediastinal contours are within normal limits. No focal consolidation. No pulmonary edema. No pleural effusion. No pneumothorax. No acute osseous abnormality. IMPRESSION: No active cardiopulmonary disease. Electronically Signed   By: Tish Frederickson M.D.   On: 03/13/2023 03:30    Procedures Procedures  Cardiac monitor shows normal sinus rhythm, per my interpretation.  Medications Ordered in ED Medications  potassium chloride SA (KLOR-CON M) CR tablet 40 mEq (has no administration in time range)  alum & mag hydroxide-simeth (MAALOX/MYLANTA) 200-200-20 MG/5ML suspension 30 mL (30 mLs Oral Given 03/13/23 0417)  pantoprazole (PROTONIX) injection 40 mg (40 mg Intravenous Given 03/13/23 0417)    ED Course/ Medical Decision Making/ A&P                                 Medical Decision Making Amount and/or Complexity of Data Reviewed Labs: ordered. Radiology: ordered.  Risk OTC drugs. Prescription drug management.   Epigastric and right upper quadrant pain concerning for biliary colic.  Differential diagnosis also includes peptic ulcer disease, gastritis, GERD, diverticulitis, pancreatitis.  The differential includes conditions with significant risk for morbidity and complications.  I have reviewed and interpreted her electrocardiogram, and my interpretation is sinus arrhythmia and otherwise normal ECG.  Chest x-ray shows no acute cardiopulmonary process.  Have independently viewed the images, and agree with the radiologist's interpretation.  I reviewed her laboratory tests and my interpretation is elevated random glucose level, mild hypokalemia, normal lipase and troponin, mild leukocytosis which is nonspecific.  I have added hepatic function panel with results pending.  I have ordered a right upper quadrant ultrasound to evaluate for possible  cholelithiasis.  I have ordered a dose of an antacid and intravenous pantoprazole.  Hepatic function panel is normal, repeat troponin unchanged.  Right upper quadrant ultrasound shows no evidence of cholelithiasis.  Have independently reviewed the images, and agree with the radiologist's interpretation.  She noted moderate improvement with above-noted treatment.  I have ordered a dose of oral potassium.  I am discharging her with a prescription for pantoprazole and advised her to use over-the-counter antacids as needed.  Follow-up with PCP as needed.  Final Clinical Impression(s) / ED Diagnoses Final diagnoses:  Epigastric pain  Elevated random blood glucose level  Hypokalemia    Rx / DC Orders ED Discharge Orders          Ordered    pantoprazole (PROTONIX) 40 MG tablet  Daily        03/13/23 0618              Dione Booze, MD 03/13/23 [phone]

## 2023-03-13 NOTE — ED Triage Notes (Signed)
Pt reports she woke up with upper abdominal pain, goes up through her chest around 0145 this morning. Denies back pain. She took Pepto Bismol for indigestion without relief. "Sharp, stabbing pain". Diarrhea earlier today.

## 2023-03-13 NOTE — Discharge Instructions (Signed)
Take antacids as needed.  Return if symptoms are getting worse.

## 2023-03-16 ENCOUNTER — Other Ambulatory Visit (HOSPITAL_COMMUNITY): Payer: Self-pay

## 2023-04-11 ENCOUNTER — Telehealth: Payer: Self-pay

## 2023-04-11 NOTE — Telephone Encounter (Signed)
Ok to change to future

## 2023-04-11 NOTE — Addendum Note (Signed)
Addended by: Levonne Lapping on: 04/11/2023 10:06 AM   Modules accepted: Orders

## 2023-04-11 NOTE — Telephone Encounter (Signed)
I have changed to future

## 2023-04-12 ENCOUNTER — Other Ambulatory Visit (INDEPENDENT_AMBULATORY_CARE_PROVIDER_SITE_OTHER): Payer: Commercial Managed Care - PPO

## 2023-04-12 DIAGNOSIS — Z0000 Encounter for general adult medical examination without abnormal findings: Secondary | ICD-10-CM | POA: Diagnosis not present

## 2023-04-12 LAB — COMPREHENSIVE METABOLIC PANEL
ALT: 8 U/L (ref 0–35)
AST: 15 U/L (ref 0–37)
Albumin: 4.4 g/dL (ref 3.5–5.2)
Alkaline Phosphatase: 77 U/L (ref 39–117)
BUN: 9 mg/dL (ref 6–23)
CO2: 29 meq/L (ref 19–32)
Calcium: 9.3 mg/dL (ref 8.4–10.5)
Chloride: 102 meq/L (ref 96–112)
Creatinine, Ser: 0.73 mg/dL (ref 0.40–1.20)
GFR: 105.92 mL/min (ref >60.00)
Glucose, Bld: 72 mg/dL (ref 70–99)
Potassium: 3.8 meq/L (ref 3.5–5.1)
Sodium: 137 meq/L (ref 135–145)
Total Bilirubin: 0.5 mg/dL (ref 0.2–1.2)
Total Protein: 7.8 g/dL (ref 6.0–8.3)

## 2023-04-12 LAB — VITAMIN B12: Vitamin B-12: 361 pg/mL (ref 211–911)

## 2023-04-12 LAB — VITAMIN D 25 HYDROXY (VIT D DEFICIENCY, FRACTURES): VITD: 28.09 ng/mL — ABNORMAL LOW (ref 30.00–100.00)

## 2023-04-12 LAB — TSH: TSH: 0.44 u[IU]/mL (ref 0.35–5.50)

## 2023-04-17 ENCOUNTER — Encounter: Payer: Self-pay | Admitting: Family Medicine

## 2023-04-17 ENCOUNTER — Ambulatory Visit (INDEPENDENT_AMBULATORY_CARE_PROVIDER_SITE_OTHER): Payer: Commercial Managed Care - PPO | Admitting: Family Medicine

## 2023-04-17 VITALS — BP 112/78 | HR 73 | Temp 97.6°F | Ht 65.0 in

## 2023-04-17 DIAGNOSIS — N6320 Unspecified lump in the left breast, unspecified quadrant: Secondary | ICD-10-CM | POA: Diagnosis not present

## 2023-04-17 DIAGNOSIS — N644 Mastodynia: Secondary | ICD-10-CM | POA: Diagnosis not present

## 2023-04-17 NOTE — Progress Notes (Signed)
Subjective:     Patient ID: Sherry Sheppard, female    DOB: Apr 15, 1986, 37 y.o.   MRN: [id number]  Chief Complaint  Patient presents with   Breast Pain    Bilateral pain, feels something in left breast    HPI   History of Present Illness          C/o 1-2 wk hx of bilateral breast pain with mass in left breast that is TTP. Denies having mammogram or [place] in past. Denies family hx of breast cancer.   No fever, chills, weight loss, night sweats, dizziness, headache, chest pain, abdominal pain, N/V/D.     Health Maintenance Due  Topic Date Due   HPV VACCINES (2 - 3-dose series) 07/27/2011   INFLUENZA VACCINE  02/08/2023   COVID-19 Vaccine (3 - 2023-24 season) 03/11/2023    Past Medical History:  Diagnosis Date   Allergic rhinitis    AVM (arteriovenous malformation) brain    Migraines    Seizures (HCC)    last event 08/24/12   Vaginal Pap smear, abnormal     Past Surgical History:  Procedure Laterality Date   BRAIN AVM REPAIR  12/2012   gamma radio knife @ [place]   CESAREAN SECTION  2009   CESAREAN SECTION N/A 08/24/2012   Procedure: CESAREAN SECTION;  Surgeon: Kathreen Cosier, MD;  Location: [facility] OR[facility];  Service: Obstetrics;  Laterality: N/A;   CESAREAN SECTION N/A 11/10/2018   Procedure: CESAREAN SECTION;  Surgeon: Hermina Staggers, MD;  Location: [facility] ORS;  Service: Obstetrics;  Laterality: N/A;   DILATION AND EVACUATION  09/20/2011   Procedure: DILATATION AND EVACUATION;  Surgeon: Brock Bad, MD;  Location: [facility] ORS;  Service: Gynecology;  Laterality: N/A;   TOOTH EXTRACTION  2012    Family History  Problem Relation Age of Onset   Hyperlipidemia Mother    Diabetes Mother    Hypertension Father    Lung cancer Maternal Grandmother 48   Hypertension Maternal Aunt    Diabetes Cousin     Social History   Socioeconomic History   Marital status: Married    Spouse name: Not on file   Number of children: 2   Years of education: Not on file   Highest  education level: Not on file  Occupational History   Occupation: Optometrist     Employer: [employer]  Tobacco Use   Smoking status: Never   Smokeless tobacco: Never  Vaping Use   Vaping status: Never Used  Substance and Sexual Activity   Alcohol use: Yes    Comment: occ   Drug use: No   Sexual activity: Yes    Partners: Male    Birth control/protection: I.U.D.  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   Single, lives with sons   Social Determinants of Health   Financial Resource Strain: Low Risk  (05/01/2018)   Overall Financial Resource Strain (CARDIA)    Difficulty of Paying Living Expenses: Not hard at all  Food Insecurity: No Food Insecurity (05/01/2018)   Hunger Vital Sign    Worried About Running Out of Food in the Last Year: Never true    Ran Out of Food in the Last Year: Never true  Transportation Needs: No Transportation Needs (05/01/2018)   PRAPARE - Administrator, Civil Service (Medical): No    Lack of Transportation (  Non-Medical): No  Physical Activity: Insufficiently Active (05/01/2018)   Exercise Vital Sign    Days of Exercise per Week: 1 day    Minutes of Exercise per Session: 30 min  Stress: No Stress Concern Present (05/01/2018)   [employer] of Occupational Health - Occupational Stress Questionnaire    Feeling of Stress : Not at all  Social Connections: Unknown (05/01/2018)   Social Connection and Isolation Panel [NHANES]    Frequency of Communication with Friends and Family: More than three times a week    Frequency of Social Gatherings with Friends and Family: Three times a week    Attends Religious Services: More than 4 times per year    Active Member of Clubs or Organizations: No    Attends Banker Meetings: Never    Marital Status: Not on file  Intimate Partner Violence: Not At Risk (05/01/2018)   Humiliation, Afraid,  Rape, and Kick questionnaire    Fear of Current or Ex-Partner: No    Emotionally Abused: No    Physically Abused: No    Sexually Abused: No    Outpatient Medications Prior to Visit  Medication Sig Dispense Refill   cyanocobalamin (VITAMIN B12) 1000 MCG tablet Take 1 tablet (1,000 mcg total) by mouth daily. 90 tablet 3   fluticasone (FLONASE) 50 MCG/ACT nasal spray Place 2 sprays into both nostrils daily. 16 g 6   hydrOXYzine (ATARAX) 10 MG tablet Take 1-2 tablets (10-20 mg total) by mouth 3 (three) times daily as needed for anxiety. 20 tablet 0   ibuprofen (ADVIL) 800 MG tablet Take 1 tablet (800 mg total) by mouth every 8 (eight) hours as needed. 30 tablet 5   levonorgestrel (MIRENA) 20 MCG/24HR IUD 1 each by Intrauterine route once.     loratadine (CLARITIN) 10 MG tablet Take 1 tablet (10 mg total) by mouth daily. 30 tablet 11   pantoprazole (PROTONIX) 40 MG tablet Take 1 tablet (40 mg total) by mouth daily. 30 tablet 0   Vitamin D, Ergocalciferol, (DRISDOL) 1.25 MG (50000 UNIT) CAPS capsule Take 1 capsule by mouth every 7 days. 12 capsule 0   No facility-administered medications prior to visit.    Allergies  Allergen Reactions   Banana Shortness Of Breath   Gadobenate Other (See Comments)    The patient states a previous history of nausea with MRI contrast.  I administered a half dose contrast by hand very slowly and she had no issues.   Other Nausea And Vomiting    Contrast dye   Procardia [Nifedipine] Palpitations    ROS     Objective:    Physical Exam Exam conducted with a chaperone present.  Constitutional:      General: She is not in acute distress.    Appearance: She is not ill-appearing.  Eyes:     Extraocular Movements: Extraocular movements intact.     Conjunctiva/sclera: Conjunctivae normal.  Cardiovascular:     Rate and Rhythm: Normal rate.  Pulmonary:     Effort: Pulmonary effort is normal.  Chest:  Breasts:    Right: Tenderness present. No inverted  nipple, nipple discharge or skin change.     Left: Mass and tenderness present. No inverted nipple, nipple discharge or skin change.       Comments: Left breast with area of firmness and TTP at 12 o'clock. Bilateral breast tenderness at 6 o'clock Musculoskeletal:     Cervical back: Normal range of motion and neck supple.  Lymphadenopathy:  Upper Body:     Right upper body: No supraclavicular or axillary adenopathy.     Left upper body: No supraclavicular or axillary adenopathy.  Skin:    General: Skin is warm and dry.  Neurological:     General: No focal deficit present.     Mental Status: She is alert and oriented to person, place, and time.  Psychiatric:        Mood and Affect: Mood normal.        Behavior: Behavior normal.        Thought Content: Thought content normal.      BP 112/78 (BP Location: Left Arm, Patient Position: Sitting, Cuff Size: Normal)   Pulse 73   Temp 97.6 F (36.4 C) (Temporal)   Ht 5\' 5"  (1.651 m)   SpO2 100%   BMI 25.96 kg/m  Wt Readings from Last 3 Encounters:  03/13/23 156 lb (70.8 kg)  03/08/23 156 lb 8 oz (71 kg)  02/27/23 160 lb 6.4 oz (72.8 kg)       Assessment & Plan:   Problem List Items Addressed This Visit   None Visit Diagnoses     Mass of left breast, unspecified quadrant    -  Primary   Relevant Orders   US BREAST COMPLETE UNI LEFT INC AXILLA   MM 3D DIAGNOSTIC MAMMOGRAM BILATERAL BREAST   Breast pain       Relevant Orders   US BREAST COMPLETE UNI LEFT INC AXILLA   MM 3D DIAGNOSTIC MAMMOGRAM BILATERAL BREAST      Breast US and diagnostic mammogram ordered. Follow up pending results. Discussed seeing OB/GYN annually for breast and pelvic exams.   I am having Sherry Sheppard "Don" maintain her levonorgestrel, fluticasone, ibuprofen, loratadine, cyanocobalamin, Vitamin D (Ergocalciferol), hydrOXYzine, and pantoprazole.  No orders of the defined types were placed in this encounter.

## 2023-05-01 ENCOUNTER — Ambulatory Visit
Admission: RE | Admit: 2023-05-01 | Discharge: 2023-05-01 | Disposition: A | Discharge status: Home / Self Care | Payer: Commercial Managed Care - PPO | Source: Ambulatory Visit | Attending: Family Medicine | Admitting: Family Medicine

## 2023-05-01 ENCOUNTER — Other Ambulatory Visit: Payer: Self-pay | Admitting: Family Medicine

## 2023-05-01 DIAGNOSIS — N644 Mastodynia: Secondary | ICD-10-CM

## 2023-05-01 DIAGNOSIS — N6320 Unspecified lump in the left breast, unspecified quadrant: Secondary | ICD-10-CM | POA: Diagnosis not present

## 2023-05-01 DIAGNOSIS — R928 Other abnormal and inconclusive findings on diagnostic imaging of breast: Secondary | ICD-10-CM

## 2023-09-07 DIAGNOSIS — H18603 Keratoconus, unspecified, bilateral: Secondary | ICD-10-CM | POA: Diagnosis not present

## 2023-09-28 ENCOUNTER — Ambulatory Visit: Admitting: Internal Medicine

## 2023-09-28 ENCOUNTER — Other Ambulatory Visit (HOSPITAL_COMMUNITY): Payer: Self-pay

## 2023-09-28 ENCOUNTER — Other Ambulatory Visit: Payer: Self-pay

## 2023-09-28 VITALS — BP 110/84 | HR 101 | Temp 98.9°F | Ht 65.0 in | Wt 159.0 lb

## 2023-09-28 DIAGNOSIS — J069 Acute upper respiratory infection, unspecified: Secondary | ICD-10-CM | POA: Diagnosis not present

## 2023-09-28 MED ORDER — HYDROCODONE BIT-HOMATROP MBR 5-1.5 MG/5ML PO SOLN
5.0000 mL | Freq: Three times a day (TID) | ORAL | 0 refills | Status: AC | PRN
  Filled 2023-09-28: qty 120, 8d supply, fill #0

## 2023-09-28 NOTE — Patient Instructions (Addendum)
       Medications changes include :   hycodan cough syrup     Return if symptoms worsen or fail to improve.

## 2023-09-28 NOTE — Progress Notes (Signed)
 Subjective:    Patient ID: Sherry Sheppard, female    DOB: June 18, 1986, 38 y.o.   MRN: [id number]      HPI Joel is here for  Chief Complaint  Patient presents with   Cough    Cough and congestion, headache, (started yesterday) heavy chest feeling like she can't breath and some burning     Symptoms started two days ago. Her chest felt tight and heavy.  Inetta Fermo she started having a cough from a tickle in her throat.  She has a headaches, can feel mucus moving in chest, and her and her airways burn.  Her chest has never felt this way so she was concerned.  She does state some fatigue, ear pain.  Her son is also sick with similar symptoms.  He tested negative for COVID, strep and flu.  Was thought that he had a viral illness.  She does have seasonal allergies, but this feels different.  Has been taking tylenol and advil.      Medications and allergies reviewed with patient and updated if appropriate.  Current Outpatient Medications on File Prior to Visit  Medication Sig Dispense Refill   cyanocobalamin (VITAMIN B12) 1000 MCG tablet Take 1 tablet (1,000 mcg total) by mouth daily. 90 tablet 3   fluticasone (FLONASE) 50 MCG/ACT nasal spray Place 2 sprays into both nostrils daily. 16 g 6   hydrOXYzine (ATARAX) 10 MG tablet Take 1-2 tablets (10-20 mg total) by mouth 3 (three) times daily as needed for anxiety. 20 tablet 0   ibuprofen (ADVIL) 800 MG tablet Take 1 tablet (800 mg total) by mouth every 8 (eight) hours as needed. 30 tablet 5   levonorgestrel (MIRENA) 20 MCG/24HR IUD 1 each by Intrauterine route once.     loratadine (CLARITIN) 10 MG tablet Take 1 tablet (10 mg total) by mouth daily. 30 tablet 11   pantoprazole (PROTONIX) 40 MG tablet Take 1 tablet (40 mg total) by mouth daily. 30 tablet 0   Vitamin D, Ergocalciferol, (DRISDOL) 1.25 MG (50000 UNIT) CAPS capsule Take 1 capsule by mouth every 7 days. 12 capsule 0   No current facility-administered medications on file  prior to visit.    Review of Systems  Constitutional:  Positive for fatigue. Negative for appetite change, chills and fever.  HENT:  Positive for ear pain. Negative for congestion, postnasal drip, sinus pressure, sinus pain and sore throat.   Respiratory:  Positive for cough and chest tightness. Negative for shortness of breath and wheezing.   Gastrointestinal:  Negative for diarrhea and nausea.  Musculoskeletal:  Negative for myalgias.  Neurological:  Positive for headaches. Negative for dizziness and light-headedness.       Objective:   Vitals:   09/28/23 0754  BP: 110/84  Pulse: (!) 101  Temp: 98.9 F (37.2 C)  SpO2: 100%   BP Readings from Last 3 Encounters:  09/28/23 110/84  04/17/23 112/78  03/13/23 100/68   Wt Readings from Last 3 Encounters:  09/28/23 159 lb (72.1 kg)  03/13/23 156 lb (70.8 kg)  03/08/23 156 lb 8 oz (71 kg)   Body mass index is 26.46 kg/m.    Physical Exam Constitutional:      General: She is not in acute distress.    Appearance: Normal appearance. She is not ill-appearing.  HENT:     Head: Normocephalic and atraumatic.     Right Ear: Tympanic membrane, ear canal and external ear normal.     Left Ear: Tympanic membrane,  ear canal and external ear normal.     Mouth/Throat:     Mouth: Mucous membranes are moist.     Pharynx: No oropharyngeal exudate or posterior oropharyngeal erythema.  Eyes:     Conjunctiva/sclera: Conjunctivae normal.  Cardiovascular:     Rate and Rhythm: Normal rate and regular rhythm.  Pulmonary:     Effort: Pulmonary effort is normal. No respiratory distress.     Breath sounds: Normal breath sounds. No wheezing or rales.  Musculoskeletal:     Cervical back: Neck supple. No tenderness.  Lymphadenopathy:     Cervical: No cervical adenopathy.  Skin:    General: Skin is warm and dry.  Neurological:     Mental Status: She is alert.         COVID and flu test here negative.   Assessment & Plan:    Viral  URI: Acute Symptoms likely viral in nature Rapid COVID and flu tests are negative Hycodan cough syrup for nighttime Continue symptomatic treatment with over-the-counter cold medications, Tylenol/ibuprofen Increase rest and fluids Call if symptoms worsen or do not improve

## 2023-10-18 ENCOUNTER — Other Ambulatory Visit: Payer: Self-pay

## 2023-10-18 ENCOUNTER — Other Ambulatory Visit (HOSPITAL_COMMUNITY): Payer: Self-pay

## 2023-10-18 ENCOUNTER — Encounter: Payer: Self-pay | Admitting: Dermatology

## 2023-10-18 ENCOUNTER — Ambulatory Visit: Payer: Commercial Managed Care - PPO | Admitting: Dermatology

## 2023-10-18 VITALS — BP 103/68 | HR 74

## 2023-10-18 DIAGNOSIS — L700 Acne vulgaris: Secondary | ICD-10-CM

## 2023-10-18 DIAGNOSIS — L810 Postinflammatory hyperpigmentation: Secondary | ICD-10-CM | POA: Diagnosis not present

## 2023-10-18 MED ORDER — CLINDAMYCIN PHOSPHATE 1 % EX SWAB
CUTANEOUS | 3 refills | Status: DC
  Filled 2023-10-18: qty 60, 30d supply, fill #0
  Filled 2024-01-01 (×2): qty 60, 30d supply, fill #1

## 2023-10-18 MED ORDER — ALTRENO 0.05 % EX LOTN: TOPICAL_LOTION | CUTANEOUS | 3 refills | Status: DC

## 2023-10-18 NOTE — Progress Notes (Signed)
   New Patient Visit   Subjective  Sherry Sheppard is a 38 y.o. female who presents for the following: Acne and hyperpigmentation. C/O adult acne. Dur: since 2009. Did not have acne as a teen. Started after birth of first child. Has an OTC skin regimen: retinol, toner, CeraVe moisturizer/sunscreen/face wash, Vit. C, hyaluronic acid. States she is not consistent with this regimen. Gets clogged pores and cystic papules on face and shoulders.   The following portions of the chart were reviewed this encounter and updated as appropriate: medications, allergies, medical history  Review of Systems:  No other skin or systemic complaints except as noted in HPI or Assessment and Plan.  Objective  Well appearing patient in no apparent distress; mood and affect are within normal limits.  A focused examination was performed of the following areas: Face, shoulders, back  Relevant exam findings are noted in the Assessment and Plan.             Assessment & Plan   ACNE VULGARIS  - Assessment: Patient presents with acne and post-inflammatory hyperpigmentation. Previous over-the-counter treatments have shown limited efficacy due to inconsistent use. Prescription-strength treatments are warranted for faster results. A persistent spot is noted, which appears to be an irritated acne bump. No oral medications are deemed necessary at this time based on the current skin examination.  Chronic and persistent condition with duration or expected duration over one year. Condition is bothersome/symptomatic for patient. Currently flared.   Treatment Plan: Continue CeraVe BPO acne wash every morning.  Start Clindamycin swabs each morning  Start Altreno pea sized amount to entire face 2 nights per week for first 2 weeks, then increase to M, W, F Wash face with gentle cleanser at bedtime. CeraVe cream to foam.  Apply hyaluronic acid then apply Altreno.   Stop retinol.   Avoid picking at acne.       POST-INFLAMMATORY HYPERPIGMENTATION (PIH) Exam: hyperpigmented macules and papules at face   This is a benign condition that comes from having previous inflammation in the skin and will fade with time over months to sometimes years. Recommend daily sun protection including sunscreen SPF 30+ to sun-exposed areas. - Recommend treating any itchy or red areas on the skin quickly to prevent new areas of PIH. Treating with prescription medicines such as hydroquinone may help fade dark spots faster.    Treatment Plan:    Eucerin Radiant Tone for pigmentation apply at bedtime. Recommend using Isntree sunscreen each morning    Return in about 4 months (around 02/17/2024) for Acne Follow Up.  I, Jill Parcell, CMA, am acting as scribe for [employer], DO.   Documentation: I have reviewed the above documentation for accuracy and completeness, and I agree with the above.  Louana Roup, DO

## 2023-10-18 NOTE — Patient Instructions (Addendum)
 Hello Sherry Sheppard,  Thank you for visiting today. Here is a summary of the key instructions:  - Morning Routine:   - Wash face with CeraVe benzoyl peroxide wash   - Apply clindamycin swabs   - Use Eucerin Radiant Tone moisturizer   - Apply Isntree sunscreen  - Evening Routine:   - Wash face with CeraVe cream to foam cleanser   - Apply hyaluronic acid by [employer]   - On Monday and Wednesday nights:     - Apply a pea-sized amount of Altreno     - Follow with Eucerin Radiant Tone   - On other nights:     - Apply Eucerin Radiant Tone only  - Medications:   - Altreno (tretinoin):     - Use 2 nights a week for the first 2 weeks     - After 1 month, increase to 3 nights a week (Monday, Wednesday, Friday)     - Apply a pea-sized amount  - Skincare Tips:   - Be consistent with your skincare routine   - Link your skincare routine with brushing your teeth   - Do not pick at acne spots   - Use acne patches if needed  - Follow-up: Return for a follow-up appointment in 4 months  We look forward to seeing the positive changes in your next visit. If you have any questions or concerns before then, please do not hesitate to contact our office.  Warm regards,  Dr. [facility]        Recommended Sunscreen       Important Information  Due to recent changes in healthcare laws, you may see results of your pathology and/or laboratory studies on MyChart before the doctors have had a chance to review them. We understand that in some cases there may be results that are confusing or concerning to you. Please understand that not all results are received at the same time and often the doctors may need to interpret multiple results in order to provide you with the best plan of care or course of treatment. Therefore, we ask that you please give [place] 2 business days to thoroughly review all your results before contacting the office for clarification. Should we see a critical  lab result, you will be contacted sooner.   If You Need Anything After Your Visit  If you have any questions or concerns for your doctor, please call our main line at [phone] If no one answers, please leave a voicemail as directed and we will return your call as soon as possible. Messages left after 4 pm will be answered the following business day.   You may also send [place] a message via MyChart. We typically respond to MyChart messages within 1-2 business days.  For prescription refills, please ask your pharmacy to contact our office. Our fax number is [phone].  If you have an urgent issue when the clinic is closed that cannot wait until the next business day, you can page your doctor at the number below.    Please note that while we do our best to be available for urgent issues outside of office hours, we are not available 24/7.   If you have an urgent issue and are unable to reach [place], you may choose to seek medical care at your doctor's office, retail clinic, urgent care center, or emergency room.  If you have a medical emergency, please immediately call 911 or go to the emergency department. In  the event of inclement weather, please call our main line at [phone] for an update on the status of any delays or closures.  Dermatology Medication Tips: Please keep the boxes that topical medications come in in order to help keep track of the instructions about where and how to use these. Pharmacies typically print the medication instructions only on the boxes and not directly on the medication tubes.   If your medication is too expensive, please contact our office at [phone] or send [place] a message through MyChart.   We are unable to tell what your co-pay for medications will be in advance as this is different depending on your insurance coverage. However, we may be able to find a substitute medication at lower cost or fill out paperwork to get insurance to cover a needed  medication.   If a prior authorization is required to get your medication covered by your insurance company, please allow [place] 1-2 business days to complete this process.  Drug prices often vary depending on where the prescription is filled and some pharmacies may offer cheaper prices.  The website www.goodrx.com contains coupons for medications through different pharmacies. The prices here do not account for what the cost may be with help from insurance (it may be cheaper with your insurance), but the website can give you the price if you did not use any insurance.  - You can print the associated coupon and take it with your prescription to the pharmacy.  - You may also stop by our office during regular business hours and pick up a GoodRx coupon card.  - If you need your prescription sent electronically to a different pharmacy, notify our office through [facility] or by phone at [phone]

## 2023-10-22 MED ORDER — ALTRENO 0.05 % EX LOTN: TOPICAL_LOTION | CUTANEOUS | 3 refills | Status: AC

## 2023-10-27 ENCOUNTER — Encounter: Payer: Self-pay | Admitting: Dermatology

## 2023-10-29 NOTE — Telephone Encounter (Signed)
 She only needs to use the Eucerin Radiant tone serum.  She can apply morning and night  She can use their sunscreen or any other sunscreen she chooses in the morning.  -Dr. Baker Bon

## 2024-01-01 ENCOUNTER — Other Ambulatory Visit (HOSPITAL_COMMUNITY): Payer: Self-pay

## 2024-01-04 ENCOUNTER — Other Ambulatory Visit (HOSPITAL_COMMUNITY): Payer: Self-pay

## 2024-01-14 ENCOUNTER — Other Ambulatory Visit (HOSPITAL_COMMUNITY): Payer: Self-pay | Admitting: Emergency Medicine

## 2024-01-14 ENCOUNTER — Other Ambulatory Visit (HOSPITAL_COMMUNITY): Payer: Self-pay

## 2024-01-21 ENCOUNTER — Other Ambulatory Visit (HOSPITAL_BASED_OUTPATIENT_CLINIC_OR_DEPARTMENT_OTHER): Payer: Self-pay

## 2024-01-21 ENCOUNTER — Other Ambulatory Visit: Payer: Self-pay | Admitting: Internal Medicine

## 2024-01-21 MED ORDER — PANTOPRAZOLE SODIUM 40 MG PO TBEC
40.0000 mg | DELAYED_RELEASE_TABLET | Freq: Every day | ORAL | 3 refills | Status: AC
  Filled 2024-01-21: qty 90, 90d supply, fill #0

## 2024-01-21 NOTE — Telephone Encounter (Signed)
 Was prescribed by ED, this will be first fill w you.. ok to refill?

## 2024-01-31 ENCOUNTER — Other Ambulatory Visit (HOSPITAL_BASED_OUTPATIENT_CLINIC_OR_DEPARTMENT_OTHER): Payer: Self-pay

## 2024-02-19 ENCOUNTER — Ambulatory Visit: Admitting: Dermatology

## 2024-02-19 ENCOUNTER — Encounter: Payer: Self-pay | Admitting: Dermatology

## 2024-02-19 ENCOUNTER — Other Ambulatory Visit: Payer: Self-pay

## 2024-02-19 ENCOUNTER — Other Ambulatory Visit (HOSPITAL_COMMUNITY): Payer: Self-pay

## 2024-02-19 VITALS — BP 114/75

## 2024-02-19 DIAGNOSIS — L810 Postinflammatory hyperpigmentation: Secondary | ICD-10-CM | POA: Diagnosis not present

## 2024-02-19 DIAGNOSIS — L700 Acne vulgaris: Secondary | ICD-10-CM

## 2024-02-19 MED ORDER — CLINDAMYCIN PHOSPHATE 1 % EX SWAB
CUTANEOUS | 3 refills | Status: AC
  Filled 2024-02-19: qty 60, 30d supply, fill #0
  Filled 2024-05-27: qty 60, 30d supply, fill #1
  Filled 2024-07-28: qty 60, 60d supply, fill #2
  Filled 2024-07-28: qty 60, 30d supply, fill #2

## 2024-02-19 NOTE — Progress Notes (Signed)
   Follow-Up Visit   Subjective  Sherry Sheppard is a 38 y.o. female established patient who presents for FOLLOW UP on the diagnoses listed below:  Patient was last evaluated on 10/18/23.    Acne: Prescribed Clindamycin  swabs & Altreno . Recommended CeraVe BPO wash & CeraVe cream to foam - she is using both and it is agreeing with her. Patient reports sxs are better - she is seeing improvement in tone and PIH.    The following portions of the chart were reviewed this encounter and updated as appropriate: medications, allergies, medical history  Review of Systems:  No other skin or systemic complaints except as noted in HPI or Assessment and Plan.  Objective  Well appearing patient in no apparent distress; mood and affect are within normal limits.   A focused examination was performed of the following areas: face   Relevant exam findings are noted in the Assessment and Plan.          Assessment & Plan   ACNE VULGARIS and Post Inflammatory Hyperpigmentation  Exam: acne and PIH both greatly improved.     improved  - Assessment: Patient currently using Altreno  (tretinoin  lotion) 3 nights per week with good response. Skin appearance has improved with minimal new breakouts, only 1-2 occurrences since last visit. Some dryness noted in one area but not bothersome. The combination of Altreno , clindamycin , and Excedrin Radiant Tone effectively managing acne and fading dark spots. No deep or hormonal acne observed on jawline.  - Plan:    Increase Altreno  use to 5 nights per week (Monday through Friday)    Apply pea-sized amount, avoiding dry areas or applying thick moisturizer to those areas first    Continue Eucerin Radiant Tone twice daily (morning and night)    Continue clindamycin  swabs    Prescribe 58-month supply with 3 refills    Adjust Altreno  frequency based on skin dryness: reduce to 3 nights/week in November/December if needed, further reduce to 2 nights/week in  January/February if necessary    Maintain at least 1-2 nights/week consistently    Recommend heavy-duty moisturizer for nighttime use    Provide samples of Avene Tolerance and Avene Cicalfate  Follow-up in spring (approximately 6-8 months) to reassess and consider switching to Arazlo.    Return in about 8 months (around 10/19/2024) for ACNE.   Documentation: I have reviewed the above documentation for accuracy and completeness, and I agree with the above.  I, Shirron Maranda, CMA, am acting as scribe for [employer], DO.   Delon Lenis, DO

## 2024-02-19 NOTE — Patient Instructions (Addendum)
 Date: Tue Feb 19 2024  Hello Sherry Sheppard,  Thank you for visiting today. Here is a summary of the key instructions:  - Medications:   - Use Altreno  5 nights a week (Monday through Friday)   - Apply a pea-sized amount of Altreno , avoiding sensitive areas   - Use clindamycin  swabs as directed (36-month supply with 3 refills provided)   - Continue using Eucerin Radiant Tone morning and night  - Skin Care:   - Morning: Wash face, apply clindamycin , Eucerin Radiant Tone, moisturizer, and sunscreen   - Night with Altreno : Wash face, apply Eucerin Radiant Tone, Altreno , then heavy-duty moisturizer   - Night without Altreno : Wash face, apply Eucerin Radiant Tone, then heavy-duty moisturizer   - Try Avene Tolerance or Cicalfate moisturizer (samples provided)   - Purchase from [place], [employer], or online if you like them  - Follow-up:   - Next appointment in spring 2026  - Other Instructions:   - If skin becomes dry or tight, pause Altreno  for a few days   - Reduce Altreno  use to 3 or 2 nights a week in cold months (December, January)   - Use a heavier moisturizer in winter months  Please reach out if you have any questions or concerns.  Warm regards,  Dr. [facility]  Important Information  Due to recent changes in healthcare laws, you may see results of your pathology and/or laboratory studies on MyChart before the doctors have had a chance to review them. We understand that in some cases there may be results that are confusing or concerning to you. Please understand that not all results are received at the same time and often the doctors may need to interpret multiple results in order to provide you with the best plan of care or course of treatment. Therefore, we ask that you please give us  2 business days to thoroughly review all your results before contacting the office for clarification. Should we see a critical lab result, you will be contacted sooner.   If You Need Anything  After Your Visit  If you have any questions or concerns for your doctor, please call our main line at [phone] If no one answers, please leave a voicemail as directed and we will return your call as soon as possible. Messages left after 4 pm will be answered the following business day.   You may also send us  a message via MyChart. We typically respond to MyChart messages within 1-2 business days.  For prescription refills, please ask your pharmacy to contact our office. Our fax number is [phone].  If you have an urgent issue when the clinic is closed that cannot wait until the next business day, you can page your doctor at the number below.    Please note that while we do our best to be available for urgent issues outside of office hours, we are not available 24/7.   If you have an urgent issue and are unable to reach us , you may choose to seek medical care at your doctor's office, retail clinic, urgent care center, or emergency room.  If you have a medical emergency, please immediately call 911 or go to the emergency department. In the event of inclement weather, please call our main line at [phone] for an update on the status of any delays or closures.  Dermatology Medication Tips: Please keep the boxes that topical medications come in in order to help keep track of the instructions about where and how to  use these. Pharmacies typically print the medication instructions only on the boxes and not directly on the medication tubes.   If your medication is too expensive, please contact our office at [phone] or send us  a message through MyChart.   We are unable to tell what your co-pay for medications will be in advance as this is different depending on your insurance coverage. However, we may be able to find a substitute medication at lower cost or fill out paperwork to get insurance to cover a needed medication.   If a prior authorization is required to get your medication  covered by your insurance company, please allow us  1-2 business days to complete this process.  Drug prices often vary depending on where the prescription is filled and some pharmacies may offer cheaper prices.  The website www.goodrx.com contains coupons for medications through different pharmacies. The prices here do not account for what the cost may be with help from insurance (it may be cheaper with your insurance), but the website can give you the price if you did not use any insurance.  - You can print the associated coupon and take it with your prescription to the pharmacy.  - You may also stop by our office during regular business hours and pick up a GoodRx coupon card.  - If you need your prescription sent electronically to a different pharmacy, notify our office through [facility] or by phone at [phone]

## 2024-03-05 ENCOUNTER — Other Ambulatory Visit: Payer: Self-pay

## 2024-03-05 ENCOUNTER — Other Ambulatory Visit (HOSPITAL_COMMUNITY): Payer: Self-pay

## 2024-03-05 ENCOUNTER — Telehealth: Admitting: Physician Assistant

## 2024-03-05 DIAGNOSIS — R3989 Other symptoms and signs involving the genitourinary system: Secondary | ICD-10-CM

## 2024-03-05 MED ORDER — CEPHALEXIN 500 MG PO CAPS
500.0000 mg | ORAL_CAPSULE | Freq: Two times a day (BID) | ORAL | 0 refills | Status: AC
  Filled 2024-03-05 (×2): qty 14, 7d supply, fill #0

## 2024-03-05 NOTE — Progress Notes (Signed)
 I have spent 5 minutes in review of e-visit questionnaire, review and updating patient chart, medical decision making and response to patient.   Elsie Velma Lunger, PA-C

## 2024-03-05 NOTE — Progress Notes (Signed)

## 2024-03-10 ENCOUNTER — Telehealth: Admitting: Physician Assistant

## 2024-03-10 ENCOUNTER — Other Ambulatory Visit (HOSPITAL_COMMUNITY): Payer: Self-pay

## 2024-03-10 DIAGNOSIS — B379 Candidiasis, unspecified: Secondary | ICD-10-CM

## 2024-03-10 MED ORDER — FLUCONAZOLE 150 MG PO TABS
150.0000 mg | ORAL_TABLET | ORAL | 0 refills | Status: DC | PRN
Start: 2024-03-10 — End: 2024-05-08
  Filled 2024-03-10: qty 2, 6d supply, fill #0

## 2024-03-10 NOTE — Progress Notes (Signed)
 E-Visit for Vaginal Symptoms  We are sorry that you are not feeling well. Here is how we plan to help! Based on what you shared with me it looks like you: May have a yeast vaginosis secondary to current antibiotic use.  Vaginosis is an inflammation of the vagina that can result in discharge, itching and pain. The cause is usually a change in the normal balance of vaginal bacteria or an infection. Vaginosis can also result from reduced estrogen levels after menopause.  The most common causes of vaginosis are:   Bacterial vaginosis which results from an overgrowth of one on several organisms that are normally present in your vagina.   Yeast infections which are caused by a naturally occurring fungus called candida.   Vaginal atrophy (atrophic vaginosis) which results from the thinning of the vagina from reduced estrogen levels after menopause.   Trichomoniasis which is caused by a parasite and is commonly transmitted by sexual intercourse.  Factors that increase your risk of developing vaginosis include: Medications, such as antibiotics and steroids Uncontrolled diabetes Use of hygiene products such as bubble bath, vaginal spray or vaginal deodorant Douching Wearing damp or tight-fitting clothing Using an intrauterine device (IUD) for birth control Hormonal changes, such as those associated with pregnancy, birth control pills or menopause Sexual activity Having a sexually transmitted infection  Your treatment plan is A single Diflucan  (fluconazole ) 150mg  tablet once.  I have electronically sent this prescription into the pharmacy that you have chosen. I am sending 2 tablets. Take one now and one once antibiotic is completed.   Be sure to take all of the medication as directed. Stop taking any medication if you develop a rash, tongue swelling or shortness of breath. Mothers who are breast feeding should consider pumping and discarding their breast milk while on these antibiotics. However,  there is no consensus that infant exposure at these doses would be harmful.  Remember that medication creams can weaken latex condoms. SABRA   HOME CARE:  Good hygiene may prevent some types of vaginosis from recurring and may relieve some symptoms:  Avoid baths, hot tubs and whirlpool spas. Rinse soap from your outer genital area after a shower, and dry the area well to prevent irritation. Don't use scented or harsh soaps, such as those with deodorant or antibacterial action. Avoid irritants. These include scented tampons and pads. Wipe from front to back after using the toilet. Doing so avoids spreading fecal bacteria to your vagina.  Other things that may help prevent vaginosis include:  Don't douche. Your vagina doesn't require cleansing other than normal bathing. Repetitive douching disrupts the normal organisms that reside in the vagina and can actually increase your risk of vaginal infection. Douching won't clear up a vaginal infection. Use a latex condom. Both female and female latex condoms may help you avoid infections spread by sexual contact. Wear cotton underwear. Also wear pantyhose with a cotton crotch. If you feel comfortable without it, skip wearing underwear to bed. Yeast thrives in [employer] Your symptoms should improve in the next day or two.  GET HELP RIGHT AWAY IF:  You have pain in your lower abdomen ( pelvic area or over your ovaries) You develop nausea or vomiting You develop a fever Your discharge changes or worsens You have persistent pain with intercourse You develop shortness of breath, a rapid pulse, or you faint.  These symptoms could be signs of problems or infections that need to be evaluated by a medical provider now.  MAKE  SURE YOU   Understand these instructions. Will watch your condition. Will get help right away if you are not doing well or get worse.  Thank you for choosing an e-visit.  Your e-visit answers were reviewed by a board  certified advanced clinical practitioner to complete your personal care plan. Depending upon the condition, your plan could have included both over the counter or prescription medications.  Please review your pharmacy choice. Make sure the pharmacy is open so you can pick up prescription now. If there is a problem, you may contact your provider through [employer] and have the prescription routed to another pharmacy.  Your safety is important to us . If you have drug allergies check your prescription carefully.   For the next 24 hours you can use MyChart to ask questions about today's visit, request a non-urgent call back, or ask for a work or school excuse. You will get an email in the next two days asking about your experience. I hope that your e-visit has been valuable and will speed your recovery.   I have spent 5 minutes in review of e-visit questionnaire, review and updating patient chart, medical decision making and response to patient.   Delon CHRISTELLA Dickinson, PA-C

## 2024-03-11 ENCOUNTER — Other Ambulatory Visit (HOSPITAL_COMMUNITY): Payer: Self-pay

## 2024-04-23 ENCOUNTER — Encounter: Admitting: Internal Medicine

## 2024-04-25 ENCOUNTER — Ambulatory Visit (INDEPENDENT_AMBULATORY_CARE_PROVIDER_SITE_OTHER)

## 2024-04-25 DIAGNOSIS — Z23 Encounter for immunization: Secondary | ICD-10-CM

## 2024-04-25 NOTE — Progress Notes (Signed)
 Patient visits today to receive her FLU injection/vaccine. Patient was informed and tolerated well. Patient was notified to reach out to us  if needed.

## 2024-05-01 ENCOUNTER — Ambulatory Visit: Admitting: Family Medicine

## 2024-05-07 ENCOUNTER — Other Ambulatory Visit (HOSPITAL_BASED_OUTPATIENT_CLINIC_OR_DEPARTMENT_OTHER): Payer: Self-pay

## 2024-05-07 ENCOUNTER — Ambulatory Visit: Admitting: Internal Medicine

## 2024-05-07 ENCOUNTER — Encounter: Payer: Self-pay | Admitting: Internal Medicine

## 2024-05-07 ENCOUNTER — Other Ambulatory Visit: Payer: Self-pay

## 2024-05-07 VITALS — BP 110/60 | HR 82 | Temp 99.0°F | Ht 65.0 in | Wt 156.8 lb

## 2024-05-07 DIAGNOSIS — Z975 Presence of (intrauterine) contraceptive device: Secondary | ICD-10-CM

## 2024-05-07 DIAGNOSIS — E538 Deficiency of other specified B group vitamins: Secondary | ICD-10-CM | POA: Diagnosis not present

## 2024-05-07 DIAGNOSIS — Z0000 Encounter for general adult medical examination without abnormal findings: Secondary | ICD-10-CM

## 2024-05-07 DIAGNOSIS — F418 Other specified anxiety disorders: Secondary | ICD-10-CM

## 2024-05-07 DIAGNOSIS — E559 Vitamin D deficiency, unspecified: Secondary | ICD-10-CM

## 2024-05-07 DIAGNOSIS — Z13220 Encounter for screening for lipoid disorders: Secondary | ICD-10-CM | POA: Diagnosis not present

## 2024-05-07 LAB — CBC
HCT: 38.2 % (ref 36.0–46.0)
Hemoglobin: 12.4 g/dL (ref 12.0–15.0)
MCHC: 32.4 g/dL (ref 30.0–36.0)
MCV: 84.8 fl (ref 78.0–100.0)
Platelets: 346 K/uL (ref 150.0–400.0)
RBC: 4.5 Mil/uL (ref 3.87–5.11)
RDW: 13.3 % (ref 11.5–15.5)
WBC: 7.5 K/uL (ref 4.0–10.5)

## 2024-05-07 LAB — LIPID PANEL
Cholesterol: 149 mg/dL (ref 0–200)
HDL: 57.5 mg/dL (ref >39.00)
LDL Cholesterol: 83 mg/dL (ref 0–99)
NonHDL: 91.68
Total CHOL/HDL Ratio: 3
Triglycerides: 45 mg/dL (ref 0.0–149.0)
VLDL: 9 mg/dL (ref 0.0–40.0)

## 2024-05-07 LAB — COMPREHENSIVE METABOLIC PANEL WITH GFR
ALT: 8 U/L (ref 0–35)
AST: 16 U/L (ref 0–37)
Albumin: 4.4 g/dL (ref 3.5–5.2)
Alkaline Phosphatase: 72 U/L (ref 39–117)
BUN: 10 mg/dL (ref 6–23)
CO2: 28 meq/L (ref 19–32)
Calcium: 9.1 mg/dL (ref 8.4–10.5)
Chloride: 102 meq/L (ref 96–112)
Creatinine, Ser: 0.74 mg/dL (ref 0.40–1.20)
GFR: 103.43 mL/min (ref >60.00)
Glucose, Bld: 81 mg/dL (ref 70–99)
Potassium: 4 meq/L (ref 3.5–5.1)
Sodium: 137 meq/L (ref 135–145)
Total Bilirubin: 0.6 mg/dL (ref 0.2–1.2)
Total Protein: 7.8 g/dL (ref 6.0–8.3)

## 2024-05-07 LAB — FOLLICLE STIMULATING HORMONE: FSH: 4 m[IU]/mL

## 2024-05-07 LAB — TSH: TSH: 0.44 u[IU]/mL (ref 0.35–5.50)

## 2024-05-07 LAB — VITAMIN B12: Vitamin B-12: 308 pg/mL (ref 211–911)

## 2024-05-07 LAB — VITAMIN D 25 HYDROXY (VIT D DEFICIENCY, FRACTURES): VITD: 24.36 ng/mL — ABNORMAL LOW (ref 30.00–100.00)

## 2024-05-07 MED ORDER — HYDROXYZINE HCL 10 MG PO TABS
10.0000 mg | ORAL_TABLET | Freq: Three times a day (TID) | ORAL | 0 refills | Status: AC | PRN
  Filled 2024-05-07: qty 20, 4d supply, fill #0

## 2024-05-07 NOTE — Progress Notes (Signed)
" ° °  Subjective:   Patient ID: Sherry Sheppard, female    DOB: August 03, 1985, 38 y.o.   MRN: [id number]  The patient is here for physical. Pertinent topics discussed: Discussed the use of AI scribe software for clinical note transcription with the patient, who gave verbal consent to proceed. History of Present Illness Sherry Sheppard is a 38 year old female who presents with symptoms suggestive of perimenopause.  She is experiencing night sweats, hot flashes, and vaginal dryness. She has an appointment with her gynecologist for further evaluation. An IUD has been in place for five years. Her mother experienced early menopause but had a hysterectomy, which may have influenced the timing.  She experiences occasional heartburn, associated with certain food triggers and previous excessive use of ibuprofen  for migraines. She has had two significant episodes of gastroesophageal reflux disease (GERD) in the past year, managed by identifying and avoiding triggers. She is currently taking Protonix , which has been helpful in managing her symptoms. No new diarrhea or constipation.  In terms of lifestyle, she acknowledges a decrease in physical activity since starting a new job that involves less movement. She is working on increasing her activity levels.Her family includes a five-year-old in kindergarten, an eleven-year-old, and a sixteen-year-old, and she notes the challenges of managing teenagers and their activities.  PMH, [facility], social history reviewed and updated  Review of Systems  Constitutional: Negative.   HENT: Negative.    Eyes: Negative.   Respiratory:  Negative for cough, chest tightness and shortness of breath.   Cardiovascular:  Negative for chest pain, palpitations and leg swelling.  Gastrointestinal:  Negative for abdominal distention, abdominal pain, constipation, diarrhea, nausea and vomiting.  Musculoskeletal: Negative.   Skin: Negative.   Neurological: Negative.    Psychiatric/Behavioral: Negative.      Objective:  Physical Exam Constitutional:      Appearance: She is well-developed.  HENT:     Head: Normocephalic and atraumatic.  Cardiovascular:     Rate and Rhythm: Normal rate and regular rhythm.  Pulmonary:     Effort: Pulmonary effort is normal. No respiratory distress.     Breath sounds: Normal breath sounds. No wheezing or rales.  Abdominal:     General: Bowel sounds are normal. There is no distension.     Palpations: Abdomen is soft.     Tenderness: There is no abdominal tenderness.  Musculoskeletal:     Cervical back: Normal range of motion.  Skin:    General: Skin is warm and dry.  Neurological:     Mental Status: She is alert and oriented to person, place, and time.     Coordination: Coordination normal.     Vitals:   05/07/24 0759  BP: 110/60  Pulse: 82  Temp: 99 F (37.2 C)  TempSrc: Temporal  SpO2: 99%  Weight: 156 lb 12.8 oz (71.1 kg)  Height: 5' 5 (1.651 m)    Assessment & Plan:   "

## 2024-05-08 NOTE — Assessment & Plan Note (Signed)
 Uses hydroxyzine  prn and stable overall.

## 2024-05-08 NOTE — Assessment & Plan Note (Signed)
Checking B12 and adjust as needed. 

## 2024-05-08 NOTE — Assessment & Plan Note (Signed)
Checking vitamin D level and adjust as needed.  

## 2024-05-08 NOTE — Assessment & Plan Note (Signed)
 Checking FSH with concern for symptoms. They may be related to near end of life IUD.

## 2024-05-08 NOTE — Assessment & Plan Note (Signed)
 Flu shot yearly. Tetanus up to date, pap smear up to date. Counseled about sun safety and mole surveillance. Counseled about the dangers of distracted driving. Given 10 year screening recommendations.

## 2024-05-09 ENCOUNTER — Ambulatory Visit: Payer: Self-pay | Admitting: Internal Medicine

## 2024-05-22 ENCOUNTER — Other Ambulatory Visit: Payer: Self-pay

## 2024-05-22 ENCOUNTER — Ambulatory Visit: Admitting: Obstetrics

## 2024-05-22 ENCOUNTER — Encounter: Payer: Self-pay | Admitting: Obstetrics

## 2024-05-22 ENCOUNTER — Other Ambulatory Visit (HOSPITAL_COMMUNITY): Payer: Self-pay

## 2024-05-22 ENCOUNTER — Other Ambulatory Visit (HOSPITAL_COMMUNITY)
Admission: RE | Admit: 2024-05-22 | Discharge: 2024-05-22 | Disposition: A | Source: Ambulatory Visit | Attending: Obstetrics | Admitting: Obstetrics

## 2024-05-22 VITALS — BP 110/73 | HR 87 | Ht 65.0 in | Wt 156.0 lb

## 2024-05-22 DIAGNOSIS — N898 Other specified noninflammatory disorders of vagina: Secondary | ICD-10-CM

## 2024-05-22 DIAGNOSIS — T8332XA Displacement of intrauterine contraceptive device, initial encounter: Secondary | ICD-10-CM

## 2024-05-22 DIAGNOSIS — Z01419 Encounter for gynecological examination (general) (routine) without abnormal findings: Secondary | ICD-10-CM

## 2024-05-22 DIAGNOSIS — N946 Dysmenorrhea, unspecified: Secondary | ICD-10-CM

## 2024-05-22 DIAGNOSIS — R6882 Decreased libido: Secondary | ICD-10-CM | POA: Diagnosis not present

## 2024-05-22 MED ORDER — IBUPROFEN 800 MG PO TABS
800.0000 mg | ORAL_TABLET | Freq: Three times a day (TID) | ORAL | 5 refills | Status: AC | PRN
  Filled 2024-05-22: qty 30, 10d supply, fill #0

## 2024-05-22 NOTE — Progress Notes (Signed)
 Subjective:        Sherry Sheppard is a 38 y.o. female here for a routine exam.  Current complaints: Low libido, vaginal dryness and hot flashes.    Personal health questionnaire:  Is patient [name], have a family history of breast and/or ovarian cancer: no Is there a family history of uterine cancer diagnosed at age < 58, gastrointestinal cancer, urinary tract cancer, family member who is a Personnel Officer syndrome-associated carrier: no Is the patient overweight and hypertensive, family history of diabetes, personal history of gestational diabetes, preeclampsia or PCOS: no Is patient over 30, have PCOS,  family history of premature CHD under age 56, diabetes, smoke, have hypertension or peripheral artery disease:  no At any time, has a partner hit, kicked or otherwise hurt or frightened you?: no Over the past 2 weeks, have you felt down, depressed or hopeless?: no Over the past 2 weeks, have you felt little interest or pleasure in doing things?:no   Gynecologic History No LMP recorded. (Menstrual status: IUD). Contraception: IUD Last Pap: 2023. Results were: normal Last mammogram: 2024. Results were: normal  Obstetric History OB History  Gravida Para Term Preterm AB Living  4 2 2  1 2   SAB IAB Ectopic Multiple Live Births  1    2    # Outcome Date GA Lbr Len/2nd Weight Sex Type Anes PTL Lv  4 Gravida           3 Term 08/24/12 [redacted]w[redacted]d  8 lb 0.8 oz (3.65 kg) M CS-Vac Spinal  LIV     Birth Comments: had seizure during last CS had an AVM completely resolved as of 6/19 had surgery 12/2012  2 SAB 2013             Birth Comments: System Generated. Please review and update pregnancy details.  1 Term 04/07/08    M CS-LTranv   LIV     Birth Comments: c/S because FTP and fetal distress    Past Medical History:  Diagnosis Date   Allergic rhinitis    Allergy    Bananas, contrast dye,procardia    AVM (arteriovenous malformation) brain    Migraines    Seizures (HCC)    last  event 08/24/12   Vaginal Pap smear, abnormal     Past Surgical History:  Procedure Laterality Date   BRAIN AVM REPAIR  12/2012   gamma radio knife @ [place]   CESAREAN SECTION  2009   CESAREAN SECTION N/A 08/24/2012   Procedure: CESAREAN SECTION;  Surgeon: Aida DELENA Na, MD;  Location: [facility];  Service: Obstetrics;  Laterality: N/A;   CESAREAN SECTION N/A 11/10/2018   Procedure: CESAREAN SECTION;  Surgeon: Lorence Ozell CROME, MD;  Location: [facility];  Service: Obstetrics;  Laterality: N/A;   DILATION AND EVACUATION  09/20/2011   Procedure: DILATATION AND EVACUATION;  Surgeon: Carlin DELENA Centers, MD;  Location: [facility];  Service: Gynecology;  Laterality: N/A;   TOOTH EXTRACTION  2012     Current Outpatient Medications:    ALTRENO  0.05 % LOTN, Apply pea-sized amount to face at bedtime, wash off in morning., Disp: 20 g, Rfl: 3   clindamycin  (CLEOCIN  T) 1 % SWAB, Apply to face every morning., Disp: 60 each, Rfl: 3   cyanocobalamin  (VITAMIN B12) 1000 MCG tablet, Take 1 tablet (1,000 mcg total) by mouth daily., Disp: 90 tablet, Rfl: 3   fluticasone  (FLONASE ) 50 MCG/ACT nasal spray, Place 2 sprays into both nostrils daily., Disp: 16 g, Rfl:  6   hydrOXYzine  (ATARAX ) 10 MG tablet, Take 1-2 tablets (10-20 mg total) by mouth 3 (three) times daily as needed for anxiety., Disp: 20 tablet, Rfl: 0   levonorgestrel  (MIRENA ) 20 MCG/24HR IUD, 1 each by Intrauterine route once., Disp: , Rfl:    loratadine  (CLARITIN ) 10 MG tablet, Take 1 tablet (10 mg total) by mouth daily., Disp: 30 tablet, Rfl: 11   pantoprazole  (PROTONIX ) 40 MG tablet, Take 1 tablet (40 mg total) by mouth daily., Disp: 90 tablet, Rfl: 3   Vitamin D , Ergocalciferol , (DRISDOL ) 1.25 MG (50000 UNIT) CAPS capsule, Take 1 capsule by mouth every 7 days., Disp: 12 capsule, Rfl: 0   HYDROcodone  bit-homatropine (HYCODAN) 5-1.5 MG/5ML syrup, Take 5 mLs by mouth every 8 (eight) hours as needed for cough. (Patient not taking: Reported on 05/07/2024), Disp:  120 mL, Rfl: 0   ibuprofen  (ADVIL ) 800 MG tablet, Take 1 tablet (800 mg total) by mouth every 8 (eight) hours as needed., Disp: 30 tablet, Rfl: 5 Allergies  Allergen Reactions   Banana Shortness Of Breath   Gadobenate Other (See Comments)    The patient states a previous history of nausea with MRI contrast.  I administered a half dose contrast by hand very slowly and she had no issues.   Other Nausea And Vomiting    Contrast dye   Procardia  [Nifedipine ] Palpitations    Social History   Tobacco Use   Smoking status: Never   Smokeless tobacco: Never  Substance Use Topics   Alcohol use: Yes    Comment: occ    Family History  Problem Relation Age of Onset   Hyperlipidemia Mother    Diabetes Mother    Hypertension Father    Lung cancer Maternal Grandmother 88   Cancer Maternal Grandmother    Hypertension Maternal Aunt    Diabetes Cousin    ADD / ADHD Son    Asthma Brother       Review of Systems  Constitutional: negative for fatigue and weight loss Respiratory: negative for cough and wheezing Cardiovascular: negative for chest pain, fatigue and palpitations Gastrointestinal: negative for abdominal pain and change in bowel habits Musculoskeletal:negative for myalgias Neurological: negative for gait problems and tremors Behavioral/Psych: negative for abusive relationship, depression Endocrine: negative for temperature intolerance    Genitourinary:negative for abnormal menstrual periods, genital lesions, hot flashes, sexual problems and vaginal discharge Integument/breast: negative for breast lump, breast tenderness, nipple discharge and skin lesion(s)    Objective:       BP 110/73   Pulse 87   Ht 5' 5 (1.651 m)   Wt 156 lb (70.8 kg)   BMI 25.96 kg/m  General:   Alert and no distress  Skin:   no rash or abnormalities  Lungs:   clear to auscultation bilaterally  Heart:   regular rate and rhythm, S1, S2 normal, no murmur, click, rub or gallop  Breasts:   normal  without suspicious masses, skin or nipple changes or axillary nodes  Abdomen:  normal findings: no organomegaly, soft, non-tender and no hernia  Pelvis:  External genitalia: normal general appearance Urinary system: urethral meatus normal and bladder without fullness, nontender Vaginal: normal without tenderness, induration or masses Cervix: normal appearance Adnexa: normal bimanual exam Uterus: anteverted and non-tender, normal size   Lab Review Urine pregnancy test Labs reviewed yes Radiologic studies reviewed yes    Assessment:    1. Encounter for gynecological examination with Papanicolaou smear of cervix (Primary) Rx: - Cytology - PAP( [place])  2. Vaginal discharge Rx: - Cervicovaginal ancillary only( [place]) - RPR - Hepatitis C Antibody - Hepatitis B surface antigen - HIV antibody (with reflex)  3. Dysmenorrhea Rx: - ibuprofen  (ADVIL ) 800 MG tablet; Take 1 tablet (800 mg total) by mouth every 8 (eight) hours as needed.  Dispense: 30 tablet; Refill: 5  4. Low libido - patient will try some of the natural remedies ( educational material dispensed )  5. Intrauterine contraceptive device threads lost, initial encounter Rx: - US  PELVIC COMPLETE WITH TRANSVAGINAL; Future     Plan:    Education reviewed: calcium supplements, depression evaluation, low fat, low cholesterol diet, safe sex/STD prevention, self breast exams, and weight bearing exercise. Contraception: IUD. Follow up in: 1 year.   Meds ordered this encounter  Medications   ibuprofen  (ADVIL ) 800 MG tablet    Sig: Take 1 tablet (800 mg total) by mouth every 8 (eight) hours as needed.    Dispense:  30 tablet    Refill:  5   Orders Placed This Encounter  Procedures   US  PELVIC COMPLETE WITH TRANSVAGINAL    AETNA ID#: [id number] EPIC WT: 156 NO NEEDS NO DEVICES PT AWARE 75 NO SHOW FEE KG AND PT PT AWARE DRINK 32OZ OF WATER, 1HR PRIOR    Standing Status:   Future    Expected Date:    11/19/2024    Expiration Date:   05/22/2025    Reason for Exam (SYMPTOM  OR DIAGNOSIS REQUIRED):   IUD strings lost.    Preferred imaging location?:   [facility]   RPR   Hepatitis C Antibody   Hepatitis B surface antigen   HIV antibody (with reflex)    CARLIN RONAL CENTERS, MD, FACOG Attending Obstetrician & Gynecologist, [facility] for [facility], [facility], [place] 05/22/2024

## 2024-05-22 NOTE — Progress Notes (Signed)
 Having breakthrough bleeding now since last month for few day. Slight cramping but goes away. Mirena  in place. Plans on staying on Mirena . Thinking about having another pregnancy. Wants to discuss. Having some issues with hormonal changes. Had labs done with PCP which were normal. Wants full STI testing done as well. Last PAP 2023. Had mammo done last yea. Told dense tissue. Repeat in one year. PHQ 2 GAD 0

## 2024-05-23 LAB — CERVICOVAGINAL ANCILLARY ONLY
Bacterial Vaginitis (gardnerella): NEGATIVE
Chlamydia: NEGATIVE
Comment: NEGATIVE
Comment: NEGATIVE
Comment: NEGATIVE
Comment: NORMAL
Neisseria Gonorrhea: NEGATIVE
Trichomonas: NEGATIVE

## 2024-05-23 LAB — HEPATITIS B SURFACE ANTIGEN: Hepatitis B Surface Ag: NEGATIVE

## 2024-05-23 LAB — HIV ANTIBODY (ROUTINE TESTING W REFLEX): HIV Screen 4th Generation wRfx: NONREACTIVE

## 2024-05-23 LAB — RPR: RPR Ser Ql: NONREACTIVE

## 2024-05-23 LAB — HEPATITIS C ANTIBODY: Hep C Virus Ab: NONREACTIVE

## 2024-05-26 LAB — CYTOLOGY - PAP
Comment: NEGATIVE
Diagnosis: NEGATIVE
High risk HPV: NEGATIVE

## 2024-05-27 ENCOUNTER — Other Ambulatory Visit (HOSPITAL_COMMUNITY): Payer: Self-pay

## 2024-05-29 ENCOUNTER — Ambulatory Visit
Admission: RE | Admit: 2024-05-29 | Discharge: 2024-05-29 | Disposition: A | Discharge status: Home / Self Care | Source: Ambulatory Visit | Attending: Obstetrics | Admitting: Obstetrics

## 2024-05-29 DIAGNOSIS — T8332XA Displacement of intrauterine contraceptive device, initial encounter: Secondary | ICD-10-CM

## 2024-06-01 ENCOUNTER — Ambulatory Visit: Payer: Self-pay | Admitting: Obstetrics

## 2024-06-18 ENCOUNTER — Ambulatory Visit: Admitting: Obstetrics and Gynecology

## 2024-06-18 ENCOUNTER — Encounter: Payer: Self-pay | Admitting: Obstetrics and Gynecology

## 2024-06-18 VITALS — BP 117/81 | HR 82 | Ht 65.0 in | Wt 157.0 lb

## 2024-06-18 DIAGNOSIS — Z30432 Encounter for removal of intrauterine contraceptive device: Secondary | ICD-10-CM | POA: Diagnosis not present

## 2024-06-18 NOTE — Progress Notes (Signed)
° ° °  GYNECOLOGY OFFICE PROCEDURE NOTE  Sherry Sheppard is a 38 y.o. [phone] here for IUD removal. No GYN concerns.  Last pap smear was on 05/22/24 and was normal.  IUD Removal  Patient identified, informed consent performed, consent signed.  Patient was in the dorsal lithotomy position, normal external genitalia was noted.  A speculum was placed in the patient's vagina, normal discharge was noted, no lesions. The cervix was visualized, no lesions, no abnormal discharge. The cervix was prepped with 3 swabs of betadine. The strings were not visualized and could not be brought out of the cervix with the cytobrush. Alligator forceps were inserted into the endocervical canal, the IUD was grasped and removed in its entirety in one attempt.   Patient tolerated the procedure well.    Patient plans for pregnancy soon and she was told to avoid teratogens, take PNV and folic acid.  Routine preventative health maintenance measures emphasized.  Kieth Carolin, MD Obstetrician & Gynecologist, [facility] for [employer], [facility]

## 2024-06-18 NOTE — Patient Instructions (Signed)
It is normal to have cramping and bleeding for the next 2-3 days. You should feel better every day.  Please call us if you have any severe pain, bleeding that soaks more than 1 pad in a hour, have fevers, or feel like you're going to pass out.  You can take tylenol '1000mg'$  every 8 hours and ibuprofen '800mg'$  every 8 hours as needed for pain. It is ok to take both at the same time.

## 2024-06-21 ENCOUNTER — Encounter: Payer: Self-pay | Admitting: Dermatology

## 2024-06-21 ENCOUNTER — Encounter: Payer: Self-pay | Admitting: Obstetrics and Gynecology

## 2024-06-24 NOTE — Telephone Encounter (Signed)
 She should stop using the altreno  and Eucerin Radiant Tone and not restart until she is done breastfeeding.   She can continue the clindamycin  swabs and her current cleansers.  She can try adding OTC Azleic acid from [facility] at nighttime if the clindaymcin alone is not enough

## 2024-07-28 ENCOUNTER — Other Ambulatory Visit: Payer: Self-pay

## 2024-07-28 ENCOUNTER — Other Ambulatory Visit (HOSPITAL_COMMUNITY): Payer: Self-pay

## 2024-10-22 ENCOUNTER — Ambulatory Visit: Admitting: Dermatology
